# Patient Record
Sex: Male | Born: 1950
Health system: Southern US, Community
[De-identification: ages and names within clinical notes are randomized; demographics above are authoritative.]

## PROBLEM LIST (undated history)

## (undated) DIAGNOSIS — I480 Paroxysmal atrial fibrillation: Secondary | ICD-10-CM

## (undated) DIAGNOSIS — M25512 Pain in left shoulder: Secondary | ICD-10-CM

## (undated) DIAGNOSIS — F0781 Postconcussional syndrome: Secondary | ICD-10-CM

## (undated) DIAGNOSIS — T7840XA Allergy, unspecified, initial encounter: Secondary | ICD-10-CM

## (undated) DIAGNOSIS — M75110 Incomplete rotator cuff tear or rupture of unspecified shoulder, not specified as traumatic: Secondary | ICD-10-CM

## (undated) DIAGNOSIS — H269 Unspecified cataract: Secondary | ICD-10-CM

## (undated) DIAGNOSIS — R011 Cardiac murmur, unspecified: Secondary | ICD-10-CM

## (undated) DIAGNOSIS — E785 Hyperlipidemia, unspecified: Secondary | ICD-10-CM

## (undated) DIAGNOSIS — I10 Essential (primary) hypertension: Secondary | ICD-10-CM

## (undated) DIAGNOSIS — K219 Gastro-esophageal reflux disease without esophagitis: Secondary | ICD-10-CM

## (undated) DIAGNOSIS — R561 Post traumatic seizures: Secondary | ICD-10-CM

## (undated) HISTORY — PX: BICEPS TENDON REPAIR: SHX566

## (undated) HISTORY — PX: NASAL SEPTUM SURGERY: SHX37

## (undated) HISTORY — PX: TONSILECTOMY/ADENOIDECTOMY WITH MYRINGOTOMY: SHX6125

## (undated) HISTORY — DX: Hyperlipidemia, unspecified: E78.5

## (undated) HISTORY — PX: UPPER GASTROINTESTINAL ENDOSCOPY: SHX188

## (undated) HISTORY — DX: Cardiac murmur, unspecified: R01.1

## (undated) HISTORY — DX: Postconcussional syndrome: F07.81

## (undated) HISTORY — PX: CATARACT EXTRACTION: SUR2

## (undated) HISTORY — DX: Paroxysmal atrial fibrillation: I48.0

## (undated) HISTORY — PX: OTHER SURGICAL HISTORY: SHX169

## (undated) HISTORY — DX: Gastro-esophageal reflux disease without esophagitis: K21.9

## (undated) HISTORY — PX: COLONOSCOPY: SHX174

## (undated) HISTORY — DX: Allergy, unspecified, initial encounter: T78.40XA

## (undated) HISTORY — DX: Unspecified cataract: H26.9

---

## 1998-01-08 ENCOUNTER — Ambulatory Visit (HOSPITAL_BASED_OUTPATIENT_CLINIC_OR_DEPARTMENT_OTHER): Admission: RE | Admit: 1998-01-08 | Discharge: 1998-01-08 | Payer: Self-pay | Admitting: Orthopedic Surgery

## 2000-03-29 ENCOUNTER — Ambulatory Visit (HOSPITAL_COMMUNITY): Admission: RE | Admit: 2000-03-29 | Discharge: 2000-03-29 | Payer: Self-pay | Admitting: Anesthesiology

## 2000-03-29 ENCOUNTER — Encounter: Payer: Self-pay | Admitting: Anesthesiology

## 2000-04-13 ENCOUNTER — Encounter: Admission: RE | Admit: 2000-04-13 | Discharge: 2000-07-12 | Payer: Self-pay | Admitting: Anesthesiology

## 2000-05-10 ENCOUNTER — Ambulatory Visit (HOSPITAL_COMMUNITY): Admission: RE | Admit: 2000-05-10 | Discharge: 2000-05-10 | Payer: Self-pay | Admitting: Gastroenterology

## 2000-07-27 ENCOUNTER — Ambulatory Visit (HOSPITAL_COMMUNITY): Admission: RE | Admit: 2000-07-27 | Discharge: 2000-07-27 | Payer: Self-pay | Admitting: Gastroenterology

## 2008-01-02 ENCOUNTER — Ambulatory Visit: Payer: Self-pay | Admitting: Internal Medicine

## 2008-01-02 DIAGNOSIS — I10 Essential (primary) hypertension: Secondary | ICD-10-CM | POA: Insufficient documentation

## 2008-01-02 DIAGNOSIS — E785 Hyperlipidemia, unspecified: Secondary | ICD-10-CM | POA: Insufficient documentation

## 2008-01-03 ENCOUNTER — Encounter: Payer: Self-pay | Admitting: Internal Medicine

## 2008-01-11 ENCOUNTER — Encounter: Payer: Self-pay | Admitting: Internal Medicine

## 2008-03-05 ENCOUNTER — Ambulatory Visit (HOSPITAL_COMMUNITY): Admission: RE | Admit: 2008-03-05 | Discharge: 2008-03-08 | Payer: Self-pay | Admitting: Internal Medicine

## 2009-08-11 ENCOUNTER — Encounter: Payer: Self-pay | Admitting: Internal Medicine

## 2009-08-12 ENCOUNTER — Ambulatory Visit: Payer: Self-pay | Admitting: Internal Medicine

## 2009-08-12 LAB — CONVERTED CEMR LAB
ALT: 15 units/L (ref 0–53)
AST: 21 units/L (ref 0–37)
Albumin: 4.1 g/dL (ref 3.5–5.2)
Alkaline Phosphatase: 45 units/L (ref 39–117)
BUN: 15 mg/dL (ref 6–23)
Basophils Absolute: 0 10*3/uL (ref 0.0–0.1)
Basophils Relative: 0.2 % (ref 0.0–3.0)
Bilirubin Urine: NEGATIVE
Bilirubin, Direct: 0.1 mg/dL (ref 0.0–0.3)
Blood in Urine, dipstick: NEGATIVE
CO2: 32 meq/L (ref 19–32)
Calcium: 9.4 mg/dL (ref 8.4–10.5)
Chloride: 104 meq/L (ref 96–112)
Cholesterol: 313 mg/dL — ABNORMAL HIGH (ref 0–200)
Creatinine, Ser: 1.2 mg/dL (ref 0.4–1.5)
Direct LDL: 151.1 mg/dL
Eosinophils Absolute: 0.1 10*3/uL (ref 0.0–0.7)
Eosinophils Relative: 1.1 % (ref 0.0–5.0)
GFR calc non Af Amer: 66.28 mL/min (ref >60)
Glucose, Bld: 95 mg/dL (ref 70–99)
Glucose, Urine, Semiquant: NEGATIVE
HCT: 45.1 % (ref 39.0–52.0)
HDL: 141 mg/dL (ref >39.00)
Hemoglobin: 14.8 g/dL (ref 13.0–17.0)
Ketones, urine, test strip: NEGATIVE
Lymphocytes Relative: 17.7 % (ref 12.0–46.0)
Lymphs Abs: 1 10*3/uL (ref 0.7–4.0)
MCHC: 32.9 g/dL (ref 30.0–36.0)
MCV: 96.1 fL (ref 78.0–100.0)
Monocytes Absolute: 0.5 10*3/uL (ref 0.1–1.0)
Monocytes Relative: 9.9 % (ref 3.0–12.0)
Neutro Abs: 3.8 10*3/uL (ref 1.4–7.7)
Neutrophils Relative %: 71.1 % (ref 43.0–77.0)
Nitrite: NEGATIVE
PSA: 0.84 ng/mL (ref 0.10–4.00)
Platelets: 200 10*3/uL (ref 150.0–400.0)
Potassium: 4.4 meq/L (ref 3.5–5.1)
Protein, U semiquant: NEGATIVE
RBC: 4.69 M/uL (ref 4.22–5.81)
RDW: 12.8 % (ref 11.5–14.6)
Sodium: 141 meq/L (ref 135–145)
Specific Gravity, Urine: 1.02
TSH: 0.28 microintl units/mL — ABNORMAL LOW (ref 0.35–5.50)
Total Bilirubin: 1.1 mg/dL (ref 0.3–1.2)
Total CHOL/HDL Ratio: 2
Total Protein: 7.2 g/dL (ref 6.0–8.3)
Triglycerides: 77 mg/dL (ref 0.0–149.0)
Urobilinogen, UA: 0.2
VLDL: 15.4 mg/dL (ref 0.0–40.0)
WBC Urine, dipstick: NEGATIVE
WBC: 5.4 10*3/uL (ref 4.5–10.5)
pH: 5.5

## 2009-09-22 ENCOUNTER — Ambulatory Visit: Payer: Self-pay | Admitting: Internal Medicine

## 2009-09-22 DIAGNOSIS — I4891 Unspecified atrial fibrillation: Secondary | ICD-10-CM

## 2009-09-22 DIAGNOSIS — I480 Paroxysmal atrial fibrillation: Secondary | ICD-10-CM | POA: Insufficient documentation

## 2009-09-30 ENCOUNTER — Telehealth: Payer: Self-pay | Admitting: Internal Medicine

## 2009-11-17 ENCOUNTER — Ambulatory Visit: Payer: Self-pay | Admitting: Internal Medicine

## 2009-11-17 ENCOUNTER — Ambulatory Visit: Payer: Self-pay

## 2009-11-17 ENCOUNTER — Ambulatory Visit (HOSPITAL_COMMUNITY): Admission: RE | Admit: 2009-11-17 | Discharge: 2009-11-17 | Payer: Self-pay | Admitting: Cardiology

## 2009-11-17 ENCOUNTER — Encounter: Payer: Self-pay | Admitting: Cardiology

## 2009-11-19 ENCOUNTER — Ambulatory Visit: Payer: Self-pay | Admitting: Cardiology

## 2009-11-24 ENCOUNTER — Telehealth: Payer: Self-pay | Admitting: Internal Medicine

## 2009-12-02 ENCOUNTER — Encounter: Payer: Self-pay | Admitting: Internal Medicine

## 2009-12-02 ENCOUNTER — Ambulatory Visit (HOSPITAL_BASED_OUTPATIENT_CLINIC_OR_DEPARTMENT_OTHER): Admission: RE | Admit: 2009-12-02 | Discharge: 2009-12-02 | Payer: Self-pay | Admitting: Internal Medicine

## 2010-04-15 ENCOUNTER — Ambulatory Visit: Payer: Self-pay | Admitting: Internal Medicine

## 2010-04-15 LAB — CONVERTED CEMR LAB
ALT: 18 units/L (ref 0–53)
AST: 20 units/L (ref 0–37)
Albumin: 4.1 g/dL (ref 3.5–5.2)
Alkaline Phosphatase: 42 units/L (ref 39–117)
Bilirubin, Direct: 0.1 mg/dL (ref 0.0–0.3)
Total Bilirubin: 0.7 mg/dL (ref 0.3–1.2)
Total Protein: 6.6 g/dL (ref 6.0–8.3)

## 2010-04-23 ENCOUNTER — Encounter: Payer: Self-pay | Admitting: Internal Medicine

## 2010-05-12 ENCOUNTER — Encounter: Payer: Self-pay | Admitting: Internal Medicine

## 2010-06-08 ENCOUNTER — Ambulatory Visit (HOSPITAL_BASED_OUTPATIENT_CLINIC_OR_DEPARTMENT_OTHER): Admission: RE | Admit: 2010-06-08 | Discharge: 2010-06-08 | Payer: Self-pay | Admitting: Otolaryngology

## 2010-07-13 ENCOUNTER — Ambulatory Visit: Payer: Self-pay | Admitting: Internal Medicine

## 2010-09-06 LAB — CONVERTED CEMR LAB
Free T4: 0.7 ng/dL (ref 0.6–1.6)
T3, Free: 2.4 pg/mL (ref 2.3–4.2)

## 2010-09-08 NOTE — Assessment & Plan Note (Signed)
Summary: NP6/ AFIB/ APPT IS 4:15/ GD   Visit Type:  Initial Consult Primary Provider:  Birdie Sons MD  CC:  atrial fibrillation.  History of Present Illness: The patient is seen for the evaluation of atrial fibrillation.  He is a very healthy and active physician.  In 2001 the patient had a nuclear exercise test.  He exercised 15 mets.  There was 1 mm of ST depression.  The nuclear images were normal.  Ejection fraction was 62%.  A standard treadmill in 2005 for insurance purposes.  Exercise level was quite high.  EKGs revealed no significant abnormalities.   Patient has a high total cholesterol.  In the past his HDL was high and LDL was high.  A Lipo-Med was done showing large particle HDL (good) and large particle LDL (good).  The total LDL was increased.  At that time I chose not to start medications.  The patient has done well.  There is no chest pain.  Is not having any shortness of breath.  Within the past few months the patient has had several brief episodes of atrial fibrillation.  He has documented this with rhythm strips.  There is no obvious precipitating factor.  All of the episodes have occured  when he was at rest.  On several occasions he came home from work and relaxed with either a cup of water or glass of wine and developed atrial fibrillation.  He drinks one glass of wine approximately 5 evenings per week.  There is no relationship between this and the episodes of atrial fibrillation.  He has gone to sleep with the atrial fib and it is gone in the morning.  Recent labs have looked good.  His TSH is slightly below the normal range.  There is question of sleep apnea.  Current Medications (verified): 1)  Fish Oil 1000 Mg  Caps (Omega-3 Fatty Acids) .... Once Daily 2)  Aspirin 81 Mg Tabs (Aspirin) .... Once Daily 3)  Crestor 5 Mg  Tabs (Rosuvastatin Calcium) .... Take One Tablet By Mouth Every Other Day  Allergies (verified): 1)  ! Simvastatin  Past History:  Past Medical  History: Hyperlipidemia...high HDL.... high LDL... Lipo-med.. 2002.. very significant large HDL (good), LDL particle size large (good), low VLDL Hypertension Atrial fibrillation Hx of Inguinal herniorrhaphy, right Hx ofTonsillectomy Hx of Vasectomy--1987 Hx of knee arthroscopy, right and left 1990s Nuclear stress.... 2001...Marland Kitchen 1 mm inferior ST depression by EKG.... nuclear images normal... ejection fraction 62%.... 15 METs of exercise Question sleep apnea EF 55-60%... echo.Marland Kitchen November 17, 2009 Mitral valve   flat closure..... no definite mitral prolapse.... very mild MR.... echo... November 17, 2009 MR   very mild....echo  11/2009 AI    mild  ...echo.Marland Kitchen.11/2009 Thyroid.... TSH .Marland Kitchen slightly below normal range.... 2011 Family history coronary artery disease  Family History: Reviewed history from 09/22/2009 and no changes required. brother MI---age early 69s (smoker) father---MI age 38 (smoker)--renal cell CA (age 34) mother-helathy at 60 yo  Social History: Reviewed history from 01/02/2008 and no changes required. 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  All other systems are reviewed and are negative.  Vital Signs:  Patient profile:   60 year old male Height:      73.25 inches Weight:  184 pounds BMI:     24.20 Pulse rate:   65 / minute BP sitting:   126 / 82  (left arm) Cuff size:   regular  Vitals Entered By: Hardin Negus, RMA (November 19, 2009 4:12 PM)  Physical Exam  General:  patient is stable. Head:  head is atraumatic. Eyes:  no xanthelasma. Neck:  no jugular venous distention.  No carotid bruits. Chest Wall:  no chest wall tenderness.  There is slight prominence of one of his anterior  ribs on the right. Lungs:  lungs are clear.  Respiratory effort is nonlabored. Heart:  cardiac exam reveals S1-S2.  No significant murmurs heard. Abdomen:  abdomen is soft. Msk:  no musculoskeletal deformities. Extremities:  no peripheral edema. Skin:  no skin rashes. Psych:  patient is oriented to person time and place.  Affect is normal.   Impression & Recommendations:  Problem # 1:  * TSH LOW The patient's TSH is slightly below the normal range on 2 recent occasions.  I have chosen to send a free T3 and free T4 today.  This is done specifically because of his history of atrial fibrillation.  Problem # 2:  * FLAT CLOSURE OF THE MITRAL VALVE Echocardiogram was done on November 17, 2009.  There is excellent LV function.  There is flat  closure the mitral valve but no definite mitral valve prolapse.  There is insignificant mild MR.  There is mild aortic insufficiency with 3 aortic valve cusps.  The left atrium is 36 mm.  The aorta is 36 mm.  There is trace tricuspid regurgitation.  None of  these findings represent a significant abnormality.  Problem # 3:  * QUESTION SLEEP APNEA The patient says that he has had increased snoring.  I've asked him to look further into an evaluation for sleep apnea.  This can certainly be a risk factor for atrial fibrillation.  Problem # 4:  HYPERLIPIDEMIA (ICD-272.4)  His updated medication list for this problem includes:    Crestor 5 Mg Tabs (Rosuvastatin calcium) .Marland Kitchen... Take one tablet by mouth every other day The patient has very unusual lipids.  He has high HDL with large particle HDL.  He has increased LDL with large particle LDL.  He has been started on Crestor because of his family history.  Problem # 5:  ATRIAL FIBRILLATION (ICD-427.31)  His updated medication list for this problem includes:    Aspirin 81 Mg Tabs (Aspirin) ..... Once daily  Orders: TLB-T3, Free (Triiodothyronine) ([id number]) TLB-T4 (Thyrox), Free [phone]) The patient has  lone atrial fibrillation .  He has had several brief episodes.  He can feel the irregular beat but it does not cause him any problems.  He has gone to sleep with atrial fib on one or 2 occasions.  There is no obvious cause at this point.  It does not appear to be related to excess caffeine, cold medications, alcohol.  There is no significant structural heart disease.  His left atrial size is normal.  There is no indication for Coumadin.  The patient will continue normal activities.  If he has more episodes we will consider adding p.r.n. beta blocker or calcium blocker. He will remain on aspirin. He and I discussed many potential options that would follow but none of these are needed at this time. EKG is done today and reviewed by me.  There is normal sinus rhythm with a normal EKG.  Appended Document: NP6/ AFIB/ APPT IS 4:15/ GD Please put Dr.  Nicholes on call back list to see me in April 2012.  Appended Document: NP6/ AFIB/ APPT IS 4:15/ GD reminder put in IDX

## 2010-09-08 NOTE — Assessment & Plan Note (Signed)
Summary: cpx//ccm---PT RSC (BMP) // RS   Vital Signs:  Patient profile:   60 year old male Height:      73.25 inches Weight:      179 pounds BMI:     23.54 Pulse rate:   60 / minute Resp:     12 per minute BP sitting:   128 / 86  (left arm)  Vitals Entered By: Gladis Riffle, RN (September 22, 2009 10:07 AM) CC: cpx, labs done Is Patient Diabetic? No Comments had weight loss on simvastatin so stopped, weight returned to his normal   CC:  cpx and labs done.  History of Present Illness: cpx  see A/P  Preventive Screening-Counseling & Management  Alcohol-Tobacco     Smoking Status: never  Current Medications (verified): 1)  Fish Oil 1000 Mg  Caps (Omega-3 Fatty Acids) .... Once Daily 2)  Aspirin 81 Mg Tabs (Aspirin) .... Once Daily  Allergies (verified): 1)  ! Simvastatin  Past History:  Past Medical History: Hyperlipidemia Hypertension Atrial fibrillation  Family History: brother MI---age early 52s (smoker) father---MI age 52 (smoker)--renal cell CA (age 54) mother-helathy at 62 yo  Physical Exam  General:  alert and well-developed.   Head:  normocephalic and atraumatic.   Eyes:  pupils equal and pupils round.   Ears:  R ear normal and L ear normal.   Nose:  no external deformity and no external erythema.   Mouth:  good dentition, no gingival abnormalities, and no dental plaque.   Neck:  No deformities, masses, or tenderness noted. Chest Wall:  No deformities, masses, tenderness or gynecomastia noted. Lungs:  normal respiratory effort, no intercostal retractions, no accessory muscle use, and normal breath sounds.   Heart:  normal rate, regular rhythm, no murmur, no gallop, no rub, and no JVD.   Abdomen:  Bowel sounds positive,abdomen soft and non-tender without masses, organomegaly or hernias noted. Msk:  No deformity or scoliosis noted of thoracic or lumbar spine.   Pulses:  R radial normal, R carotid normal, L radial normal, and L carotid normal.     Extremities:  No clubbing, cyanosis, edema, or deformity noted with normal full range of motion of all joints.   Neurologic:  alert & oriented X3, cranial nerves II-XII intact, and gait normal.   Skin:  turgor normal and color normal.   Psych:  normally interactive and good eye contact.     Impression & Recommendations:  Problem # 1:  PREVENTIVE HEALTH CARE (ICD-V70.0) health maintenance issues are up-to-date.  Problem # 2:  ATRIAL FIBRILLATION (ICD-427.31) he has a rhythm strip documenting AFIb I had a long discussion with Dr. Gypsy Balsam.  He gives a great history for paroxysms of atrial fibrillation.  He is unable to relate these paroxysms to any behavior, caffeine intake.  He does not use over-the-counter sinus medications.  I think he needs further evaluation.  Robynn Pane needs an echocardiogram.  He asked about a stress test.  I don't think this will be helpful in his case.  He exercises at least 40-45 minutes 3 times a week.  He gets his heart rate up to 140 beats per minutefor at least 30 minutes.  I suspect he has lone atrial fibrillation.  I'd like him to be evaluated by cardiology.  He and I discussed the use of aspirin versus Coumadin.  I think aspirin for the time being is okay.  He also had questions about A. fib ablation.  At this time I don't think he is a  candidate for that. echo Dr. Myrtis Ser His updated medication list for this problem includes:    Aspirin 81 Mg Tabs (Aspirin) ..... Once daily  Problem # 3:  HYPERTENSION (ICD-401.9) see serial assessment form. no medications necessary at this time. BP today: 128/86 Prior BP: 132/90 (01/02/2008)  Labs Reviewed: K+: 4.4 (08/12/2009) Creat: : 1.2 (08/12/2009)   Chol: 313 (08/12/2009)   HDL: 141.00 (08/12/2009)   TG: 77.0 (08/12/2009)  Problem # 4:  HYPERLIPIDEMIA (ICD-272.4) I previously treated the patient with simvastatin.  While he was on that he had some vague achiness but more importantly he lost about 10 pounds.  He also  noticed subjective decreased strength.  When he stopped the medication his symptoms resolved.  He has an interesting lipid profile.  His cholesterol is remarkably high but his HDL is tremendously high.  If if a decision were to be made based just on his cholesterol profile I would not to treat.  However, he has a significant family history of coronary artery disease and therefore I think he should be treated.  I will order a lipoprotein electrophoresis.  We'll start Crestor 5 mg every other day.  Samples are given to him.  Side effects discussed.  Orders: T-Lipoprotien Electrophoresis ([id number])  Complete Medication List: 1)  Fish Oil 1000 Mg Caps (Omega-3 fatty acids) .... Once daily 2)  Aspirin 81 Mg Tabs (Aspirin) .... Once daily 3)  Crestor 5 Mg Tabs (Rosuvastatin calcium) .... Take one tablet by mouth every other day  Patient Instructions: 1)  lipids 272.4 2)  liver 995.2 in 6 weeks Prescriptions: CRESTOR 5 MG  TABS (ROSUVASTATIN CALCIUM) Take one tablet by mouth every other day  #90 x 3   Entered and Authorized by:   Birdie Sons MD   Signed by:   Birdie Sons MD on 09/23/2009   Method used:   Samples Given   RxID:   [id number]    Immunization History:  Tetanus/Td Immunization History:    Tetanus/Td:  historical (06/09/2009)  Influenza Immunization History:    Influenza:  historical (04/11/2009)

## 2010-09-08 NOTE — Progress Notes (Signed)
Summary: ? obstructive sleep apnea  Phone Note Call from Patient   Summary of Call: Nicholas Perry followed by cardiology for PAF, Family noting loud snore. Cardiology has raised possible connection between AF and OSA.  Hx tonsilectomy/ adenoidectomy. Always lean and otherwise in good health. We discussed sleep apnea evaluation and I will order split protocol sleep study based on above. Cell- [id number]  Initial call taken by: Waymon Budge MD,  November 24, 2009 5:29 PM  Follow-up for Phone Call        [facility] for pt to return my call to arrange for sleep study. [name]  November 25, 2009 9:14 AM Pt returned my call and we scheduled split night for May 26th. Pt was added to the cancellation list for the week of April 25th b/c pt has all this week off. Hopefully we can get him in earlier. Sleep packet faxed to pt's home fax. Pt aware to call me if he has any questions or concerns. [name]  November 25, 2009 10:19 AM

## 2010-09-08 NOTE — Procedures (Signed)
Summary: Colonoscopy, [facility]/Dr. Sharrell Ku  Colonoscopy, [facility]/Dr. Sharrell Ku   Imported By: Maryln Gottron 09/11/2009 13:22:11  _____________________________________________________________________  External Attachment:    Type:   Image     Comment:   External Document

## 2010-09-08 NOTE — Consult Note (Signed)
Summary: [facility]   [facility]   Imported By: Maryln Gottron 05/25/2010 09:19:53  _____________________________________________________________________  External Attachment:    Type:   Image     Comment:   External Document  Appended Document: [facility]  i have spoken with dr. Janyth Contes surgery

## 2010-09-08 NOTE — Progress Notes (Signed)
Summary: cardiology referral  Phone Note Call from Patient   Caller: Patient Call For: Birdie Sons MD Summary of Call: Pt is calling to ask about his referral for a cardiac work up with Dr. Myrtis Ser, and a echo?? [phone] Initial call taken by: Lynann Beaver CMA,  September 30, 2009 3:21 PM

## 2010-09-10 NOTE — Assessment & Plan Note (Signed)
Summary: fu per pt/njr   Vital Signs:  Patient profile:   60 year old male Height:      73.25 inches Weight:      182 pounds Temp:     98.6 degrees F Pulse rate:   72 / minute BP sitting:   122 / 98  (left arm)  Vitals Entered By: Jeremy Johann CMA (July 13, 2010 10:00 AM) CC: f/u lab results   Primary Care Zaelyn Noack:  Birdie Sons MD  CC:  f/u lab results.  History of Present Illness:  Follow-Up Visit      This is a 60 year old man who presents for Follow-up visit.  The patient denies chest pain and palpitations.  Since the last visit the patient notes no new problems or concerns.  The patient reports taking meds as prescribed.  When questioned about possible medication side effects, the patient notes none.    All other systems reviewed and were negative   Current Problems (verified): 1)  Flat Closure of The Mitral Valve  () 2)  Atrial Fibrillation  (ICD-427.31) 3)  Preventive Health Care  (ICD-V70.0) 4)  Hypertension  (ICD-401.9) 5)  Hyperlipidemia  (ICD-272.4)  Current Medications (verified): 1)  Fish Oil 1000 Mg  Caps (Omega-3 Fatty Acids) .... Once Daily 2)  Aspirin 81 Mg Tabs (Aspirin) .... Once Daily 3)  Crestor 5 Mg  Tabs (Rosuvastatin Calcium) .... Take One Tablet By Mouth Every Other Day  Allergies (verified): 1)  ! Simvastatin  Past History:  Past Medical History: Last updated: 11/19/2009 Hyperlipidemia...high HDL.... high LDL... Lipo-med.. 2002.. very significant large HDL (good), LDL particle size large (good), low VLDL Hypertension Atrial fibrillation Hx of Inguinal herniorrhaphy, right Hx ofTonsillectomy Hx of Vasectomy--1987 Hx of knee arthroscopy, right and left 1990s Nuclear stress.... 2001...Marland Kitchen 1 mm inferior ST depression by EKG.... nuclear images normal... ejection fraction 62%.... 15 METs of exercise Question sleep apnea EF 55-60%... echo.Marland Kitchen November 17, 2009 Mitral valve   flat closure..... no definite mitral prolapse.... very mild MR....  echo... November 17, 2009 MR   very mild....echo  11/2009 AI    mild  ...echo.Marland Kitchen.11/2009 Thyroid.... TSH .Marland Kitchen slightly below normal range.... 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 HEENT exam atraumatic, normocephalic cardiac exam S1-S2 are regular. Extremities no clubbing cyanosis or edema.   Impression & Recommendations:  Problem # 1:  ATRIAL FIBRILLATION (ICD-427.31) by history. He has had cardiology evaluation and has had echocardiogram. Note borderline low TSH. His updated medication list for this problem includes:    Aspirin 81 Mg Tabs (Aspirin) ..... Once daily  Problem # 2:  HYPERLIPIDEMIA (ICD-272.4) patient has a remarkably elevated HDL. It's very unclear whether he should be treated or not. He, of course, would prefer not to be treated. I think it's fine for him to stop the Crestor we will then do alipo medprofile. This was discussed with him in detail. His updated medication list for this problem includes:    Crestor 5 Mg  Tabs (Rosuvastatin calcium) .Marland Kitchen... Take one tablet by mouth every other day  Labs Reviewed: SGOT: 20 (04/15/2010)   SGPT: 18 (04/15/2010)   HDL:141.00 (08/12/2009)  Chol:313 (08/12/2009)  Trig:77.0 (08/12/2009)  Complete Medication List: 1)  Fish Oil 1000 Mg Caps (Omega-3 fatty acids) .... Once daily 2)  Aspirin  81 Mg Tabs (Aspirin) .... Once daily 3)  Crestor 5 Mg Tabs (Rosuvastatin calcium) .... Take one tablet by mouth every other day   Orders Added: 1)  Est. Patient Level III [16109]

## 2010-10-21 LAB — POCT HEMOGLOBIN-HEMACUE: Hemoglobin: 15.2 g/dL (ref 13.0–17.0)

## 2010-12-25 NOTE — Procedures (Signed)
[facility]  Patient:    Nicholas Perry, Nicholas Perry                            MRN: [id number] Proc. Date: 05/10/00 Adm. Date:  [id number] Attending:  Deneen Harts CC:         Pricilla Handler, M.D., [facility] (PERSONAL)   Procedure Report  PROCEDURE:  Colonoscopy.  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  No family history of colorectal neoplasia.  No personal history of altered bowel movements.  DESCRIPTION OF PROCEDURE:  After reviewing the nature of the procedure with the patient including potential risks and complications, and after discussing alternative methods of diagnosis and treatment, informed consent was signed.  The patient was premedicated receiving IV sedation administered prior to and during the course of the procedure totalling Versed 8 mg, fentanyl 100 mcg.  Using an Olympus pediatric PCF-140L video colonoscope, rectum was intubated after normal digital examination revealing no evidence of perianal or intrarectal pathology.  The scope was advanced around the colon, encountering difficulty only at the level of the splenic flexure which was acutely angulated.  The remainder of the colon was easily intubated.  The cecum identified by the appendiceal orifice and ileocecal valve.  The preparation was excellent throughout.  The scope was slowly withdrawn with careful inspection throughout the entire colon in a retrograde manner including retroflex view in the rectal vault.  The only abnormalities noted were internal hemorrhoids on retroflex view.  These were noninflamed.  The colon was decompressed and scope withdrawn.  The patient tolerated the procedure without difficulty being maintained on Datascope monitor and low-flow oxygen throughout.  Time 2, technical 2, preparation 1, total score = 5.  ASSESSMENT: 1. Internal  hemorrhoids - small, probable cause of hematochezia. 2. No colorectal neoplasia.  RECOMMENDATIONS: 1. Rectal care p.r.n. 2. Repeat colonoscopy 10 years. 3. Annual Hemoccult. DD:  05/10/00 TD:  05/10/00 Job: [id number]

## 2010-12-25 NOTE — H&P (Signed)
[facility]  Patient:    Nicholas Perry, Paparella                         MRN: [id number] Adm. 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  The patient stated that he was doing well up until shortly after a skiing trip in February 2001 when he developed a deep, nagging discomfort which radiates to the sole of his left foot.  He had been skiing in heavy snow.  Since then, he noted that his discomfort is most pronounced after he has been up on his foot for long periods of time and seems to improve with exercise temporarily only to recur a couple of hours later.  Occasionally, in the morning, he will have discomfort, but it is more pronounced later in the day.  He denies any weakness of his foot.  He denied any bowel or bladder changes.  He has tried Vioxx from Dr. Eulah Pont which was not very helpful.  He stated that the pain has a quivering, prickly, hot, tingling, annoying, nagging type discomfort to it.  He does exercise regularly at least three times a week and does do stair stepper as well as elliptical machines and weights.  He has never had any previous type symptoms associated.  PAST MEDICAL HISTORY:  The patient does have a previous history of surgery on his knees bilaterally for meniscal tears.  The left one being associated with some arthritic changes.  The left one was performed in 1999.  The right in 1995.  CURRENT MEDICATIONS:  One aspirin a day, Pepcid occasionally.  ALLERGIES:  No known allergies.  PAST SURGICAL HISTORY:  Significant for tonsillectomy and right inguinal hernia repair as a child with left knee surgery in 1999 and right knee surgery in 1995.  FAMILY HISTORY:  Positive for coronary artery disease.  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  He does not smoke cigarettes and rarely  drinks alcohol.  REVIEW OF SYSTEMS:  Unrevealing.  PHYSICAL EXAMINATION:  VITAL SIGNS:  Blood pressure 118/78, heart rate 68, respiratory rate 16, O2 saturation 98%, pain level 5/10, temperature 97.7.  HEENT:  Head:  Normocephalic, atraumatic.  NEUROLOGIC:  Cranial nerves II-XII were grossly intact.  Deep tendon reflexes were symmetric in the upper and lower extremities with downgoing toes.  Motor was 5/5 with symmetric bulk and tone.  Sensory was intact to vibratory sense and pin prick.  Tinels sign at the left ankle was negative.  Pressure over the tibial nerve at the popliteal fossa elicited discomfort which tended to radiate more to the lateral aspect of the calf and down to the sole of the foot.  LABORATORY DATA:  An MRI was performed on August 21 which was interpreted by Dr. Frazier Richards as showing minimal disk protrusion to the right at L5-S1 with mild degenerative facet arthritic changes, but no left-sided changes.  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  DISPOSITION:  I discussed with the patient the fact that it does not look like his problem is necessarily coming from  his back and may be an early entrapment syndrome which he was relieved to find.  I advocated that we use pyridoxine. I also advocated we go ahead and do nerve conduction studies just to verify that it looks like it is coming from that area to make sure that there is no evidence of a disk problem that was not picked up by the MRI.  We will go ahead and schedule these in the ensuing weeks.  He was reassured today and encouraged to pay close attention to whether certain exercises exacerbated his discomfort. DD:  04/13/00 TD:  04/13/00 Job: [id number]

## 2011-05-07 LAB — LIPID PANEL
HDL: 123
Total CHOL/HDL Ratio: 2
Triglycerides: 52

## 2011-05-07 LAB — HEPATIC FUNCTION PANEL
ALT: 21
AST: 22
Albumin: 4.1
Indirect Bilirubin: 0.9
Total Protein: 6.7

## 2011-05-07 LAB — BASIC METABOLIC PANEL
BUN: 16
Chloride: 103
Potassium: 4.5

## 2011-05-07 LAB — HIGH SENSITIVITY CRP: CRP, High Sensitivity: 0.4

## 2011-05-07 LAB — CK: Total CK: 105

## 2011-05-07 LAB — C-REACTIVE PROTEIN: CRP: 0 — ABNORMAL LOW (ref <0.6)

## 2013-04-01 ENCOUNTER — Encounter (HOSPITAL_COMMUNITY): Payer: Self-pay | Admitting: Anesthesiology

## 2013-12-14 ENCOUNTER — Emergency Department (HOSPITAL_COMMUNITY): Payer: BC Managed Care – PPO

## 2013-12-14 ENCOUNTER — Inpatient Hospital Stay (HOSPITAL_COMMUNITY)
Admission: EM | Admit: 2013-12-14 | Discharge: 2013-12-17 | DRG: 100 | Disposition: A | Discharge status: Home / Self Care | Payer: BC Managed Care – PPO | Attending: General Surgery | Admitting: General Surgery

## 2013-12-14 ENCOUNTER — Encounter (HOSPITAL_COMMUNITY): Payer: Self-pay | Admitting: Neurology

## 2013-12-14 ENCOUNTER — Inpatient Hospital Stay (HOSPITAL_COMMUNITY): Payer: BC Managed Care – PPO

## 2013-12-14 DIAGNOSIS — T754XXA Electrocution, initial encounter: Secondary | ICD-10-CM

## 2013-12-14 DIAGNOSIS — E785 Hyperlipidemia, unspecified: Secondary | ICD-10-CM | POA: Diagnosis present

## 2013-12-14 DIAGNOSIS — IMO0002 Reserved for concepts with insufficient information to code with codable children: Secondary | ICD-10-CM | POA: Diagnosis present

## 2013-12-14 DIAGNOSIS — Z87898 Personal history of other specified conditions: Secondary | ICD-10-CM | POA: Diagnosis present

## 2013-12-14 DIAGNOSIS — S0990XA Unspecified injury of head, initial encounter: Secondary | ICD-10-CM

## 2013-12-14 DIAGNOSIS — I4891 Unspecified atrial fibrillation: Secondary | ICD-10-CM | POA: Diagnosis present

## 2013-12-14 DIAGNOSIS — T23029A Burn of unspecified degree of unspecified single finger (nail) except thumb, initial encounter: Secondary | ICD-10-CM | POA: Diagnosis present

## 2013-12-14 DIAGNOSIS — R569 Unspecified convulsions: Principal | ICD-10-CM

## 2013-12-14 DIAGNOSIS — I498 Other specified cardiac arrhythmias: Secondary | ICD-10-CM | POA: Diagnosis present

## 2013-12-14 DIAGNOSIS — W11XXXA Fall on and from ladder, initial encounter: Secondary | ICD-10-CM | POA: Diagnosis present

## 2013-12-14 DIAGNOSIS — W868XXA Exposure to other electric current, initial encounter: Secondary | ICD-10-CM | POA: Diagnosis present

## 2013-12-14 DIAGNOSIS — R561 Post traumatic seizures: Secondary | ICD-10-CM | POA: Diagnosis present

## 2013-12-14 DIAGNOSIS — R32 Unspecified urinary incontinence: Secondary | ICD-10-CM | POA: Diagnosis present

## 2013-12-14 DIAGNOSIS — W860XXA Exposure to domestic wiring and appliances, initial encounter: Secondary | ICD-10-CM | POA: Diagnosis present

## 2013-12-14 DIAGNOSIS — T24019A Burn of unspecified degree of unspecified thigh, initial encounter: Secondary | ICD-10-CM

## 2013-12-14 DIAGNOSIS — S060X9A Concussion with loss of consciousness of unspecified duration, initial encounter: Secondary | ICD-10-CM | POA: Diagnosis present

## 2013-12-14 DIAGNOSIS — T2005XA Burn of unspecified degree of scalp [any part], initial encounter: Secondary | ICD-10-CM

## 2013-12-14 DIAGNOSIS — W19XXXA Unspecified fall, initial encounter: Secondary | ICD-10-CM

## 2013-12-14 DIAGNOSIS — I10 Essential (primary) hypertension: Secondary | ICD-10-CM | POA: Diagnosis present

## 2013-12-14 DIAGNOSIS — G9340 Encephalopathy, unspecified: Secondary | ICD-10-CM

## 2013-12-14 DIAGNOSIS — Z8249 Family history of ischemic heart disease and other diseases of the circulatory system: Secondary | ICD-10-CM

## 2013-12-14 DIAGNOSIS — R413 Other amnesia: Secondary | ICD-10-CM | POA: Diagnosis present

## 2013-12-14 HISTORY — DX: Post traumatic seizures: R56.1

## 2013-12-14 LAB — I-STAT ARTERIAL BLOOD GAS, ED
Bicarbonate: 25.5 mEq/L — ABNORMAL HIGH (ref 20.0–24.0)
O2 SAT: 100 %
PCO2 ART: 41.9 mmHg (ref 35.0–45.0)
PO2 ART: 246 mmHg — AB (ref 80.0–100.0)
Patient temperature: 98.6
TCO2: 27 mmol/L (ref 0–100)
pH, Arterial: 7.392 (ref 7.350–7.450)

## 2013-12-14 LAB — CBC WITH DIFFERENTIAL/PLATELET
BASOS ABS: 0 10*3/uL (ref 0.0–0.1)
Basophils Relative: 1 % (ref 0–1)
EOS PCT: 0 % (ref 0–5)
Eosinophils Absolute: 0 10*3/uL (ref 0.0–0.7)
HEMATOCRIT: 40.8 % (ref 39.0–52.0)
HEMOGLOBIN: 13.6 g/dL (ref 13.0–17.0)
LYMPHS ABS: 1.1 10*3/uL (ref 0.7–4.0)
LYMPHS PCT: 18 % (ref 12–46)
MCH: 31 pg (ref 26.0–34.0)
MCHC: 33.3 g/dL (ref 30.0–36.0)
MCV: 92.9 fL (ref 78.0–100.0)
MONO ABS: 0.5 10*3/uL (ref 0.1–1.0)
MONOS PCT: 9 % (ref 3–12)
NEUTROS ABS: 4.4 10*3/uL (ref 1.7–7.7)
Neutrophils Relative %: 72 % (ref 43–77)
Platelets: 203 10*3/uL (ref 150–400)
RBC: 4.39 MIL/uL (ref 4.22–5.81)
RDW: 12.9 % (ref 11.5–15.5)
WBC: 6.1 10*3/uL (ref 4.0–10.5)

## 2013-12-14 LAB — PREPARE FRESH FROZEN PLASMA
UNIT DIVISION: 0
Unit division: 0

## 2013-12-14 LAB — TYPE AND SCREEN
ABO/RH(D): O POS
ANTIBODY SCREEN: NEGATIVE
UNIT DIVISION: 0
UNIT DIVISION: 0

## 2013-12-14 LAB — TROPONIN I: Troponin I: <0.3 ng/mL (ref <0.30)

## 2013-12-14 LAB — I-STAT CHEM 8, ED
BUN: 20 mg/dL (ref 6–23)
CALCIUM ION: 1.17 mmol/L (ref 1.13–1.30)
CHLORIDE: 105 meq/L (ref 96–112)
Creatinine, Ser: 1.4 mg/dL — ABNORMAL HIGH (ref 0.50–1.35)
GLUCOSE: 83 mg/dL (ref 70–99)
HCT: 43 % (ref 39.0–52.0)
Hemoglobin: 14.6 g/dL (ref 13.0–17.0)
Potassium: 4.2 mEq/L (ref 3.7–5.3)
Sodium: 143 mEq/L (ref 137–147)
TCO2: 24 mmol/L (ref 0–100)

## 2013-12-14 LAB — CK TOTAL AND CKMB (NOT AT ARMC)
CK, MB: 4 ng/mL (ref 0.3–4.0)
RELATIVE INDEX: 2.8 — AB (ref 0.0–2.5)
Total CK: 144 U/L (ref 7–232)

## 2013-12-14 LAB — COMPREHENSIVE METABOLIC PANEL
ALBUMIN: 3.7 g/dL (ref 3.5–5.2)
ALK PHOS: 49 U/L (ref 39–117)
ALT: 19 U/L (ref 0–53)
AST: 24 U/L (ref 0–37)
BILIRUBIN TOTAL: 0.4 mg/dL (ref 0.3–1.2)
BUN: 20 mg/dL (ref 6–23)
CHLORIDE: 107 meq/L (ref 96–112)
CO2: 26 meq/L (ref 19–32)
CREATININE: 1.2 mg/dL (ref 0.50–1.35)
Calcium: 9.1 mg/dL (ref 8.4–10.5)
GFR calc Af Amer: 74 mL/min — ABNORMAL LOW (ref >90)
GFR, EST NON AFRICAN AMERICAN: 64 mL/min — AB (ref >90)
Glucose, Bld: 84 mg/dL (ref 70–99)
POTASSIUM: 4.5 meq/L (ref 3.7–5.3)
Sodium: 146 mEq/L (ref 137–147)
Total Protein: 6.5 g/dL (ref 6.0–8.3)

## 2013-12-14 LAB — I-STAT CG4 LACTIC ACID, ED: LACTIC ACID, VENOUS: 1.06 mmol/L (ref 0.5–2.2)

## 2013-12-14 LAB — I-STAT TROPONIN, ED: TROPONIN I, POC: 0 ng/mL (ref 0.00–0.08)

## 2013-12-14 LAB — ABO/RH: ABO/RH(D): O POS

## 2013-12-14 LAB — MRSA PCR SCREENING: MRSA by PCR: NEGATIVE

## 2013-12-14 MED ORDER — SUCCINYLCHOLINE CHLORIDE 20 MG/ML IJ SOLN
INTRAMUSCULAR | Status: AC | PRN
  Administered 2013-12-14: 100 mg via INTRAVENOUS

## 2013-12-14 MED ORDER — FENTANYL CITRATE 0.05 MG/ML IJ SOLN
100.0000 ug | Freq: Once | INTRAMUSCULAR | Status: AC
  Administered 2013-12-14: 100 ug via INTRAVENOUS

## 2013-12-14 MED ORDER — LEVETIRACETAM 500 MG/5ML IV SOLN: 500.0000 mg | Freq: Two times a day (BID) | INTRAVENOUS | Status: DC

## 2013-12-14 MED ORDER — FENTANYL CITRATE 0.05 MG/ML IJ SOLN
INTRAMUSCULAR | Status: AC
  Filled 2013-12-14: qty 2

## 2013-12-14 MED ORDER — PANTOPRAZOLE SODIUM 40 MG IV SOLR
40.0000 mg | Freq: Every day | INTRAVENOUS | Status: DC
  Administered 2013-12-14 – 2013-12-15 (×2): 40 mg via INTRAVENOUS
  Filled 2013-12-14 (×4): qty 40

## 2013-12-14 MED ORDER — ETOMIDATE 2 MG/ML IV SOLN
INTRAVENOUS | Status: AC | PRN
  Administered 2013-12-14: 20 mg via INTRAVENOUS

## 2013-12-14 MED ORDER — LORAZEPAM 2 MG/ML IJ SOLN
INTRAMUSCULAR | Status: AC
  Filled 2013-12-14: qty 1

## 2013-12-14 MED ORDER — BIOTENE DRY MOUTH MT LIQD: 15.0000 mL | Freq: Four times a day (QID) | OROMUCOSAL | Status: DC

## 2013-12-14 MED ORDER — SODIUM CHLORIDE 0.9 % IV SOLN
1000.0000 mg | INTRAVENOUS | Status: AC
  Administered 2013-12-14: 1000 mg via INTRAVENOUS
  Filled 2013-12-14: qty 10

## 2013-12-14 MED ORDER — SODIUM CHLORIDE 0.9 % IV SOLN
10.0000 ug/h | INTRAVENOUS | Status: DC
  Filled 2013-12-14: qty 50

## 2013-12-14 MED ORDER — ONDANSETRON HCL 4 MG/2ML IJ SOLN: 4.0000 mg | Freq: Four times a day (QID) | INTRAMUSCULAR | Status: DC | PRN

## 2013-12-14 MED ORDER — PROPOFOL 10 MG/ML IV EMUL
5.0000 ug/kg/min | Freq: Once | INTRAVENOUS | Status: DC
  Administered 2013-12-14: 50 ug/kg/min via INTRAVENOUS

## 2013-12-14 MED ORDER — LORAZEPAM 2 MG/ML IJ SOLN
INTRAMUSCULAR | Status: AC | PRN
  Administered 2013-12-14: 2 mg via INTRAVENOUS

## 2013-12-14 MED ORDER — SODIUM CHLORIDE 0.9 % IV BOLUS (SEPSIS)
500.0000 mL | Freq: Once | INTRAVENOUS | Status: AC
  Administered 2013-12-14: 500 mL via INTRAVENOUS

## 2013-12-14 MED ORDER — IOHEXOL 300 MG/ML  SOLN
100.0000 mL | Freq: Once | INTRAMUSCULAR | Status: AC | PRN
  Administered 2013-12-14: 100 mL via INTRAVENOUS

## 2013-12-14 MED ORDER — PROPOFOL 10 MG/ML IV EMUL
INTRAVENOUS | Status: AC
  Filled 2013-12-14: qty 100

## 2013-12-14 MED ORDER — SODIUM CHLORIDE 0.9 % IV SOLN
INTRAVENOUS | Status: DC
  Administered 2013-12-14 – 2013-12-15 (×3): via INTRAVENOUS
  Administered 2013-12-16: 1 mL via INTRAVENOUS

## 2013-12-14 MED ORDER — PROPOFOL 10 MG/ML IV EMUL
5.0000 ug/kg/min | Freq: Once | INTRAVENOUS | Status: AC
  Filled 2013-12-14: qty 100

## 2013-12-14 MED ORDER — LORAZEPAM 2 MG/ML IJ SOLN: 2.0000 mg | Freq: Once | INTRAMUSCULAR | Status: DC

## 2013-12-14 MED ORDER — SODIUM CHLORIDE 0.9 % IV SOLN
500.0000 mg | Freq: Two times a day (BID) | INTRAVENOUS | Status: DC
  Administered 2013-12-15 – 2013-12-16 (×3): 500 mg via INTRAVENOUS
  Filled 2013-12-14 (×4): qty 5

## 2013-12-14 MED ORDER — CHLORHEXIDINE GLUCONATE 0.12 % MT SOLN: 15.0000 mL | Freq: Two times a day (BID) | OROMUCOSAL | Status: DC

## 2013-12-14 MED ORDER — ONDANSETRON HCL 4 MG PO TABS: 4.0000 mg | ORAL_TABLET | Freq: Four times a day (QID) | ORAL | Status: DC | PRN

## 2013-12-14 MED ORDER — SODIUM CHLORIDE 0.9 % IV SOLN
20.0000 ug/h | INTRAVENOUS | Status: DC
  Filled 2013-12-14: qty 50

## 2013-12-14 MED ORDER — PANTOPRAZOLE SODIUM 40 MG PO TBEC
40.0000 mg | DELAYED_RELEASE_TABLET | Freq: Every day | ORAL | Status: DC
  Administered 2013-12-16 – 2013-12-17 (×2): 40 mg via ORAL
  Filled 2013-12-14 (×2): qty 1

## 2013-12-14 MED ORDER — SODIUM CHLORIDE 0.9 % IV SOLN: 500.0000 mg | Freq: Once | INTRAVENOUS | Status: DC
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
## 2013-12-14 NOTE — ED Notes (Signed)
Patient transported to CT 

## 2013-12-14 NOTE — Procedures (Signed)
Extubation Procedure Note  Patient Details:   Name: Keelin Sheridan DOB: 03/25/1951 MRN: [id number]   Airway Documentation:     Evaluation  O2 sats: stable throughout Complications: No apparent complications Patient did tolerate procedure well. Bilateral Breath Sounds: Clear Suctioning: Airway Yes  Extubated to 3L [place] per Dr. Hulen Skains with RN at the bedside.   Eddie North Lexie Koehl 12/14/2013, 7:55 PM

## 2013-12-14 NOTE — Progress Notes (Signed)
Pt transported from ED to CT and back on vent. No complications noted.

## 2013-12-14 NOTE — ED Notes (Signed)
Report called to Kimberly, RN.
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
## 2013-12-14 NOTE — ED Notes (Signed)
Lab test results given to Sabra Heck, MD

## 2013-12-14 NOTE — ED Notes (Signed)
Per Dr. Hulen Skains once sedation weaned and patient is able to follow commands patient is to be extubated.

## 2013-12-14 NOTE — Progress Notes (Signed)
EEG Completed; Results Pending  
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
## 2013-12-14 NOTE — ED Notes (Signed)
OR staff titrated Propofol gtt from 50-100 mcg/kg/min.

## 2013-12-15 ENCOUNTER — Inpatient Hospital Stay (HOSPITAL_COMMUNITY): Payer: BC Managed Care – PPO

## 2013-12-15 ENCOUNTER — Encounter (HOSPITAL_COMMUNITY): Payer: Self-pay | Admitting: *Deleted

## 2013-12-15 DIAGNOSIS — R569 Unspecified convulsions: Principal | ICD-10-CM

## 2013-12-15 LAB — CBC
HEMATOCRIT: 42.9 % (ref 39.0–52.0)
Hemoglobin: 14.1 g/dL (ref 13.0–17.0)
MCH: 31 pg (ref 26.0–34.0)
MCHC: 32.9 g/dL (ref 30.0–36.0)
MCV: 94.3 fL (ref 78.0–100.0)
Platelets: 202 10*3/uL (ref 150–400)
RBC: 4.55 MIL/uL (ref 4.22–5.81)
RDW: 13.1 % (ref 11.5–15.5)
WBC: 11.5 10*3/uL — ABNORMAL HIGH (ref 4.0–10.5)

## 2013-12-15 LAB — BASIC METABOLIC PANEL
BUN: 13 mg/dL (ref 6–23)
CHLORIDE: 106 meq/L (ref 96–112)
CO2: 24 mEq/L (ref 19–32)
CREATININE: 0.87 mg/dL (ref 0.50–1.35)
Calcium: 8.6 mg/dL (ref 8.4–10.5)
GFR calc Af Amer: >90 mL/min (ref >90)
GFR calc non Af Amer: >90 mL/min (ref >90)
Glucose, Bld: 83 mg/dL (ref 70–99)
Potassium: 3.7 mEq/L (ref 3.7–5.3)
Sodium: 142 mEq/L (ref 137–147)

## 2013-12-15 MED ORDER — ACETAMINOPHEN 325 MG PO TABS
650.0000 mg | ORAL_TABLET | ORAL | Status: DC | PRN
  Administered 2013-12-15 – 2013-12-17 (×7): 650 mg via ORAL
  Filled 2013-12-15 (×7): qty 2

## 2013-12-15 MED ORDER — MORPHINE SULFATE 2 MG/ML IJ SOLN: 2.0000 mg | INTRAMUSCULAR | Status: DC | PRN

## 2013-12-15 MED ORDER — KETOROLAC TROMETHAMINE 15 MG/ML IJ SOLN
15.0000 mg | Freq: Three times a day (TID) | INTRAMUSCULAR | Status: DC | PRN
  Administered 2013-12-15 – 2013-12-16 (×2): 15 mg via INTRAVENOUS
  Filled 2013-12-15 (×2): qty 1

## 2013-12-15 MED ORDER — GADOBENATE DIMEGLUMINE 529 MG/ML IV SOLN
20.0000 mL | Freq: Once | INTRAVENOUS | Status: AC | PRN
  Administered 2013-12-15: 20 mL via INTRAVENOUS

## 2013-12-15 MED ORDER — KETOROLAC TROMETHAMINE 30 MG/ML IJ SOLN
30.0000 mg | Freq: Once | INTRAMUSCULAR | Status: AC
  Administered 2013-12-15: 30 mg via INTRAVENOUS
  Filled 2013-12-15: qty 1

## 2013-12-15 NOTE — Progress Notes (Signed)
Made Dr. Doy Mince aware of patient being confused now. She will come to bedside to assess patient.

## 2013-12-15 NOTE — Consult Note (Signed)
Reason for Consult: Rule out closed head injury status post fall Referring Physician: Dr., trauma  Nicholas Perry is an 63 y.o. male.  HPI: Ms. 63 year old individual who sustained a fall from a ladder while using the electric tremor. He may have been electrocuted as the tremor cord was transected. Some seizure activity was noted at the scene and here in the emergency room where the patient was admitted. Had seen him yesterday after admission and a CT scan of the brain revealed no acute intracranial injury. He is seen now in followup as he still has a cervical collar on. MRI of the brain has been performed along with an MRI of the C-spine. The MRI of the brain is essentially normal MRI of the C-spine demonstrates that there is some cervical spondylosis at C5-C6.  History reviewed. No pertinent past medical history.  History reviewed. No pertinent past surgical history.  Family History  Problem Relation Age of Onset  . Hypertension Mother   . Hypertension Father     Social History:  reports that he has never smoked. He has never used smokeless tobacco. His alcohol and drug histories are not on file.  Allergies:  Allergies  Allergen Reactions  . Simvastatin     REACTION: weight loss, weakness    Medications: I have reviewed the patient's current medications.  Results for orders placed during the hospital encounter of 12/14/13 (from the past 48 hour(s))  PREPARE FRESH FROZEN PLASMA     Status: None   Collection Time    12/14/13  4:02 PM      Result Value Ref Range   Unit Number [id number]     Blood Component Type THAWED PLASMA     Unit division 00     Status of Unit REL FROM [facility]     Unit tag comment VERBAL ORDERS PER DR BEDNAR     Transfusion Status OK TO TRANSFUSE     Unit Number [id number]     Blood Component Type THAWED PLASMA     Unit division 00     Status of Unit REL FROM [facility]     Unit tag comment VERBAL ORDERS PER DR BEDNAR     Transfusion Status OK TO TRANSFUSE     TYPE AND SCREEN     Status: None   Collection Time    12/14/13  4:06 PM      Result Value Ref Range   ABO/RH(D) O POS     Antibody Screen NEG     Sample Expiration 12/17/2013     Unit Number [id number]     Blood Component Type RED CELLS,LR     Unit division 00     Status of Unit REL FROM [facility]     Unit tag comment VERBAL ORDERS PER DR BEDNAR     Transfusion Status OK TO TRANSFUSE     Crossmatch Result NOT NEEDED     Unit Number [id number]     Blood Component Type RED CELLS,LR     Unit division 00     Status of Unit REL FROM [facility]     Unit tag comment VERBAL ORDERS PER DR BEDNAR     Transfusion Status OK TO TRANSFUSE     Crossmatch Result NOT NEEDED    CK TOTAL AND CKMB     Status: Abnormal   Collection Time    12/14/13  4:06 PM      Result Value Ref Range   Total CK 144  7 - 232 U/L  CK, MB 4.0  0.3 - 4.0 ng/mL   Relative Index 2.8 (*) 0.0 - 2.5  CBC WITH DIFFERENTIAL     Status: None   Collection Time    12/14/13  4:06 PM      Result Value Ref Range   WBC 6.1  4.0 - 10.5 K/uL   RBC 4.39  4.22 - 5.81 MIL/uL   Hemoglobin 13.6  13.0 - 17.0 g/dL   HCT 40.8  39.0 - 52.0 %   MCV 92.9  78.0 - 100.0 fL   MCH 31.0  26.0 - 34.0 pg   MCHC 33.3  30.0 - 36.0 g/dL   RDW 12.9  11.5 - 15.5 %   Platelets 203  150 - 400 K/uL   Neutrophils Relative % 72  43 - 77 %   Neutro Abs 4.4  1.7 - 7.7 K/uL   Lymphocytes Relative 18  12 - 46 %   Lymphs Abs 1.1  0.7 - 4.0 K/uL   Monocytes Relative 9  3 - 12 %   Monocytes Absolute 0.5  0.1 - 1.0 K/uL   Eosinophils Relative 0  0 - 5 %   Eosinophils Absolute 0.0  0.0 - 0.7 K/uL   Basophils Relative 1  0 - 1 %   Basophils Absolute 0.0  0.0 - 0.1 K/uL  TROPONIN I     Status: None   Collection Time    12/14/13  4:06 PM      Result Value Ref Range   Troponin I <0.30  <0.30 ng/mL   Comment:            Due to the release kinetics of cTnI,     a negative result within the first hours     of the onset of symptoms does not rule out      myocardial infarction with certainty.     If myocardial infarction is still suspected,     repeat the test at appropriate intervals.  COMPREHENSIVE METABOLIC PANEL     Status: Abnormal   Collection Time    12/14/13  4:06 PM      Result Value Ref Range   Sodium 146  137 - 147 mEq/L   Potassium 4.5  3.7 - 5.3 mEq/L   Chloride 107  96 - 112 mEq/L   CO2 26  19 - 32 mEq/L   Glucose, Bld 84  70 - 99 mg/dL   BUN 20  6 - 23 mg/dL   Creatinine, Ser 1.20  0.50 - 1.35 mg/dL   Calcium 9.1  8.4 - 10.5 mg/dL   Total Protein 6.5  6.0 - 8.3 g/dL   Albumin 3.7  3.5 - 5.2 g/dL   AST 24  0 - 37 U/L   ALT 19  0 - 53 U/L   Alkaline Phosphatase 49  39 - 117 U/L   Total Bilirubin 0.4  0.3 - 1.2 mg/dL   GFR calc non Af Amer 64 (*) >90 mL/min   GFR calc Af Amer 74 (*) >90 mL/min   Comment: (NOTE)     The eGFR has been calculated using the CKD EPI equation.     This calculation has not been validated in all clinical situations.     eGFR's persistently <90 mL/min signify possible Chronic Kidney     Disease.  ABO/RH     Status: None   Collection Time    12/14/13  4:06 PM      Result Value Ref Range  ABO/RH(D) Jenetta Downer POS    Randolm Idol, ED     Status: None   Collection Time    12/14/13  4:14 PM      Result Value Ref Range   Troponin i, poc 0.00  0.00 - 0.08 ng/mL   Comment 3            Comment: Due to the release kinetics of cTnI,     a negative result within the first hours     of the onset of symptoms does not rule out     myocardial infarction with certainty.     If myocardial infarction is still suspected,     repeat the test at appropriate intervals.  I-STAT CHEM 8, ED     Status: Abnormal   Collection Time    12/14/13  4:15 PM      Result Value Ref Range   Sodium 143  137 - 147 mEq/L   Potassium 4.2  3.7 - 5.3 mEq/L   Chloride 105  96 - 112 mEq/L   BUN 20  6 - 23 mg/dL   Creatinine, Ser 1.40 (*) 0.50 - 1.35 mg/dL   Glucose, Bld 83  70 - 99 mg/dL   Calcium, Ion 1.17  1.13 - 1.30  mmol/L   TCO2 24  0 - 100 mmol/L   Hemoglobin 14.6  13.0 - 17.0 g/dL   HCT 43.0  39.0 - 52.0 %  I-STAT CG4 LACTIC ACID, ED     Status: None   Collection Time    12/14/13  4:16 PM      Result Value Ref Range   Lactic Acid, Venous 1.06  0.5 - 2.2 mmol/L  I-STAT ARTERIAL BLOOD GAS, ED     Status: Abnormal   Collection Time    12/14/13  5:12 PM      Result Value Ref Range   pH, Arterial 7.392  7.350 - 7.450   pCO2 arterial 41.9  35.0 - 45.0 mmHg   pO2, Arterial 246.0 (*) 80.0 - 100.0 mmHg   Bicarbonate 25.5 (*) 20.0 - 24.0 mEq/L   TCO2 27  0 - 100 mmol/L   O2 Saturation 100.0     Patient temperature 98.6 F     Collection site RADIAL, ALLEN'S TEST ACCEPTABLE     Drawn by RT     Sample type ARTERIAL    MRSA PCR SCREENING     Status: None   Collection Time    12/14/13  8:25 PM      Result Value Ref Range   MRSA by PCR NEGATIVE  NEGATIVE   Comment:            The GeneXpert MRSA Assay (FDA     approved for NASAL specimens     only), is one component of a     comprehensive MRSA colonization     surveillance program. It is not     intended to diagnose MRSA     infection nor to guide or     monitor treatment for     MRSA infections.  CBC     Status: Abnormal   Collection Time    12/15/13  3:20 AM      Result Value Ref Range   WBC 11.5 (*) 4.0 - 10.5 K/uL   RBC 4.55  4.22 - 5.81 MIL/uL   Hemoglobin 14.1  13.0 - 17.0 g/dL   HCT 42.9  39.0 - 52.0 %   MCV 94.3  78.0 - 100.0  fL   MCH 31.0  26.0 - 34.0 pg   MCHC 32.9  30.0 - 36.0 g/dL   RDW 13.1  11.5 - 15.5 %   Platelets 202  150 - 400 K/uL  BASIC METABOLIC PANEL     Status: None   Collection Time    12/15/13  3:20 AM      Result Value Ref Range   Sodium 142  137 - 147 mEq/L   Potassium 3.7  3.7 - 5.3 mEq/L   Chloride 106  96 - 112 mEq/L   CO2 24  19 - 32 mEq/L   Glucose, Bld 83  70 - 99 mg/dL   BUN 13  6 - 23 mg/dL   Creatinine, Ser 0.87  0.50 - 1.35 mg/dL   Comment: DELTA CHECK NOTED   Calcium 8.6  8.4 - 10.5 mg/dL    GFR calc non Af Amer >90  >90 mL/min   GFR calc Af Amer >90  >90 mL/min   Comment: (NOTE)     The eGFR has been calculated using the CKD EPI equation.     This calculation has not been validated in all clinical situations.     eGFR's persistently <90 mL/min signify possible Chronic Kidney     Disease.    Ct Head Wo Contrast  12/14/2013   CLINICAL DATA:  [place] off a ladder with frayed electrical wire found near the patient, possible electrical shock, having seizures, burn noted to top of head  EXAM: CT HEAD WITHOUT CONTRAST  CT CERVICAL SPINE WITHOUT CONTRAST  TECHNIQUE: Multidetector CT imaging of the head and cervical spine was performed following the standard protocol without intravenous contrast. Multiplanar CT image reconstructions of the cervical spine were also generated.  COMPARISON:  None.  FINDINGS: CT HEAD FINDINGS  Scattered ethmoid air cell opacification, with significant inflammatory change in both maxillary sinuses as well as the right frontal sinus. No skull fracture. No hemorrhage, infarct, or extra-axial fluid. No mass or hydrocephalus.  CT CERVICAL SPINE FINDINGS  Normal cervical spine anterior-posterior alignment. Endotracheal tube noted. No fracture or subluxation. Mild C4-5, C5-6 degenerative disc disease. Moderate C6-7 degenerative disc disease.  IMPRESSION: 1. No acute intracranial findings.  There is significant sinusitis. 2. No acute abnormality involving the cervical spine.   Electronically Signed   By: Skipper Cliche M.D.   On: 12/14/2013 17:31   Ct Chest W Contrast  12/14/2013   CLINICAL DATA:  [place] off ladder.  Possible electrocution.  Seizures.  EXAM: CT CHEST, ABDOMEN, AND PELVIS WITH CONTRAST  TECHNIQUE: Multidetector CT imaging of the chest, abdomen and pelvis was performed following the standard protocol during bolus administration of intravenous contrast.  CONTRAST:  180m OMNIPAQUE IOHEXOL 300 MG/ML  SOLN  COMPARISON:  None.  FINDINGS: CT CHEST FINDINGS  Soft Tissue /  Mediastinum: There is no axillary lymphadenopathy. Endotracheal tube is visualized in the trachea. No mediastinal or hilar lymphadenopathy. No evidence for mediastinal hematoma. No dissection flap or mural irregularity is visible within the thoracic aorta. Heart size is at upper normal. There is no pericardial effusion. No substantial pleural effusion. Tiny gas locules are seen in venous anatomy of the thoracic inlet bilaterally, likely secondary to IV access.  Lungs / Pleura: No evidence for pneumothorax. Dependent atelectasis is seen in the posterior lower lobes bilaterally with symmetric features.  Bones: Bone windows reveal no evidence for an acute fracture. Specifically, the sternum appears intact. No rib fracture is evident. No evidence for scapular fracture. No thoracic  spine fracture is evident although the patient did have a dedicated thoracic spine CT is part of this exam which will be reported separately.  CT ABDOMEN AND PELVIS FINDINGS  Liver: No focal abnormality to suggest laceration or contusion. No perihepatic fluid. No periportal edema.  Spleen: Normal.  No evidence for laceration or contusion.  Stomach: Nondistended but otherwise normal in appearance.  Pancreas: Normal.  Gallbladder/Biliary Tree: No evidence for gallstones. No intra or extrahepatic biliary duct dilatation.  Kidneys/Adrenals: No adrenal nodule or mass. Renal perfusion is symmetric. 3.5 cm well-defined water density lesion in the upper pole of the left kidney is compatible with a cyst. No focal mass lesion in the right kidney.  Bowel Loops: Prominent duodenum diverticulum noted. No small bowel dilatation. No evidence for small bowel wall thickening. Terminal ileum is unremarkable. The appendix is not visualized, but there is no edema or inflammation in the region of the cecum. No gross colonic abnormality.  Nodes: No gastrohepatic or hepatoduodenal ligament lymphadenopathy. No mesenteric lymphadenopathy. No evidence for pelvic  sidewall lymphadenopathy.  Vasculature: Atherosclerotic calcification is noted in the wall of the abdominal aorta without aneurysm.  Pelvic Genitourinary: The bladder is distended but otherwise has normal imaging features. The prostate gland appears mildly enlarged.  Bones/Musculoskeletal: No evidence for femoral neck fracture. No fracture identified within the bony pelvis. SI joints and symphysis pubis are normal. No evidence for lumbar spine fracture and patient also had lumbar spine CT performed at this visit which has been reported separately. There are scattered focal lucencies within the bony anatomy of the pelvis which have nonspecific imaging features by CT (See left iliac crest image 99, posterior left ilium 104, and right posterior ilium also on image 104.  Body Wall: No evidence for ventral abdominal hernia. No inguinal hernia.  Other: No intraperitoneal free fluid.  IMPRESSION: 1. No acute findings in the chest. There is an dependent atelectasis in both posterior lower lobes, but no evidence for pneumothorax, pleural effusion, or rib fracture. 2. No acute traumatic organ injury in the abdomen or pelvis. 3. No intraperitoneal free fluid. 4. Scattered lucencies within the bony anatomic pelvis are nonspecific. While likely benign, correlation for cancer history recommended. If there is clinical indication to further evaluate, bone scan may be helpful to exclude active bony turnover at these locations.   Electronically Signed   By: Misty Stanley M.D.   On: 12/14/2013 17:39   Ct Cervical Spine Wo Contrast  12/14/2013   CLINICAL DATA:  [place] off a ladder with frayed electrical wire found near the patient, possible electrical shock, having seizures, burn noted to top of head  EXAM: CT HEAD WITHOUT CONTRAST  CT CERVICAL SPINE WITHOUT CONTRAST  TECHNIQUE: Multidetector CT imaging of the head and cervical spine was performed following the standard protocol without intravenous contrast. Multiplanar CT image  reconstructions of the cervical spine were also generated.  COMPARISON:  None.  FINDINGS: CT HEAD FINDINGS  Scattered ethmoid air cell opacification, with significant inflammatory change in both maxillary sinuses as well as the right frontal sinus. No skull fracture. No hemorrhage, infarct, or extra-axial fluid. No mass or hydrocephalus.  CT CERVICAL SPINE FINDINGS  Normal cervical spine anterior-posterior alignment. Endotracheal tube noted. No fracture or subluxation. Mild C4-5, C5-6 degenerative disc disease. Moderate C6-7 degenerative disc disease.  IMPRESSION: 1. No acute intracranial findings.  There is significant sinusitis. 2. No acute abnormality involving the cervical spine.   Electronically Signed   By: Elodia Florence.D.  On: 12/14/2013 17:31   Ct Thoracic Spine Wo Contrast  12/14/2013   CLINICAL DATA:  [place] off ladder  EXAM: CT THORACIC SPINE WITHOUT CONTRAST  TECHNIQUE: Multidetector CT imaging of the thoracic spine was performed without intravenous contrast administration. Multiplanar CT image reconstructions were also generated.  COMPARISON:  None.  FINDINGS: Mild scoliosis. Normal anterior-posterior alignment. Mild multilevel degenerative disc disease in the central thoracic spine. No paraspinous hematoma. No thoracic spine fracture.  IMPRESSION: No acute findings   Electronically Signed   By: Skipper Cliche M.D.   On: 12/14/2013 17:36   Ct Lumbar Spine Wo Contrast  12/14/2013   CLINICAL DATA:  [place] off ladder  EXAM: CT LUMBAR SPINE WITHOUT CONTRAST  TECHNIQUE: Multidetector CT imaging of the lumbar spine was performed without intravenous contrast administration. Multiplanar CT image reconstructions were also generated.  COMPARISON:  None.  FINDINGS: Normal anterior-posterior alignment. No fracture. Mild L4-5 and L5-S1 degenerative disc disease. 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 No paraspinous hematoma. Superior portions of the sacrum are intact.  IMPRESSION: No acute abnormalities    Electronically Signed   By: Skipper Cliche M.D.   On: 12/14/2013 17:43   Mr Brain Wo Contrast  12/15/2013   CLINICAL DATA:  Altered mental status. Fall. Seizure activity. Possible electric shock.  EXAM: MRI HEAD WITHOUT CONTRAST  TECHNIQUE: Multiplanar, multiecho pulse sequences of the brain and surrounding structures were obtained without intravenous contrast.  COMPARISON:  CT head 12/14/2013.  FINDINGS: The diffusion-weighted images demonstrate no evidence for acute or subacute infarction. No hemorrhage or mass lesion is present. The ventricles are of normal size. There is no significant extra-axial fluid collection. Midline structures are within normal limits.  Flow is present in the major intracranial arteries. The globes and orbits are intact.  A fluid level is present in left maxillary sinus. There is diffuse mucosal thickening throughout the paranasal sinuses. A prominent polyp or mucous retention cyst is noted along the floor of the right maxillary sinus. Bilateral mastoid effusions are noted. This likely reflects the sequelae of recent intubation.  IMPRESSION: 1. Normal MRI appearance of the brain. 2. Diffuse sinus disease and bilateral mastoid effusions, likely related to recent intubation.   Electronically Signed   By: Lawrence Santiago M.D.   On: 12/15/2013 11:54   Mr Cervical Spine W Wo Contrast  12/15/2013   CLINICAL DATA:  Fall from ladder. Seizure activity. Probable electric shock.  EXAM: MRI CERVICAL SPINE WITHOUT AND WITH CONTRAST  TECHNIQUE: Multiplanar and multiecho pulse sequences of the cervical spine, to include the craniocervical junction and cervicothoracic junction, were obtained according to standard protocol without and with intravenous contrast.  CONTRAST:  93m MULTIHANCE GADOBENATE DIMEGLUMINE 529 MG/ML IV SOLN  COMPARISON:  Cervical spine CT 12/14/2013.  FINDINGS: Normal signal is present in the cervical and upper thoracic spinal cord to the lowest imaged level, T2-3. Focal endplate  marrow changes are evident at C5-6, left greater than right. Mild degenerative retrolisthesis is present. No associated soft tissue injury is evident. The prevertebral soft tissues are normal. Vertebral body heights, marrow signal, endplate marrow changes are noted on the left at C3-4. Marrow signal, vertebral body heights, and alignment are otherwise normal.  The craniocervical junction is within normal limits.  C2-3: Mild facet hypertrophy is present bilaterally without significant stenosis.  C3-4: Asymmetric left-sided uncovertebral spurring is present. There is mild left foraminal stenosis.  C4-5: A broad-based disc osteophyte complex is present. Asymmetric left-sided uncovertebral and facet hypertrophy result in mild to moderate  left foraminal stenosis.  C5-6: A broad-based disc osteophyte complex is present. Moderate uncovertebral spurring is evident bilaterally. Moderate foraminal stenosis is present bilaterally as well.  C6-7:  Negative.  C7-T1:  Negative.  IMPRESSION: 1. Multilevel spondylosis without evidence for acute traumatic injury. 2. Broad-based disc osteophyte complex and uncovertebral spurring at C5-6 results in mild central and moderate bilateral foraminal stenosis. 3. Mild to moderate left foraminal narrowing at C4-5. 4. Mild left foraminal stenosis at C3-4.   Electronically Signed   By: Lawrence Santiago M.D.   On: 12/15/2013 12:01   Ct Abdomen Pelvis W Contrast  12/14/2013   CLINICAL DATA:  [place] off ladder.  Possible electrocution.  Seizures.  EXAM: CT CHEST, ABDOMEN, AND PELVIS WITH CONTRAST  TECHNIQUE: Multidetector CT imaging of the chest, abdomen and pelvis was performed following the standard protocol during bolus administration of intravenous contrast.  CONTRAST:  192m OMNIPAQUE IOHEXOL 300 MG/ML  SOLN  COMPARISON:  None.  FINDINGS: CT CHEST FINDINGS  Soft Tissue / Mediastinum: There is no axillary lymphadenopathy. Endotracheal tube is visualized in the trachea. No mediastinal or hilar  lymphadenopathy. No evidence for mediastinal hematoma. No dissection flap or mural irregularity is visible within the thoracic aorta. Heart size is at upper normal. There is no pericardial effusion. No substantial pleural effusion. Tiny gas locules are seen in venous anatomy of the thoracic inlet bilaterally, likely secondary to IV access.  Lungs / Pleura: No evidence for pneumothorax. Dependent atelectasis is seen in the posterior lower lobes bilaterally with symmetric features.  Bones: Bone windows reveal no evidence for an acute fracture. Specifically, the sternum appears intact. No rib fracture is evident. No evidence for scapular fracture. No thoracic spine fracture is evident although the patient did have a dedicated thoracic spine CT is part of this exam which will be reported separately.  CT ABDOMEN AND PELVIS FINDINGS  Liver: No focal abnormality to suggest laceration or contusion. No perihepatic fluid. No periportal edema.  Spleen: Normal.  No evidence for laceration or contusion.  Stomach: Nondistended but otherwise normal in appearance.  Pancreas: Normal.  Gallbladder/Biliary Tree: No evidence for gallstones. No intra or extrahepatic biliary duct dilatation.  Kidneys/Adrenals: No adrenal nodule or mass. Renal perfusion is symmetric. 3.5 cm well-defined water density lesion in the upper pole of the left kidney is compatible with a cyst. No focal mass lesion in the right kidney.  Bowel Loops: Prominent duodenum diverticulum noted. No small bowel dilatation. No evidence for small bowel wall thickening. Terminal ileum is unremarkable. The appendix is not visualized, but there is no edema or inflammation in the region of the cecum. No gross colonic abnormality.  Nodes: No gastrohepatic or hepatoduodenal ligament lymphadenopathy. No mesenteric lymphadenopathy. No evidence for pelvic sidewall lymphadenopathy.  Vasculature: Atherosclerotic calcification is noted in the wall of the abdominal aorta without  aneurysm.  Pelvic Genitourinary: The bladder is distended but otherwise has normal imaging features. The prostate gland appears mildly enlarged.  Bones/Musculoskeletal: No evidence for femoral neck fracture. No fracture identified within the bony pelvis. SI joints and symphysis pubis are normal. No evidence for lumbar spine fracture and patient also had lumbar spine CT performed at this visit which has been reported separately. There are scattered focal lucencies within the bony anatomy of the pelvis which have nonspecific imaging features by CT (See left iliac crest image 99, posterior left ilium 104, and right posterior ilium also on image 104.  Body Wall: No evidence for ventral abdominal hernia. No inguinal hernia.  Other: No intraperitoneal free fluid.  IMPRESSION: 1. No acute findings in the chest. There is an dependent atelectasis in both posterior lower lobes, but no evidence for pneumothorax, pleural effusion, or rib fracture. 2. No acute traumatic organ injury in the abdomen or pelvis. 3. No intraperitoneal free fluid. 4. Scattered lucencies within the bony anatomic pelvis are nonspecific. While likely benign, correlation for cancer history recommended. If there is clinical indication to further evaluate, bone scan may be helpful to exclude active bony turnover at these locations.   Electronically Signed   By: Misty Stanley M.D.   On: 12/14/2013 17:39   Dg Pelvis Portable  12/14/2013   CLINICAL DATA:  Fall.  EXAM: PORTABLE PELVIS 1-2 VIEWS  COMPARISON:  None.  FINDINGS: No pelvic fracture or focal bone lesion.  IMPRESSION: Negative   Electronically Signed   By: Nelson Chimes M.D.   On: 12/14/2013 16:48   Dg Chest Portable 1 View  12/14/2013   CLINICAL DATA:  Trauma.  Intubated.  EXAM: PORTABLE CHEST - 1 VIEW  COMPARISON:  None.  FINDINGS: Endotracheal to has its tip 4 cm above the carina. Heart and mediastinal shadows are normal. Both lungs are clear. No pneumothorax or hemothorax. No acute bony  finding.  IMPRESSION: Endotracheal tube well positioned.  No abnormal chest finding.   Electronically Signed   By: Nelson Chimes M.D.   On: 12/14/2013 16:47    Review of Systems  Unable to perform ROS: acuity of condition   Blood pressure 116/60, pulse 70, temperature 98.6 F (37 C), temperature source Oral, resp. rate 13, height _0  (1.854 m), weight 78.2 kg (172 lb 6.4 oz), SpO2 96.00%. Physical Exam  HENT:  Head: Normocephalic.  Small left frontal abrasion  Eyes: Conjunctivae and EOM are normal. Pupils are equal, round, and reactive to light.  Neck: Normal range of motion. Neck supple.  Musculoskeletal:  Alert arouses to voice sore all over. Moves extremities well. Complains of severe headache when he is sat upright. Headache is followed by nausea and feeling of lightheadedness and

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
## 2013-12-15 NOTE — Progress Notes (Signed)
Subjective: Patient extubated.  Was doing well overnight.  Felt to be oriented.  About 630 this morning was noted to be confused.  Remains confused.  No changes noted on continuous monitoring.  Complains of numbness in his fingertips and a headache and blurry vision as well.    Objective: Current vital signs: BP 116/60  Pulse 70  Temp(Src) 99.2 F (37.3 C) (Oral)  Resp 13  Ht 6\' 1"  (1.854 m)  Wt 78.2 kg (172 lb 6.4 oz)  BMI 22.75 kg/m2  SpO2 96% Vital signs in last 24 hours: Temp:  [97 F (36.1 C)-99.2 F (37.3 C)] 99.2 F (37.3 C) (05/09 0429) Pulse Rate:  [50-72] 70 (05/09 0700) Resp:  [13-20] 13 (05/09 0700) BP: (109-146)/(50-104) 116/60 mmHg (05/09 0700) SpO2:  [96 %-100 %] 96 % (05/09 0700) FiO2 (%):  [50 %-100 %] 50 % (05/08 1716) Weight:  [78.2 kg (172 lb 6.4 oz)] 78.2 kg (172 lb 6.4 oz) (05/08 2034)  Intake/Output from previous day: 05/08 0701 - 05/09 0700 In: 3555 [I.V.:3450; IV Piggyback:105] Out: 2250 [Urine:2250] Intake/Output this shift:   Nutritional status: NPO  Neurologic Exam: Mental Status: Alert.  Follows commands.  Speech fluent without evidence of aphasia.  Knows where he is but reports that it is March 2013.  Is able to tell me some information about what he was doing prior to his fall.   Cranial Nerves: II: Discs flat bilaterally; Visual fields grossly normal, pupils equal, round, reactive to light and accommodation III,IV, VI: ptosis not present, extra-ocular motions intact bilaterally V,VII: smile symmetric, facial light touch sensation normal bilaterally VIII: hearing normal bilaterally IX,X: gag reflex present XI: bilateral shoulder shrug XII: midline tongue extension Motor: Able to lift and  Maintain all extremities antigravity Sensory: Pinprick and light touch intact throughout, bilaterally Deep Tendon Reflexes: 2+ and symmetric throughout   Lab Results: Basic Metabolic Panel:  Recent Labs Lab 12/14/13 1606 12/14/13 1615  12/15/13 0320  NA 146 143 142  K 4.5 4.2 3.7  CL 107 105 106  CO2 26  --  24  GLUCOSE 84 83 83  BUN 20 20 13   CREATININE 1.20 1.40* 0.87  CALCIUM 9.1  --  8.6    Liver Function Tests:  Recent Labs Lab 12/14/13 1606  AST 24  ALT 19  ALKPHOS 49  BILITOT 0.4  PROT 6.5  ALBUMIN 3.7   No results found for this basename: LIPASE, AMYLASE,  in the last 168 hours No results found for this basename: AMMONIA,  in the last 168 hours  CBC:  Recent Labs Lab 12/14/13 1606 12/14/13 1615 12/15/13 0320  WBC 6.1  --  11.5*  NEUTROABS 4.4  --   --   HGB 13.6 14.6 14.1  HCT 40.8 43.0 42.9  MCV 92.9  --  94.3  PLT 203  --  202    Cardiac Enzymes:  Recent Labs Lab 12/14/13 1606  CKTOTAL 144  CKMB 4.0  TROPONINI <0.30    Lipid Panel: No results found for this basename: CHOL, TRIG, HDL, CHOLHDL, VLDL, LDLCALC,  in the last 168 hours  CBG: No results found for this basename: GLUCAP,  in the last 168 hours  Microbiology: Results for orders placed during the hospital encounter of 12/14/13  MRSA PCR SCREENING     Status: None   Collection Time    12/14/13  8:25 PM      Result Value Ref Range Status   MRSA by PCR NEGATIVE  NEGATIVE Final  Comment:            The GeneXpert MRSA Assay (FDA     approved for NASAL specimens     only), is one component of a     comprehensive MRSA colonization     surveillance program. It is not     intended to diagnose MRSA     infection nor to guide or     monitor treatment for     MRSA infections.    Coagulation Studies: No results found for this basename: LABPROT, INR,  in the last 72 hours  Imaging: Ct Head Wo Contrast  12/14/2013   CLINICAL DATA:  [place] off a ladder with frayed electrical wire found near the patient, possible electrical shock, having seizures, burn noted to top of head  EXAM: CT HEAD WITHOUT CONTRAST  CT CERVICAL SPINE WITHOUT CONTRAST  TECHNIQUE: Multidetector CT imaging of the head and cervical spine was performed  following the standard protocol without intravenous contrast. Multiplanar CT image reconstructions of the cervical spine were also generated.  COMPARISON:  None.  FINDINGS: CT HEAD FINDINGS  Scattered ethmoid air cell opacification, with significant inflammatory change in both maxillary sinuses as well as the right frontal sinus. No skull fracture. No hemorrhage, infarct, or extra-axial fluid. No mass or hydrocephalus.  CT CERVICAL SPINE FINDINGS  Normal cervical spine anterior-posterior alignment. Endotracheal tube noted. No fracture or subluxation. Mild C4-5, C5-6 degenerative disc disease. Moderate C6-7 degenerative disc disease.  IMPRESSION: 1. No acute intracranial findings.  There is significant sinusitis. 2. No acute abnormality involving the cervical spine.   Electronically Signed   By: Skipper Cliche M.D.   On: 12/14/2013 17:31   Ct Chest W Contrast  12/14/2013   CLINICAL DATA:  [place] off ladder.  Possible electrocution.  Seizures.  EXAM: CT CHEST, ABDOMEN, AND PELVIS WITH CONTRAST  TECHNIQUE: Multidetector CT imaging of the chest, abdomen and pelvis was performed following the standard protocol during bolus administration of intravenous contrast.  CONTRAST:  172mL OMNIPAQUE IOHEXOL 300 MG/ML  SOLN  COMPARISON:  None.  FINDINGS: CT CHEST FINDINGS  Soft Tissue / Mediastinum: There is no axillary lymphadenopathy. Endotracheal tube is visualized in the trachea. No mediastinal or hilar lymphadenopathy. No evidence for mediastinal hematoma. No dissection flap or mural irregularity is visible within the thoracic aorta. Heart size is at upper normal. There is no pericardial effusion. No substantial pleural effusion. Tiny gas locules are seen in venous anatomy of the thoracic inlet bilaterally, likely secondary to IV access.  Lungs / Pleura: No evidence for pneumothorax. Dependent atelectasis is seen in the posterior lower lobes bilaterally with symmetric features.  Bones: Bone windows reveal no evidence for an  acute fracture. Specifically, the sternum appears intact. No rib fracture is evident. No evidence for scapular fracture. No thoracic spine fracture is evident although the patient did have a dedicated thoracic spine CT is part of this exam which will be reported separately.  CT ABDOMEN AND PELVIS FINDINGS  Liver: No focal abnormality to suggest laceration or contusion. No perihepatic fluid. No periportal edema.  Spleen: Normal.  No evidence for laceration or contusion.  Stomach: Nondistended but otherwise normal in appearance.  Pancreas: Normal.  Gallbladder/Biliary Tree: No evidence for gallstones. No intra or extrahepatic biliary duct dilatation.  Kidneys/Adrenals: No adrenal nodule or mass. Renal perfusion is symmetric. 3.5 cm well-defined water density lesion in the upper pole of the left kidney is compatible with a cyst. No focal mass lesion in the right  kidney.  Bowel Loops: Prominent duodenum diverticulum noted. No small bowel dilatation. No evidence for small bowel wall thickening. Terminal ileum is unremarkable. The appendix is not visualized, but there is no edema or inflammation in the region of the cecum. No gross colonic abnormality.  Nodes: No gastrohepatic or hepatoduodenal ligament lymphadenopathy. No mesenteric lymphadenopathy. No evidence for pelvic sidewall lymphadenopathy.  Vasculature: Atherosclerotic calcification is noted in the wall of the abdominal aorta without aneurysm.  Pelvic Genitourinary: The bladder is distended but otherwise has normal imaging features. The prostate gland appears mildly enlarged.  Bones/Musculoskeletal: No evidence for femoral neck fracture. No fracture identified within the bony pelvis. SI joints and symphysis pubis are normal. No evidence for lumbar spine fracture and patient also had lumbar spine CT performed at this visit which has been reported separately. There are scattered focal lucencies within the bony anatomy of the pelvis which have nonspecific imaging  features by CT (See left iliac crest image 99, posterior left ilium 104, and right posterior ilium also on image 104.  Body Wall: No evidence for ventral abdominal hernia. No inguinal hernia.  Other: No intraperitoneal free fluid.  IMPRESSION: 1. No acute findings in the chest. There is an dependent atelectasis in both posterior lower lobes, but no evidence for pneumothorax, pleural effusion, or rib fracture. 2. No acute traumatic organ injury in the abdomen or pelvis. 3. No intraperitoneal free fluid. 4. Scattered lucencies within the bony anatomic pelvis are nonspecific. While likely benign, correlation for cancer history recommended. If there is clinical indication to further evaluate, bone scan may be helpful to exclude active bony turnover at these locations.   Electronically Signed   By: Misty Stanley M.D.   On: 12/14/2013 17:39   Ct Cervical Spine Wo Contrast  12/14/2013   CLINICAL DATA:  [place] off a ladder with frayed electrical wire found near the patient, possible electrical shock, having seizures, burn noted to top of head  EXAM: CT HEAD WITHOUT CONTRAST  CT CERVICAL SPINE WITHOUT CONTRAST  TECHNIQUE: Multidetector CT imaging of the head and cervical spine was performed following the standard protocol without intravenous contrast. Multiplanar CT image reconstructions of the cervical spine were also generated.  COMPARISON:  None.  FINDINGS: CT HEAD FINDINGS  Scattered ethmoid air cell opacification, with significant inflammatory change in both maxillary sinuses as well as the right frontal sinus. No skull fracture. No hemorrhage, infarct, or extra-axial fluid. No mass or hydrocephalus.  CT CERVICAL SPINE FINDINGS  Normal cervical spine anterior-posterior alignment. Endotracheal tube noted. No fracture or subluxation. Mild C4-5, C5-6 degenerative disc disease. Moderate C6-7 degenerative disc disease.  IMPRESSION: 1. No acute intracranial findings.  There is significant sinusitis. 2. No acute abnormality  involving the cervical spine.   Electronically Signed   By: Skipper Cliche M.D.   On: 12/14/2013 17:31   Ct Thoracic Spine Wo Contrast  12/14/2013   CLINICAL DATA:  [place] off ladder  EXAM: CT THORACIC SPINE WITHOUT CONTRAST  TECHNIQUE: Multidetector CT imaging of the thoracic spine was performed without intravenous contrast administration. Multiplanar CT image reconstructions were also generated.  COMPARISON:  None.  FINDINGS: Mild scoliosis. Normal anterior-posterior alignment. Mild multilevel degenerative disc disease in the central thoracic spine. No paraspinous hematoma. No thoracic spine fracture.  IMPRESSION: No acute findings   Electronically Signed   By: Skipper Cliche M.D.   On: 12/14/2013 17:36   Ct Lumbar Spine Wo Contrast  12/14/2013   CLINICAL DATA:  [place] off ladder  EXAM: CT  LUMBAR SPINE WITHOUT CONTRAST  TECHNIQUE: Multidetector CT imaging of the lumbar spine was performed without intravenous contrast administration. Multiplanar CT image reconstructions were also generated.  COMPARISON:  None.  FINDINGS: Normal anterior-posterior alignment. No fracture. Mild L4-5 and L5-S1 degenerative disc disease. 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 No paraspinous hematoma. Superior portions of the sacrum are intact.  IMPRESSION: No acute abnormalities   Electronically Signed   By: Skipper Cliche M.D.   On: 12/14/2013 17:43   Ct Abdomen Pelvis W Contrast  12/14/2013   CLINICAL DATA:  [place] off ladder.  Possible electrocution.  Seizures.  EXAM: CT CHEST, ABDOMEN, AND PELVIS WITH CONTRAST  TECHNIQUE: Multidetector CT imaging of the chest, abdomen and pelvis was performed following the standard protocol during bolus administration of intravenous contrast.  CONTRAST:  19mL OMNIPAQUE IOHEXOL 300 MG/ML  SOLN  COMPARISON:  None.  FINDINGS: CT CHEST FINDINGS  Soft Tissue / Mediastinum: There is no axillary lymphadenopathy. Endotracheal tube is visualized in the trachea. No mediastinal or hilar lymphadenopathy. No  evidence for mediastinal hematoma. No dissection flap or mural irregularity is visible within the thoracic aorta. Heart size is at upper normal. There is no pericardial effusion. No substantial pleural effusion. Tiny gas locules are seen in venous anatomy of the thoracic inlet bilaterally, likely secondary to IV access.  Lungs / Pleura: No evidence for pneumothorax. Dependent atelectasis is seen in the posterior lower lobes bilaterally with symmetric features.  Bones: Bone windows reveal no evidence for an acute fracture. Specifically, the sternum appears intact. No rib fracture is evident. No evidence for scapular fracture. No thoracic spine fracture is evident although the patient did have a dedicated thoracic spine CT is part of this exam which will be reported separately.  CT ABDOMEN AND PELVIS FINDINGS  Liver: No focal abnormality to suggest laceration or contusion. No perihepatic fluid. No periportal edema.  Spleen: Normal.  No evidence for laceration or contusion.  Stomach: Nondistended but otherwise normal in appearance.  Pancreas: Normal.  Gallbladder/Biliary Tree: No evidence for gallstones. No intra or extrahepatic biliary duct dilatation.  Kidneys/Adrenals: No adrenal nodule or mass. Renal perfusion is symmetric. 3.5 cm well-defined water density lesion in the upper pole of the left kidney is compatible with a cyst. No focal mass lesion in the right kidney.  Bowel Loops: Prominent duodenum diverticulum noted. No small bowel dilatation. No evidence for small bowel wall thickening. Terminal ileum is unremarkable. The appendix is not visualized, but there is no edema or inflammation in the region of the cecum. No gross colonic abnormality.  Nodes: No gastrohepatic or hepatoduodenal ligament lymphadenopathy. No mesenteric lymphadenopathy. No evidence for pelvic sidewall lymphadenopathy.  Vasculature: Atherosclerotic calcification is noted in the wall of the abdominal aorta without aneurysm.  Pelvic  Genitourinary: The bladder is distended but otherwise has normal imaging features. The prostate gland appears mildly enlarged.  Bones/Musculoskeletal: No evidence for femoral neck fracture. No fracture identified within the bony pelvis. SI joints and symphysis pubis are normal. No evidence for lumbar spine fracture and patient also had lumbar spine CT performed at this visit which has been reported separately. There are scattered focal lucencies within the bony anatomy of the pelvis which have nonspecific imaging features by CT (See left iliac crest image 99, posterior left ilium 104, and right posterior ilium also on image 104.  Body Wall: No evidence for ventral abdominal hernia. No inguinal hernia.  Other: No intraperitoneal free fluid.  IMPRESSION: 1. No acute findings in the chest. There is  an dependent atelectasis in both posterior lower lobes, but no evidence for pneumothorax, pleural effusion, or rib fracture. 2. No acute traumatic organ injury in the abdomen or pelvis. 3. No intraperitoneal free fluid. 4. Scattered lucencies within the bony anatomic pelvis are nonspecific. While likely benign, correlation for cancer history recommended. If there is clinical indication to further evaluate, bone scan may be helpful to exclude active bony turnover at these locations.   Electronically Signed   By: Misty Stanley M.D.   On: 12/14/2013 17:39   Dg Pelvis Portable  12/14/2013   CLINICAL DATA:  Fall.  EXAM: PORTABLE PELVIS 1-2 VIEWS  COMPARISON:  None.  FINDINGS: No pelvic fracture or focal bone lesion.  IMPRESSION: Negative   Electronically Signed   By: Nelson Chimes M.D.   On: 12/14/2013 16:48   Dg Chest Portable 1 View  12/14/2013   CLINICAL DATA:  Trauma.  Intubated.  EXAM: PORTABLE CHEST - 1 VIEW  COMPARISON:  None.  FINDINGS: Endotracheal to has its tip 4 cm above the carina. Heart and mediastinal shadows are normal. Both lungs are clear. No pneumothorax or hemothorax. No acute bony finding.  IMPRESSION:  Endotracheal tube well positioned.  No abnormal chest finding.   Electronically Signed   By: Nelson Chimes M.D.   On: 12/14/2013 16:47    Medications:  I have reviewed the patient's current medications. Scheduled: . levETIRAcetam  500 mg Intravenous Q12H  . pantoprazole  40 mg Oral Daily   Or  . pantoprazole (PROTONIX) IV  40 mg Intravenous Daily    Assessment/Plan: Patient improved from initial presentation.  Based on patient's account and what was reportedly found at the scene it seems likely that the patient was electrocuted inadvertently when he cut a wire.  Preliminary review of recording shows no seizure activity overnight.  Patient remains on Keppra.    Recommendations: 1. D/C LTM 2. Repeat CT without contrast now with acute change in mental status.  Will change from portable and have patient taken down to radiology to improve quality of film. 3. MRI of the brain without contrast 4. Continue Keppra   LOS: 1 day   Alexis Goodell, MD [facility] [phone] 12/15/2013  7:57 AM

## 2013-12-16 ENCOUNTER — Encounter (HOSPITAL_COMMUNITY): Payer: Self-pay | Admitting: Surgery

## 2013-12-16 DIAGNOSIS — I498 Other specified cardiac arrhythmias: Secondary | ICD-10-CM

## 2013-12-16 DIAGNOSIS — S060X9A Concussion with loss of consciousness of unspecified duration, initial encounter: Secondary | ICD-10-CM

## 2013-12-16 DIAGNOSIS — S060XAA Concussion with loss of consciousness status unknown, initial encounter: Secondary | ICD-10-CM

## 2013-12-16 LAB — URINALYSIS, ROUTINE W REFLEX MICROSCOPIC
BILIRUBIN URINE: NEGATIVE
GLUCOSE, UA: NEGATIVE mg/dL
Hgb urine dipstick: NEGATIVE
KETONES UR: NEGATIVE mg/dL
Leukocytes, UA: NEGATIVE
NITRITE: NEGATIVE
PH: 5 (ref 5.0–8.0)
PROTEIN: NEGATIVE mg/dL
Specific Gravity, Urine: 1.011 (ref 1.005–1.030)
Urobilinogen, UA: 1 mg/dL (ref 0.0–1.0)

## 2013-12-16 MED ORDER — SODIUM CHLORIDE 0.9 % IJ SOLN
3.0000 mL | INTRAMUSCULAR | Status: DC | PRN
Start: 2013-12-16 — End: 2013-12-17

## 2013-12-16 MED ORDER — SODIUM CHLORIDE 0.9 % IJ SOLN
3.0000 mL | Freq: Two times a day (BID) | INTRAMUSCULAR | Status: DC
  Administered 2013-12-17: 3 mL via INTRAVENOUS

## 2013-12-16 MED ORDER — ENOXAPARIN SODIUM 40 MG/0.4ML ~~LOC~~ SOLN
40.0000 mg | SUBCUTANEOUS | Status: DC
  Administered 2013-12-16: 40 mg via SUBCUTANEOUS
  Filled 2013-12-16 (×3): qty 0.4

## 2013-12-16 MED ORDER — TRIAMCINOLONE ACETONIDE 55 MCG/ACT NA AERO
1.0000 | INHALATION_SPRAY | Freq: Every day | NASAL | Status: DC
  Administered 2013-12-17: 2 via NASAL
  Filled 2013-12-16: qty 10.8
  Filled 2013-12-16: qty 21.6

## 2013-12-16 MED ORDER — BACITRACIN-NEOMYCIN-POLYMYXIN 400-5-5000 EX OINT
TOPICAL_OINTMENT | Freq: Two times a day (BID) | CUTANEOUS | Status: DC
  Administered 2013-12-16 (×2): via TOPICAL
  Administered 2013-12-17: 1 via TOPICAL
  Filled 2013-12-16 (×5): qty 1

## 2013-12-16 MED ORDER — LACOSAMIDE 50 MG PO TABS
50.0000 mg | ORAL_TABLET | Freq: Two times a day (BID) | ORAL | Status: DC
  Administered 2013-12-16 – 2013-12-17 (×2): 50 mg via ORAL
  Filled 2013-12-16 (×2): qty 1

## 2013-12-16 MED ORDER — SODIUM CHLORIDE 0.9 % IV SOLN
200.0000 mg | Freq: Once | INTRAVENOUS | Status: AC
  Administered 2013-12-16: 200 mg via INTRAVENOUS
  Filled 2013-12-16: qty 20

## 2013-12-16 NOTE — Evaluation (Signed)
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 Seizure acitivty noted.  Clinical Impression  Patient demonstrates deficits in cognition and mobility as indicated below. Will benefit from continued skilled PT to address mobility deficits and balance. Will see as indicated and progress as tolerated. Anticipate patient will progress well with mobility and only need supervision upon discharge.     Follow Up Recommendations Supervision for mobility/OOB    Equipment Recommendations  Rolling walker with 5" wheels (may need if doesnt progress with balance prior to discharge)    Recommendations for Other Services       Precautions / Restrictions Precautions Precautions: Fall Restrictions Weight Bearing Restrictions: No      Mobility  Bed Mobility Overal bed mobility: Modified Independent                Transfers Overall transfer level: Needs assistance Equipment used: None Transfers: Sit to/from Stand Sit to Stand: Min guard         General transfer comment: Some noted instability upon standing  Ambulation/Gait Ambulation/Gait assistance: Min guard;Min assist Ambulation Distance (Feet): 150 Feet Assistive device: None Gait Pattern/deviations: Step-through pattern;Ataxic Gait velocity: decreased Gait velocity interpretation: Below normal speed for age/gender General Gait Details: Instability noted with ambulation, awkward gait fluctuations  Stairs            Wheelchair Mobility    Modified Rankin (Stroke Patients Only)       Balance Overall balance assessment: Needs assistance                           High level balance activites: Side stepping;Direction changes;Turns High Level Balance Comments: min assist to prevent LOB             Pertinent  Vitals/Pain 3/10 head, 4/10 back    Home Living Family/patient expects to be discharged to:: Private residence Living Arrangements: Spouse/significant other Available Help at Discharge: Family Type of Home: House Home Access: Stairs to enter Entrance Stairs-Rails: None Entrance Stairs-Number of Steps: 1 Home Layout: Two level;Able to live on main level with bedroom/bathroom Home Equipment: None Additional Comments: walk in shower with seat    Prior Function Level of Independence: Independent               Hand Dominance   Dominant Hand: Right    Extremity/Trunk Assessment   Upper Extremity Assessment:  (Bilateral finger sensory deficits and poor acuity)           Lower Extremity Assessment: Overall WFL for tasks assessed      Cervical / Trunk Assessment: Normal  Communication   Communication: No difficulties  Cognition Arousal/Alertness: Awake/alert Behavior During Therapy: Flat affect Overall Cognitive Status: Impaired/Different from baseline Area of Impairment: Memory;Problem solving     Memory: Decreased short-term memory       Problem Solving: Slow processing;Requires verbal cues General Comments: Patient stated multiple times that his pregnant daughter flew in from [place], also required increased time to recall birthdate, and had difficulty with word finding throughout session (could not say may so said it was the month 5    General Comments      Exercises        Assessment/Plan    PT Assessment Patient needs continued PT services  PT Diagnosis Abnormality of gait;Acute pain;Altered mental status  PT Problem List Decreased balance;Decreased mobility;Decreased cognition;Decreased coordination  PT Treatment Interventions DME instruction;Gait training;Stair training;Functional mobility training;Therapeutic activities;Therapeutic exercise;Balance training;Patient/family education   PT Goals (Current goals can be found in the Care Plan section)  Acute Rehab PT Goals Patient Stated Goal: to go home PT Goal Formulation: With patient/family Time For Goal Achievement: 12/23/13 Potential to Achieve Goals: Good    Frequency Min 4X/week   Barriers to discharge        Co-evaluation               End of Session Equipment Utilized During Treatment: Gait belt Activity Tolerance: Patient tolerated treatment well Patient left: Other (comment) (in w/c for transfer to new floor) Nurse Communication: Mobility status         Time: 7782-4235 PT Time Calculation (min): 26 min   Charges:   PT Evaluation $Initial PT Evaluation Tier I: 1 Procedure PT Treatments $Gait Training: 8-22 mins $Therapeutic Activity: 8-22 mins   PT G Codes:          Duncan Dull 12/16/2013, 11:07 AM Alben Deeds, PT DPT  [phone]

## 2013-12-16 NOTE — Progress Notes (Signed)
Subjective: Patient improved from yesterday but short term memory remains poor.  Headache improved and today patient able to tolerate HOB to 30 degrees.  Fingertips continue to be without feeling.    Objective: Current vital signs: BP 124/87  Pulse 66  Temp(Src) 98.5 F (36.9 C) (Oral)  Resp 20  Ht 6\' 1"  (1.854 m)  Wt 78.2 kg (172 lb 6.4 oz)  BMI 22.75 kg/m2  SpO2 97% Vital signs in last 24 hours: Temp:  [98.3 F (36.8 C)-98.5 F (36.9 C)] 98.5 F (36.9 C) (05/10 0257) Pulse Rate:  [46-66] 66 (05/10 0900) Resp:  [13-23] 20 (05/10 0900) BP: (99-137)/(57-87) 124/87 mmHg (05/10 0900) SpO2:  [95 %-100 %] 97 % (05/10 0900)  Intake/Output from previous day: 05/09 0701 - 05/10 0700 In: 1960 [I.V.:1750; IV Piggyback:210] Out: [id number] [Urine:1475] Intake/Output this shift:   Nutritional status: General  Neurologic Exam: Mental Status:  Alert. Follows commands. Speech fluent without evidence of aphasia. Knows where he is and is able to tell me the date but asks the same question multiple times and is unable to recall the specifics of our conversation even 1-2 minutes later. Cranial Nerves:  II: Discs flat bilaterally; Visual fields grossly normal, pupils equal, round, reactive to light and accommodation  III,IV, VI: ptosis not present, extra-ocular motions intact bilaterally  V,VII: smile symmetric, facial light touch sensation normal bilaterally  VIII: hearing normal bilaterally  IX,X: gag reflex present  XI: bilateral shoulder shrug  XII: midline tongue extension  Motor:  Able to lift and maintain all extremities antigravity  Sensory: Pinprick and light touch intact throughout, bilaterally  Deep Tendon Reflexes: 2+ and symmetric throughout    Lab Results: Basic Metabolic Panel:  Recent Labs Lab 12/14/13 1606 12/14/13 1615 12/15/13 0320  NA 146 143 142  K 4.5 4.2 3.7  CL 107 105 106  CO2 26  --  24  GLUCOSE 84 83 83  BUN 20 20 13   CREATININE 1.20 1.40* 0.87   CALCIUM 9.1  --  8.6    Liver Function Tests:  Recent Labs Lab 12/14/13 1606  AST 24  ALT 19  ALKPHOS 49  BILITOT 0.4  PROT 6.5  ALBUMIN 3.7   No results found for this basename: LIPASE, AMYLASE,  in the last 168 hours No results found for this basename: AMMONIA,  in the last 168 hours  CBC:  Recent Labs Lab 12/14/13 1606 12/14/13 1615 12/15/13 0320  WBC 6.1  --  11.5*  NEUTROABS 4.4  --   --   HGB 13.6 14.6 14.1  HCT 40.8 43.0 42.9  MCV 92.9  --  94.3  PLT 203  --  202    Cardiac Enzymes:  Recent Labs Lab 12/14/13 1606  CKTOTAL 144  CKMB 4.0  TROPONINI <0.30    Lipid Panel: No results found for this basename: CHOL, TRIG, HDL, CHOLHDL, VLDL, LDLCALC,  in the last 168 hours  CBG: No results found for this basename: GLUCAP,  in the last 168 hours  Microbiology: Results for orders placed during the hospital encounter of 12/14/13  MRSA PCR SCREENING     Status: None   Collection Time    12/14/13  8:25 PM      Result Value Ref Range Status   MRSA by PCR NEGATIVE  NEGATIVE Final   Comment:            The GeneXpert MRSA Assay (FDA     approved for NASAL specimens     only),  is one component of a     comprehensive MRSA colonization     surveillance program. It is not     intended to diagnose MRSA     infection nor to guide or     monitor treatment for     MRSA infections.    Coagulation Studies: No results found for this basename: LABPROT, INR,  in the last 72 hours  Imaging: Ct Head Wo Contrast  12/14/2013   CLINICAL DATA:  [place] off a ladder with frayed electrical wire found near the patient, possible electrical shock, having seizures, burn noted to top of head  EXAM: CT HEAD WITHOUT CONTRAST  CT CERVICAL SPINE WITHOUT CONTRAST  TECHNIQUE: Multidetector CT imaging of the head and cervical spine was performed following the standard protocol without intravenous contrast. Multiplanar CT image reconstructions of the cervical spine were also generated.   COMPARISON:  None.  FINDINGS: CT HEAD FINDINGS  Scattered ethmoid air cell opacification, with significant inflammatory change in both maxillary sinuses as well as the right frontal sinus. No skull fracture. No hemorrhage, infarct, or extra-axial fluid. No mass or hydrocephalus.  CT CERVICAL SPINE FINDINGS  Normal cervical spine anterior-posterior alignment. Endotracheal tube noted. No fracture or subluxation. Mild C4-5, C5-6 degenerative disc disease. Moderate C6-7 degenerative disc disease.  IMPRESSION: 1. No acute intracranial findings.  There is significant sinusitis. 2. No acute abnormality involving the cervical spine.   Electronically Signed   By: Skipper Cliche M.D.   On: 12/14/2013 17:31   Ct Chest W Contrast  12/14/2013   CLINICAL DATA:  [place] off ladder.  Possible electrocution.  Seizures.  EXAM: CT CHEST, ABDOMEN, AND PELVIS WITH CONTRAST  TECHNIQUE: Multidetector CT imaging of the chest, abdomen and pelvis was performed following the standard protocol during bolus administration of intravenous contrast.  CONTRAST:  181mL OMNIPAQUE IOHEXOL 300 MG/ML  SOLN  COMPARISON:  None.  FINDINGS: CT CHEST FINDINGS  Soft Tissue / Mediastinum: There is no axillary lymphadenopathy. Endotracheal tube is visualized in the trachea. No mediastinal or hilar lymphadenopathy. No evidence for mediastinal hematoma. No dissection flap or mural irregularity is visible within the thoracic aorta. Heart size is at upper normal. There is no pericardial effusion. No substantial pleural effusion. Tiny gas locules are seen in venous anatomy of the thoracic inlet bilaterally, likely secondary to IV access.  Lungs / Pleura: No evidence for pneumothorax. Dependent atelectasis is seen in the posterior lower lobes bilaterally with symmetric features.  Bones: Bone windows reveal no evidence for an acute fracture. Specifically, the sternum appears intact. No rib fracture is evident. No evidence for scapular fracture. No thoracic spine  fracture is evident although the patient did have a dedicated thoracic spine CT is part of this exam which will be reported separately.  CT ABDOMEN AND PELVIS FINDINGS  Liver: No focal abnormality to suggest laceration or contusion. No perihepatic fluid. No periportal edema.  Spleen: Normal.  No evidence for laceration or contusion.  Stomach: Nondistended but otherwise normal in appearance.  Pancreas: Normal.  Gallbladder/Biliary Tree: No evidence for gallstones. No intra or extrahepatic biliary duct dilatation.  Kidneys/Adrenals: No adrenal nodule or mass. Renal perfusion is symmetric. 3.5 cm well-defined water density lesion in the upper pole of the left kidney is compatible with a cyst. No focal mass lesion in the right kidney.  Bowel Loops: Prominent duodenum diverticulum noted. No small bowel dilatation. No evidence for small bowel wall thickening. Terminal ileum is unremarkable. The appendix is not visualized, but there  is no edema or inflammation in the region of the cecum. No gross colonic abnormality.  Nodes: No gastrohepatic or hepatoduodenal ligament lymphadenopathy. No mesenteric lymphadenopathy. No evidence for pelvic sidewall lymphadenopathy.  Vasculature: Atherosclerotic calcification is noted in the wall of the abdominal aorta without aneurysm.  Pelvic Genitourinary: The bladder is distended but otherwise has normal imaging features. The prostate gland appears mildly enlarged.  Bones/Musculoskeletal: No evidence for femoral neck fracture. No fracture identified within the bony pelvis. SI joints and symphysis pubis are normal. No evidence for lumbar spine fracture and patient also had lumbar spine CT performed at this visit which has been reported separately. There are scattered focal lucencies within the bony anatomy of the pelvis which have nonspecific imaging features by CT (See left iliac crest image 99, posterior left ilium 104, and right posterior ilium also on image 104.  Body Wall: No evidence  for ventral abdominal hernia. No inguinal hernia.  Other: No intraperitoneal free fluid.  IMPRESSION: 1. No acute findings in the chest. There is an dependent atelectasis in both posterior lower lobes, but no evidence for pneumothorax, pleural effusion, or rib fracture. 2. No acute traumatic organ injury in the abdomen or pelvis. 3. No intraperitoneal free fluid. 4. Scattered lucencies within the bony anatomic pelvis are nonspecific. While likely benign, correlation for cancer history recommended. If there is clinical indication to further evaluate, bone scan may be helpful to exclude active bony turnover at these locations.   Electronically Signed   By: Misty Stanley M.D.   On: 12/14/2013 17:39   Ct Cervical Spine Wo Contrast  12/14/2013   CLINICAL DATA:  [place] off a ladder with frayed electrical wire found near the patient, possible electrical shock, having seizures, burn noted to top of head  EXAM: CT HEAD WITHOUT CONTRAST  CT CERVICAL SPINE WITHOUT CONTRAST  TECHNIQUE: Multidetector CT imaging of the head and cervical spine was performed following the standard protocol without intravenous contrast. Multiplanar CT image reconstructions of the cervical spine were also generated.  COMPARISON:  None.  FINDINGS: CT HEAD FINDINGS  Scattered ethmoid air cell opacification, with significant inflammatory change in both maxillary sinuses as well as the right frontal sinus. No skull fracture. No hemorrhage, infarct, or extra-axial fluid. No mass or hydrocephalus.  CT CERVICAL SPINE FINDINGS  Normal cervical spine anterior-posterior alignment. Endotracheal tube noted. No fracture or subluxation. Mild C4-5, C5-6 degenerative disc disease. Moderate C6-7 degenerative disc disease.  IMPRESSION: 1. No acute intracranial findings.  There is significant sinusitis. 2. No acute abnormality involving the cervical spine.   Electronically Signed   By: Skipper Cliche M.D.   On: 12/14/2013 17:31   Ct Thoracic Spine Wo  Contrast  12/14/2013   CLINICAL DATA:  [place] off ladder  EXAM: CT THORACIC SPINE WITHOUT CONTRAST  TECHNIQUE: Multidetector CT imaging of the thoracic spine was performed without intravenous contrast administration. Multiplanar CT image reconstructions were also generated.  COMPARISON:  None.  FINDINGS: Mild scoliosis. Normal anterior-posterior alignment. Mild multilevel degenerative disc disease in the central thoracic spine. No paraspinous hematoma. No thoracic spine fracture.  IMPRESSION: No acute findings   Electronically Signed   By: Skipper Cliche M.D.   On: 12/14/2013 17:36   Ct Lumbar Spine Wo Contrast  12/14/2013   CLINICAL DATA:  [place] off ladder  EXAM: CT LUMBAR SPINE WITHOUT CONTRAST  TECHNIQUE: Multidetector CT imaging of the lumbar spine was performed without intravenous contrast administration. Multiplanar CT image reconstructions were also generated.  COMPARISON:  None.  FINDINGS: Normal anterior-posterior alignment. No fracture. Mild L4-5 and L5-S1 degenerative disc disease. 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 No paraspinous hematoma. Superior portions of the sacrum are intact.  IMPRESSION: No acute abnormalities   Electronically Signed   By: Skipper Cliche M.D.   On: 12/14/2013 17:43   Mr Brain Wo Contrast  12/15/2013   CLINICAL DATA:  Altered mental status. Fall. Seizure activity. Possible electric shock.  EXAM: MRI HEAD WITHOUT CONTRAST  TECHNIQUE: Multiplanar, multiecho pulse sequences of the brain and surrounding structures were obtained without intravenous contrast.  COMPARISON:  CT head 12/14/2013.  FINDINGS: The diffusion-weighted images demonstrate no evidence for acute or subacute infarction. No hemorrhage or mass lesion is present. The ventricles are of normal size. There is no significant extra-axial fluid collection. Midline structures are within normal limits.  Flow is present in the major intracranial arteries. The globes and orbits are intact.  A fluid level is present in left  maxillary sinus. There is diffuse mucosal thickening throughout the paranasal sinuses. A prominent polyp or mucous retention cyst is noted along the floor of the right maxillary sinus. Bilateral mastoid effusions are noted. This likely reflects the sequelae of recent intubation.  IMPRESSION: 1. Normal MRI appearance of the brain. 2. Diffuse sinus disease and bilateral mastoid effusions, likely related to recent intubation.   Electronically Signed   By: Lawrence Santiago M.D.   On: 12/15/2013 11:54   Mr Cervical Spine W Wo Contrast  12/15/2013   CLINICAL DATA:  Fall from ladder. Seizure activity. Probable electric shock.  EXAM: MRI CERVICAL SPINE WITHOUT AND WITH CONTRAST  TECHNIQUE: Multiplanar and multiecho pulse sequences of the cervical spine, to include the craniocervical junction and cervicothoracic junction, were obtained according to standard protocol without and with intravenous contrast.  CONTRAST:  35mL MULTIHANCE GADOBENATE DIMEGLUMINE 529 MG/ML IV SOLN  COMPARISON:  Cervical spine CT 12/14/2013.  FINDINGS: Normal signal is present in the cervical and upper thoracic spinal cord to the lowest imaged level, T2-3. Focal endplate marrow changes are evident at C5-6, left greater than right. Mild degenerative retrolisthesis is present. No associated soft tissue injury is evident. The prevertebral soft tissues are normal. Vertebral body heights, marrow signal, endplate marrow changes are noted on the left at C3-4. Marrow signal, vertebral body heights, and alignment are otherwise normal.  The craniocervical junction is within normal limits.  C2-3: Mild facet hypertrophy is present bilaterally without significant stenosis.  C3-4: Asymmetric left-sided uncovertebral spurring is present. There is mild left foraminal stenosis.  C4-5: A broad-based disc osteophyte complex is present. Asymmetric left-sided uncovertebral and facet hypertrophy result in mild to moderate left foraminal stenosis.  C5-6: A broad-based disc  osteophyte complex is present. Moderate uncovertebral spurring is evident bilaterally. Moderate foraminal stenosis is present bilaterally as well.  C6-7:  Negative.  C7-T1:  Negative.  IMPRESSION: 1. Multilevel spondylosis without evidence for acute traumatic injury. 2. Broad-based disc osteophyte complex and uncovertebral spurring at C5-6 results in mild central and moderate bilateral foraminal stenosis. 3. Mild to moderate left foraminal narrowing at C4-5. 4. Mild left foraminal stenosis at C3-4.   Electronically Signed   By: Lawrence Santiago M.D.   On: 12/15/2013 12:01   Ct Abdomen Pelvis W Contrast  12/14/2013   CLINICAL DATA:  [place] off ladder.  Possible electrocution.  Seizures.  EXAM: CT CHEST, ABDOMEN, AND PELVIS WITH CONTRAST  TECHNIQUE: Multidetector CT imaging of the chest, abdomen and pelvis was performed following the standard protocol during bolus administration of intravenous  contrast.  CONTRAST:  15mL OMNIPAQUE IOHEXOL 300 MG/ML  SOLN  COMPARISON:  None.  FINDINGS: CT CHEST FINDINGS  Soft Tissue / Mediastinum: There is no axillary lymphadenopathy. Endotracheal tube is visualized in the trachea. No mediastinal or hilar lymphadenopathy. No evidence for mediastinal hematoma. No dissection flap or mural irregularity is visible within the thoracic aorta. Heart size is at upper normal. There is no pericardial effusion. No substantial pleural effusion. Tiny gas locules are seen in venous anatomy of the thoracic inlet bilaterally, likely secondary to IV access.  Lungs / Pleura: No evidence for pneumothorax. Dependent atelectasis is seen in the posterior lower lobes bilaterally with symmetric features.  Bones: Bone windows reveal no evidence for an acute fracture. Specifically, the sternum appears intact. No rib fracture is evident. No evidence for scapular fracture. No thoracic spine fracture is evident although the patient did have a dedicated thoracic spine CT is part of this exam which will be reported  separately.  CT ABDOMEN AND PELVIS FINDINGS  Liver: No focal abnormality to suggest laceration or contusion. No perihepatic fluid. No periportal edema.  Spleen: Normal.  No evidence for laceration or contusion.  Stomach: Nondistended but otherwise normal in appearance.  Pancreas: Normal.  Gallbladder/Biliary Tree: No evidence for gallstones. No intra or extrahepatic biliary duct dilatation.  Kidneys/Adrenals: No adrenal nodule or mass. Renal perfusion is symmetric. 3.5 cm well-defined water density lesion in the upper pole of the left kidney is compatible with a cyst. No focal mass lesion in the right kidney.  Bowel Loops: Prominent duodenum diverticulum noted. No small bowel dilatation. No evidence for small bowel wall thickening. Terminal ileum is unremarkable. The appendix is not visualized, but there is no edema or inflammation in the region of the cecum. No gross colonic abnormality.  Nodes: No gastrohepatic or hepatoduodenal ligament lymphadenopathy. No mesenteric lymphadenopathy. No evidence for pelvic sidewall lymphadenopathy.  Vasculature: Atherosclerotic calcification is noted in the wall of the abdominal aorta without aneurysm.  Pelvic Genitourinary: The bladder is distended but otherwise has normal imaging features. The prostate gland appears mildly enlarged.  Bones/Musculoskeletal: No evidence for femoral neck fracture. No fracture identified within the bony pelvis. SI joints and symphysis pubis are normal. No evidence for lumbar spine fracture and patient also had lumbar spine CT performed at this visit which has been reported separately. There are scattered focal lucencies within the bony anatomy of the pelvis which have nonspecific imaging features by CT (See left iliac crest image 99, posterior left ilium 104, and right posterior ilium also on image 104.  Body Wall: No evidence for ventral abdominal hernia. No inguinal hernia.  Other: No intraperitoneal free fluid.  IMPRESSION: 1. No acute findings  in the chest. There is an dependent atelectasis in both posterior lower lobes, but no evidence for pneumothorax, pleural effusion, or rib fracture. 2. No acute traumatic organ injury in the abdomen or pelvis. 3. No intraperitoneal free fluid. 4. Scattered lucencies within the bony anatomic pelvis are nonspecific. While likely benign, correlation for cancer history recommended. If there is clinical indication to further evaluate, bone scan may be helpful to exclude active bony turnover at these locations.   Electronically Signed   By: Misty Stanley M.D.   On: 12/14/2013 17:39   Dg Pelvis Portable  12/14/2013   CLINICAL DATA:  Fall.  EXAM: PORTABLE PELVIS 1-2 VIEWS  COMPARISON:  None.  FINDINGS: No pelvic fracture or focal bone lesion.  IMPRESSION: Negative   Electronically Signed   By: Elta Guadeloupe  Shogry M.D.   On: 12/14/2013 16:48   Dg Chest Portable 1 View  12/14/2013   CLINICAL DATA:  Trauma.  Intubated.  EXAM: PORTABLE CHEST - 1 VIEW  COMPARISON:  None.  FINDINGS: Endotracheal to has its tip 4 cm above the carina. Heart and mediastinal shadows are normal. Both lungs are clear. No pneumothorax or hemothorax. No acute bony finding.  IMPRESSION: Endotracheal tube well positioned.  No abnormal chest finding.   Electronically Signed   By: Nelson Chimes M.D.   On: 12/14/2013 16:47    Medications:  I have reviewed the patient's current medications. Scheduled: . enoxaparin (LOVENOX) injection  40 mg Subcutaneous Q24H  . levETIRAcetam  500 mg Intravenous Q12H  . neomycin-bacitracin-polymyxin   Topical BID  . pantoprazole  40 mg Oral Daily   Or  . pantoprazole (PROTONIX) IV  40 mg Intravenous Daily  . sodium chloride  3 mL Intravenous Q12H    Assessment/Plan: Patient slowly improving.  MRI of the brain reviewed and shows no acute changes.  Patient reports not feeling well on the [place].  Has had no further seizures.    Recommendations: 1.  D/C Keppra 2.  Vimpat 200mg  IV with maintenance of 50mg  po BID 3.   Agree with therapy to attempt to get patient out of bed   LOS: 2 days   Alexis Goodell, MD [facility] [phone] 12/16/2013  10:16 AM

## 2013-12-16 NOTE — Progress Notes (Addendum)
Sitting up in chair - better - HA gone Recognizes me Nicholas Perry has all his teeth & no chips/lip injury No sore throat Daughters in room Still w numbness of tip of tongue & fingertips but mild.  Holds cup fine CN2-12 intact, hand grip 5/5.  Walking w walker in hallways Struggles to find words at times but o/w talking fine. Mild chest wall soreness Mild dysphagia better  -follow MS - improving -follow w change in sz med -no evid of decline encouraging sign -SLP eval for cognitive/TBI w/u for contusion -check orthostatics -reg diet

## 2013-12-16 NOTE — Progress Notes (Signed)
Patient ID: Nicholas Perry, male   DOB: [birth year], 63 y.o.   MRN: [id number]  LOS: 2 days   Subjective: Headaches are a bit better, able to sit up more than yesterday.  VSS.  Tolerating diet.   Objective: Vital signs in last 24 hours: Temp:  [98.3 F (36.8 C)-98.5 F (36.9 C)] 98.5 F (36.9 C) (05/10 0257) Pulse Rate:  [46-66] 52 (05/10 0600) Resp:  [13-21] 15 (05/10 0600) BP: (99-131)/(57-80) 124/80 mmHg (05/10 0600) SpO2:  [95 %-100 %] 100 % (05/10 0600)    Lab Results:  CBC  Recent Labs  12/14/13 1606 12/14/13 1615 12/15/13 0320  WBC 6.1  --  11.5*  HGB 13.6 14.6 14.1  HCT 40.8 43.0 42.9  PLT 203  --  202   BMET  Recent Labs  12/14/13 1606 12/14/13 1615 12/15/13 0320  NA 146 143 142  K 4.5 4.2 3.7  CL 107 105 106  CO2 26  --  24  GLUCOSE 84 83 83  BUN 20 20 13   CREATININE 1.20 1.40* 0.87  CALCIUM 9.1  --  8.6    Imaging: Ct Head Wo Contrast  12/14/2013   CLINICAL DATA:  [place] off a ladder with frayed electrical wire found near the patient, possible electrical shock, having seizures, burn noted to top of head  EXAM: CT HEAD WITHOUT CONTRAST  CT CERVICAL SPINE WITHOUT CONTRAST  TECHNIQUE: Multidetector CT imaging of the head and cervical spine was performed following the standard protocol without intravenous contrast. Multiplanar CT image reconstructions of the cervical spine were also generated.  COMPARISON:  None.  FINDINGS: CT HEAD FINDINGS  Scattered ethmoid air cell opacification, with significant inflammatory change in both maxillary sinuses as well as the right frontal sinus. No skull fracture. No hemorrhage, infarct, or extra-axial fluid. No mass or hydrocephalus.  CT CERVICAL SPINE FINDINGS  Normal cervical spine anterior-posterior alignment. Endotracheal tube noted. No fracture or subluxation. Mild C4-5, C5-6 degenerative disc disease. Moderate C6-7 degenerative disc disease.  IMPRESSION: 1. No acute intracranial findings.  There is significant sinusitis. 2. No  acute abnormality involving the cervical spine.   Electronically Signed   By: Skipper Cliche M.D.   On: 12/14/2013 17:31   Ct Chest W Contrast  12/14/2013   CLINICAL DATA:  [place] off ladder.  Possible electrocution.  Seizures.  EXAM: CT CHEST, ABDOMEN, AND PELVIS WITH CONTRAST  TECHNIQUE: Multidetector CT imaging of the chest, abdomen and pelvis was performed following the standard protocol during bolus administration of intravenous contrast.  CONTRAST:  165mL OMNIPAQUE IOHEXOL 300 MG/ML  SOLN  COMPARISON:  None.  FINDINGS: CT CHEST FINDINGS  Soft Tissue / Mediastinum: There is no axillary lymphadenopathy. Endotracheal tube is visualized in the trachea. No mediastinal or hilar lymphadenopathy. No evidence for mediastinal hematoma. No dissection flap or mural irregularity is visible within the thoracic aorta. Heart size is at upper normal. There is no pericardial effusion. No substantial pleural effusion. Tiny gas locules are seen in venous anatomy of the thoracic inlet bilaterally, likely secondary to IV access.  Lungs / Pleura: No evidence for pneumothorax. Dependent atelectasis is seen in the posterior lower lobes bilaterally with symmetric features.  Bones: Bone windows reveal no evidence for an acute fracture. Specifically, the sternum appears intact. No rib fracture is evident. No evidence for scapular fracture. No thoracic spine fracture is evident although the patient did have a dedicated thoracic spine CT is part of this exam which will be reported separately.  CT ABDOMEN AND  PELVIS FINDINGS  Liver: No focal abnormality to suggest laceration or contusion. No perihepatic fluid. No periportal edema.  Spleen: Normal.  No evidence for laceration or contusion.  Stomach: Nondistended but otherwise normal in appearance.  Pancreas: Normal.  Gallbladder/Biliary Tree: No evidence for gallstones. No intra or extrahepatic biliary duct dilatation.  Kidneys/Adrenals: No adrenal nodule or mass. Renal perfusion is  symmetric. 3.5 cm well-defined water density lesion in the upper pole of the left kidney is compatible with a cyst. No focal mass lesion in the right kidney.  Bowel Loops: Prominent duodenum diverticulum noted. No small bowel dilatation. No evidence for small bowel wall thickening. Terminal ileum is unremarkable. The appendix is not visualized, but there is no edema or inflammation in the region of the cecum. No gross colonic abnormality.  Nodes: No gastrohepatic or hepatoduodenal ligament lymphadenopathy. No mesenteric lymphadenopathy. No evidence for pelvic sidewall lymphadenopathy.  Vasculature: Atherosclerotic calcification is noted in the wall of the abdominal aorta without aneurysm.  Pelvic Genitourinary: The bladder is distended but otherwise has normal imaging features. The prostate gland appears mildly enlarged.  Bones/Musculoskeletal: No evidence for femoral neck fracture. No fracture identified within the bony pelvis. SI joints and symphysis pubis are normal. No evidence for lumbar spine fracture and patient also had lumbar spine CT performed at this visit which has been reported separately. There are scattered focal lucencies within the bony anatomy of the pelvis which have nonspecific imaging features by CT (See left iliac crest image 99, posterior left ilium 104, and right posterior ilium also on image 104.  Body Wall: No evidence for ventral abdominal hernia. No inguinal hernia.  Other: No intraperitoneal free fluid.  IMPRESSION: 1. No acute findings in the chest. There is an dependent atelectasis in both posterior lower lobes, but no evidence for pneumothorax, pleural effusion, or rib fracture. 2. No acute traumatic organ injury in the abdomen or pelvis. 3. No intraperitoneal free fluid. 4. Scattered lucencies within the bony anatomic pelvis are nonspecific. While likely benign, correlation for cancer history recommended. If there is clinical indication to further evaluate, bone scan may be helpful  to exclude active bony turnover at these locations.   Electronically Signed   By: Kennith Center M.D.   On: 12/14/2013 17:39   Ct Cervical Spine Wo Contrast  12/14/2013   CLINICAL DATA:  [place] off a ladder with frayed electrical wire found near the patient, possible electrical shock, having seizures, burn noted to top of head  EXAM: CT HEAD WITHOUT CONTRAST  CT CERVICAL SPINE WITHOUT CONTRAST  TECHNIQUE: Multidetector CT imaging of the head and cervical spine was performed following the standard protocol without intravenous contrast. Multiplanar CT image reconstructions of the cervical spine were also generated.  COMPARISON:  None.  FINDINGS: CT HEAD FINDINGS  Scattered ethmoid air cell opacification, with significant inflammatory change in both maxillary sinuses as well as the right frontal sinus. No skull fracture. No hemorrhage, infarct, or extra-axial fluid. No mass or hydrocephalus.  CT CERVICAL SPINE FINDINGS  Normal cervical spine anterior-posterior alignment. Endotracheal tube noted. No fracture or subluxation. Mild C4-5, C5-6 degenerative disc disease. Moderate C6-7 degenerative disc disease.  IMPRESSION: 1. No acute intracranial findings.  There is significant sinusitis. 2. No acute abnormality involving the cervical spine.   Electronically Signed   By: Esperanza Heir M.D.   On: 12/14/2013 17:31   Ct Thoracic Spine Wo Contrast  12/14/2013   CLINICAL DATA:  [place] Seat off ladder  EXAM: CT THORACIC SPINE WITHOUT CONTRAST  TECHNIQUE: Multidetector CT imaging of the thoracic spine was performed without intravenous contrast administration. Multiplanar CT image reconstructions were also generated.  COMPARISON:  None.  FINDINGS: Mild scoliosis. Normal anterior-posterior alignment. Mild multilevel degenerative disc disease in the central thoracic spine. No paraspinous hematoma. No thoracic spine fracture.  IMPRESSION: No acute findings   Electronically Signed   By: Skipper Cliche M.D.   On: 12/14/2013 17:36   Ct  Lumbar Spine Wo Contrast  12/14/2013   CLINICAL DATA:  [place] off ladder  EXAM: CT LUMBAR SPINE WITHOUT CONTRAST  TECHNIQUE: Multidetector CT imaging of the lumbar spine was performed without intravenous contrast administration. Multiplanar CT image reconstructions were also generated.  COMPARISON:  None.  FINDINGS: Normal anterior-posterior alignment. No fracture. Mild L4-5 and L5-S1 degenerative disc disease. 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 No paraspinous hematoma. Superior portions of the sacrum are intact.  IMPRESSION: No acute abnormalities   Electronically Signed   By: Skipper Cliche M.D.   On: 12/14/2013 17:43   Mr Brain Wo Contrast  12/15/2013   CLINICAL DATA:  Altered mental status. Fall. Seizure activity. Possible electric shock.  EXAM: MRI HEAD WITHOUT CONTRAST  TECHNIQUE: Multiplanar, multiecho pulse sequences of the brain and surrounding structures were obtained without intravenous contrast.  COMPARISON:  CT head 12/14/2013.  FINDINGS: The diffusion-weighted images demonstrate no evidence for acute or subacute infarction. No hemorrhage or mass lesion is present. The ventricles are of normal size. There is no significant extra-axial fluid collection. Midline structures are within normal limits.  Flow is present in the major intracranial arteries. The globes and orbits are intact.  A fluid level is present in left maxillary sinus. There is diffuse mucosal thickening throughout the paranasal sinuses. A prominent polyp or mucous retention cyst is noted along the floor of the right maxillary sinus. Bilateral mastoid effusions are noted. This likely reflects the sequelae of recent intubation.  IMPRESSION: 1. Normal MRI appearance of the brain. 2. Diffuse sinus disease and bilateral mastoid effusions, likely related to recent intubation.   Electronically Signed   By: Lawrence Santiago M.D.   On: 12/15/2013 11:54   Mr Cervical Spine W Wo Contrast  12/15/2013   CLINICAL DATA:  Fall from ladder. Seizure  activity. Probable electric shock.  EXAM: MRI CERVICAL SPINE WITHOUT AND WITH CONTRAST  TECHNIQUE: Multiplanar and multiecho pulse sequences of the cervical spine, to include the craniocervical junction and cervicothoracic junction, were obtained according to standard protocol without and with intravenous contrast.  CONTRAST:  81mL MULTIHANCE GADOBENATE DIMEGLUMINE 529 MG/ML IV SOLN  COMPARISON:  Cervical spine CT 12/14/2013.  FINDINGS: Normal signal is present in the cervical and upper thoracic spinal cord to the lowest imaged level, T2-3. Focal endplate marrow changes are evident at C5-6, left greater than right. Mild degenerative retrolisthesis is present. No associated soft tissue injury is evident. The prevertebral soft tissues are normal. Vertebral body heights, marrow signal, endplate marrow changes are noted on the left at C3-4. Marrow signal, vertebral body heights, and alignment are otherwise normal.  The craniocervical junction is within normal limits.  C2-3: Mild facet hypertrophy is present bilaterally without significant stenosis.  C3-4: Asymmetric left-sided uncovertebral spurring is present. There is mild left foraminal stenosis.  C4-5: A broad-based disc osteophyte complex is present. Asymmetric left-sided uncovertebral and facet hypertrophy result in mild to moderate left foraminal stenosis.  C5-6: A broad-based disc osteophyte complex is present. Moderate uncovertebral spurring is evident bilaterally. Moderate foraminal stenosis is present bilaterally as well.  C6-7:  Negative.  C7-T1:  Negative.  IMPRESSION: 1. Multilevel spondylosis without evidence for acute traumatic injury. 2. Broad-based disc osteophyte complex and uncovertebral spurring at C5-6 results in mild central and moderate bilateral foraminal stenosis. 3. Mild to moderate left foraminal narrowing at C4-5. 4. Mild left foraminal stenosis at C3-4.   Electronically Signed   By: Lawrence Santiago M.D.   On: 12/15/2013 12:01   Ct Abdomen  Pelvis W Contrast  12/14/2013   CLINICAL DATA:  [place] off ladder.  Possible electrocution.  Seizures.  EXAM: CT CHEST, ABDOMEN, AND PELVIS WITH CONTRAST  TECHNIQUE: Multidetector CT imaging of the chest, abdomen and pelvis was performed following the standard protocol during bolus administration of intravenous contrast.  CONTRAST:  131mL OMNIPAQUE IOHEXOL 300 MG/ML  SOLN  COMPARISON:  None.  FINDINGS: CT CHEST FINDINGS  Soft Tissue / Mediastinum: There is no axillary lymphadenopathy. Endotracheal tube is visualized in the trachea. No mediastinal or hilar lymphadenopathy. No evidence for mediastinal hematoma. No dissection flap or mural irregularity is visible within the thoracic aorta. Heart size is at upper normal. There is no pericardial effusion. No substantial pleural effusion. Tiny gas locules are seen in venous anatomy of the thoracic inlet bilaterally, likely secondary to IV access.  Lungs / Pleura: No evidence for pneumothorax. Dependent atelectasis is seen in the posterior lower lobes bilaterally with symmetric features.  Bones: Bone windows reveal no evidence for an acute fracture. Specifically, the sternum appears intact. No rib fracture is evident. No evidence for scapular fracture. No thoracic spine fracture is evident although the patient did have a dedicated thoracic spine CT is part of this exam which will be reported separately.  CT ABDOMEN AND PELVIS FINDINGS  Liver: No focal abnormality to suggest laceration or contusion. No perihepatic fluid. No periportal edema.  Spleen: Normal.  No evidence for laceration or contusion.  Stomach: Nondistended but otherwise normal in appearance.  Pancreas: Normal.  Gallbladder/Biliary Tree: No evidence for gallstones. No intra or extrahepatic biliary duct dilatation.  Kidneys/Adrenals: No adrenal nodule or mass. Renal perfusion is symmetric. 3.5 cm well-defined water density lesion in the upper pole of the left kidney is compatible with a cyst. No focal mass  lesion in the right kidney.  Bowel Loops: Prominent duodenum diverticulum noted. No small bowel dilatation. No evidence for small bowel wall thickening. Terminal ileum is unremarkable. The appendix is not visualized, but there is no edema or inflammation in the region of the cecum. No gross colonic abnormality.  Nodes: No gastrohepatic or hepatoduodenal ligament lymphadenopathy. No mesenteric lymphadenopathy. No evidence for pelvic sidewall lymphadenopathy.  Vasculature: Atherosclerotic calcification is noted in the wall of the abdominal aorta without aneurysm.  Pelvic Genitourinary: The bladder is distended but otherwise has normal imaging features. The prostate gland appears mildly enlarged.  Bones/Musculoskeletal: No evidence for femoral neck fracture. No fracture identified within the bony pelvis. SI joints and symphysis pubis are normal. No evidence for lumbar spine fracture and patient also had lumbar spine CT performed at this visit which has been reported separately. There are scattered focal lucencies within the bony anatomy of the pelvis which have nonspecific imaging features by CT (See left iliac crest image 99, posterior left ilium 104, and right posterior ilium also on image 104.  Body Wall: No evidence for ventral abdominal hernia. No inguinal hernia.  Other: No intraperitoneal free fluid.  IMPRESSION: 1. No acute findings in the chest. There is an dependent atelectasis in both posterior lower lobes, but no evidence for pneumothorax,  pleural effusion, or rib fracture. 2. No acute traumatic organ injury in the abdomen or pelvis. 3. No intraperitoneal free fluid. 4. Scattered lucencies within the bony anatomic pelvis are nonspecific. While likely benign, correlation for cancer history recommended. If there is clinical indication to further evaluate, bone scan may be helpful to exclude active bony turnover at these locations.   Electronically Signed   By: Misty Stanley M.D.   On: 12/14/2013 17:39   Dg  Pelvis Portable  12/14/2013   CLINICAL DATA:  Fall.  EXAM: PORTABLE PELVIS 1-2 VIEWS  COMPARISON:  None.  FINDINGS: No pelvic fracture or focal bone lesion.  IMPRESSION: Negative   Electronically Signed   By: Nelson Chimes M.D.   On: 12/14/2013 16:48   Dg Chest Portable 1 View  12/14/2013   CLINICAL DATA:  Trauma.  Intubated.  EXAM: PORTABLE CHEST - 1 VIEW  COMPARISON:  None.  FINDINGS: Endotracheal to has its tip 4 cm above the carina. Heart and mediastinal shadows are normal. Both lungs are clear. No pneumothorax or hemothorax. No acute bony finding.  IMPRESSION: Endotracheal tube well positioned.  No abnormal chest finding.   Electronically Signed   By: Nelson Chimes M.D.   On: 12/14/2013 16:47     PE: General appearance: alert, cooperative and no distress Head: Normocephalic, without obvious abnormality, atraumatic Eyes: conjunctivae/corneas clear. PERRL, EOM's intact. Fundi benign. Resp: clear to auscultation bilaterally Cardio: regular rate and rhythm, S1, S2 normal, no murmur, click, rub or gallop GI: soft, non-tender; bowel sounds normal; no masses,  no organomegaly Extremities: extremities normal, atraumatic, no cyanosis or edema    Patient Active Problem List   Diagnosis Date Noted  . Seizure 12/14/2013  . Closed head injury 12/14/2013  . Seizure after head injury 12/14/2013  . ATRIAL FIBRILLATION 09/22/2009  . HYPERLIPIDEMIA 01/02/2008  . HYPERTENSION 01/02/2008   Assessment/Plan: Fall from ladder Lezlie Octave per neurology Concussion-slow mentation although improved.  Add TBI therapies -MRI of head and neck 5/9 without acute abnormalities, neck cleared  -transfer to floor -SLIV -pain control, toradol, check BMET in AM Bradycardia-EKG without any changes Electrical burn-minor, superficial, add bacitracin  VTE - SCD's, add lovenox  FEN - tolerating diet  Dispo -- transfer to floor    Erby Pian, ANP-BC Pager: [phone] General Trauma [place] Pager: [phone]    12/16/2013 8:39 AM

## 2013-12-16 NOTE — Progress Notes (Signed)
PT Cancellation Note  Patient Details Name: Nicholas Perry MRN: [id number] DOB: 01-Jan-1951   Cancelled Treatment:    Reason Eval/Treat Not Completed: Patient not medically ready (active bed rest orders still indicated)   Duncan Dull 12/16/2013, 9:07 AM Alben Deeds, PT DPT  [phone]

## 2013-12-16 NOTE — Progress Notes (Signed)
Patient ID: Nicholas Perry, male   DOB: [birth year], 63 y.o.   MRN: [id number] Alert and oriented. Expose of headache with sitting seems to be subsiding considerably. Patient has been at bed rest and would suggest ambulation and mobilization today. Physical therapy to be involved.

## 2013-12-16 NOTE — Procedures (Signed)
ELECTROENCEPHALOGRAM REPORT   Patient: Nicholas Perry       Room #: 8T15 EEG No. ID: [id number] Age: 63 y.o.        Sex: male Referring Physician: Zubelevitskiy Report Date:  12/15/2013        Interpreting Physician: Alexis Goodell  History: [name] is an 63 y.o. male s/p fall off ladder with seizure activity on presentation  Medications:  Scheduled: . enoxaparin (LOVENOX) injection  40 mg Subcutaneous Q24H  . lacosamide  50 mg Oral BID  . neomycin-bacitracin-polymyxin   Topical BID  . pantoprazole  40 mg Oral Daily   Or  . pantoprazole (PROTONIX) IV  40 mg Intravenous Daily  . sodium chloride  3 mL Intravenous Q12H    Conditions of Recording: This is a 16 channel EEG carried out with concomitant video from 12/14/2013 at 2123 to 12/15/2013 at 0823. It was performed during the states of wakefulness and sleep.   Description:  The waking background activity consists of a low voltage, symmetrical, fairly well organized, 11 Hz alpha activity, seen from the parieto-occipital and posterior temporal regions. Low voltage fast activity, poorly organized, is seen anteriorly and is at times superimposed on more posterior regions. A mixture of theta and alpha rhythms are seen from the central and temporal regions.  The patient drowses with slowing to irregular, low voltage theta and beta activity.  The patient goes in to a light sleep with symmetrical sleep spindles, vertex central sharp transients and irregular slow activity.  There were no push button events during the recording.  Hyperventilation and intermittent photic stimulation were not performed.   IMPRESSION: This is a normal awake and asleep 11 hour video EEG monitoring. There were no push button events recorded. No epileptiform activity was noted.    Alexis Goodell, MD [facility] [phone] 12/15/2013, 11:51 AM

## 2013-12-17 DIAGNOSIS — W19XXXA Unspecified fall, initial encounter: Secondary | ICD-10-CM | POA: Diagnosis present

## 2013-12-17 DIAGNOSIS — T754XXA Electrocution, initial encounter: Secondary | ICD-10-CM | POA: Diagnosis present

## 2013-12-17 LAB — BASIC METABOLIC PANEL
BUN: 11 mg/dL (ref 6–23)
CALCIUM: 9.1 mg/dL (ref 8.4–10.5)
CHLORIDE: 107 meq/L (ref 96–112)
CO2: 25 meq/L (ref 19–32)
Creatinine, Ser: 1.04 mg/dL (ref 0.50–1.35)
GFR calc Af Amer: 88 mL/min — ABNORMAL LOW (ref >90)
GFR calc non Af Amer: 76 mL/min — ABNORMAL LOW (ref >90)
GLUCOSE: 94 mg/dL (ref 70–99)
Potassium: 4.3 mEq/L (ref 3.7–5.3)
Sodium: 144 mEq/L (ref 137–147)

## 2013-12-17 MED ORDER — LACOSAMIDE 50 MG PO TABS: 50.0000 mg | ORAL_TABLET | Freq: Two times a day (BID) | ORAL | Status: DC

## 2013-12-17 NOTE — ED Provider Notes (Signed)
I saw and evaluated the patient, reviewed the resident's note and I agree with the findings and plan.  Please see my separate note regarding my evaluation of the patient.     Johnna Acosta, MD 12/17/13 1034

## 2013-12-17 NOTE — Progress Notes (Signed)
UR completed.  Callan Yontz, RN BSN [facility] CCM Trauma/Neuro ICU Case Manager [phone]  

## 2013-12-17 NOTE — Progress Notes (Signed)
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 Seizure acitivty noted.    PT Comments    Pt continues to be slow of processing and affect still flat.  Mobility has improved.  Pt still sore, but can quickly limber up and handle balance challenge without any overt deviation.  Follow Up Recommendations  Supervision for mobility/OOB     Equipment Recommendations  None recommended by PT    Recommendations for Other Services       Precautions / Restrictions Precautions Precautions: Fall Restrictions Weight Bearing Restrictions: No    Mobility  Bed Mobility Overal bed mobility: Modified Independent                Transfers Overall transfer level: Needs assistance Equipment used: None Transfers: Sit to/from Stand Sit to Stand: Supervision         General transfer comment: mild instability on initial standing, but not present after warm up  Ambulation/Gait Ambulation/Gait assistance: Supervision Ambulation Distance (Feet): 1000 Feet Assistive device: None Gait Pattern/deviations: Step-through pattern;Wide base of support Gait velocity: as he progressed through session able to increase speeds to age appropriate levels   General Gait Details: initial unsteadiness giving was to steady gait and ability as session progressed to handle higher level balance challenge.   Stairs Stairs: Yes Stairs assistance: Supervision Stair Management: One rail Right;Alternating pattern;No rails;Forwards Number of Stairs: 15 General stair comments: Initially used rail, then tried without rail.  Both steady and fluid  Wheelchair Mobility    Modified Rankin (Stroke Patients Only)       Balance Overall balance assessment: Needs assistance Sitting-balance support: Feet supported;No upper extremity  supported Sitting balance-Leahy Scale: Good (to normal)     Standing balance support: No upper extremity supported Standing balance-Leahy Scale: Good               High level balance activites: Backward walking;Direction changes;Sudden stops;Turns;Head turns;Other (comment) (stairs, stepping over tiles ~18 inches) High Level Balance Comments: After initial mild unsteadiness, pt managed higher level balance  challenges without LOB or deviation    Cognition Arousal/Alertness: Awake/alert Behavior During Therapy: Flat affect Overall Cognitive Status: Impaired/Different from baseline Area of Impairment: Memory;Problem solving     Memory: Decreased short-term memory       Problem Solving: Slow processing;Requires verbal cues General Comments: Still having trouble transitioning through conversation.    Exercises      General Comments        Pertinent Vitals/Pain     Home Living                      Prior Function            PT Goals (current goals can now be found in the care plan section) Acute Rehab PT Goals Patient Stated Goal: to go home PT Goal Formulation: With patient/family Time For Goal Achievement: 12/23/13 Potential to Achieve Goals: Good Progress towards PT goals: Progressing toward goals    Frequency       PT Plan Current plan remains appropriate    Co-evaluation             End of Session   Activity Tolerance: Patient tolerated treatment well Patient left: with family/visitor present;Other (comment) (standing at EOB)     Time: 1022-1050 PT Time Calculation (min): 28 min  Charges:  $Gait Training: 23-37 mins                    G Codes:      Tessie Fass Uma Jerde 12/17/2013, 11:08 AM
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
## 2013-12-17 NOTE — Discharge Instructions (Signed)
No driving or returning to work until cleared by neurology.  Wash wounds daily in shower with soap and water. Do not soak. Apply antibiotic ointment (e.g. Neosporin) twice daily and as needed to keep moist. Cover with dry dressing if desired.

## 2013-12-17 NOTE — Progress Notes (Signed)
Subjective: Patient reports Alert oriented still with headache. Headache does not appear to be positional is more constant also notes some visual blurring am not clearly double vision and also has some hyperacusis. No stage III sensation in fingertips making it difficult to grip things assuredly. Gait has been intact.  Objective: Vital signs in last 24 hours: Temp:  [97.5 F (36.4 C)-98.7 F (37.1 C)] 97.5 F (36.4 C) (05/11 0636) Pulse Rate:  [51-65] 51 (05/11 0636) Resp:  [14-20] 20 (05/11 0636) BP: (107-139)/(63-91) 139/88 mmHg (05/11 0636) SpO2:  [94 %-100 %] 94 % (05/11 0636)  Intake/Output from previous day: 05/10 0701 - 05/11 0700 In: 225 [I.V.:225] Out: 300 [Urine:300] Intake/Output this shift:    No formal physical exam performed today other than conversation  Lab Results:  Recent Labs  12/14/13 1606 12/14/13 1615 12/15/13 0320  WBC 6.1  --  11.5*  HGB 13.6 14.6 14.1  HCT 40.8 43.0 42.9  PLT 203  --  202   BMET  Recent Labs  12/15/13 0320 12/17/13 0731  NA 142 144  K 3.7 4.3  CL 106 107  CO2 24 25  GLUCOSE 83 94  BUN 13 11  CREATININE 0.87 1.04  CALCIUM 8.6 9.1    Studies/Results: No results found.  Assessment/Plan: Or logically appears to be improving though he is aware of some somatosensory deficits. This should likely clear in the passage of time. Also he is pleased about changing his seizure medication to him back which he hopes will have less side effects and help clear her sensorium.  LOS: 3 days  Followup with neurosurgery can be on a when necessary basis as neurology will be following the patient's for seizure medications and ultimate return to work activities.   Kristeen Miss 12/17/2013, 12:40 PM
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
## 2013-12-17 NOTE — Progress Notes (Signed)
Pt with recommendations for outpatient therapy. [facility] on [address] is what wife chose. Referral completed and signed by Dr. Georganna Skeans and faxed to outpt rehab location on [address] at 1543pm today for outpt OT and ST. Wife was given the original copy of the referral/Rx.

## 2013-12-18 NOTE — Discharge Summary (Signed)
Maziyah Vessel, MD, MPH, FACS Trauma: [phone] General Surgery: [phone]  

## 2013-12-24 ENCOUNTER — Ambulatory Visit: Payer: BC Managed Care – PPO | Attending: Internal Medicine | Admitting: Occupational Therapy

## 2013-12-24 ENCOUNTER — Ambulatory Visit: Payer: BC Managed Care – PPO

## 2013-12-24 DIAGNOSIS — R569 Unspecified convulsions: Secondary | ICD-10-CM | POA: Insufficient documentation

## 2013-12-24 DIAGNOSIS — R41841 Cognitive communication deficit: Secondary | ICD-10-CM | POA: Insufficient documentation

## 2013-12-24 DIAGNOSIS — R4701 Aphasia: Secondary | ICD-10-CM | POA: Insufficient documentation

## 2013-12-24 DIAGNOSIS — Z5189 Encounter for other specified aftercare: Secondary | ICD-10-CM | POA: Diagnosis present

## 2013-12-26 ENCOUNTER — Encounter: Payer: Self-pay | Admitting: Neurology

## 2013-12-26 ENCOUNTER — Ambulatory Visit (INDEPENDENT_AMBULATORY_CARE_PROVIDER_SITE_OTHER): Payer: BC Managed Care – PPO | Admitting: Neurology

## 2013-12-26 VITALS — BP 136/92 | HR 69 | Ht 74.5 in | Wt 171.0 lb

## 2013-12-26 DIAGNOSIS — W868XXA Exposure to other electric current, initial encounter: Secondary | ICD-10-CM

## 2013-12-26 DIAGNOSIS — T754XXA Electrocution, initial encounter: Secondary | ICD-10-CM

## 2013-12-26 DIAGNOSIS — R561 Post traumatic seizures: Secondary | ICD-10-CM

## 2013-12-26 DIAGNOSIS — F0781 Postconcussional syndrome: Secondary | ICD-10-CM

## 2013-12-26 HISTORY — DX: Postconcussional syndrome: F07.81

## 2013-12-26 MED ORDER — AMANTADINE HCL 100 MG PO CAPS: 100.0000 mg | ORAL_CAPSULE | Freq: Two times a day (BID) | ORAL | Status: DC

## 2013-12-26 NOTE — Patient Instructions (Signed)

## 2013-12-26 NOTE — Progress Notes (Signed)
Reason for visit: Seizures  Nicholas Perry is a 63 y.o. 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 The patient was unconscious for an undetermined period of time. The wife found him, and he was still breathing at that time. The patient had urinary incontinence when found. The wife estimates that he may have been unconscious for least 2 or 3 hours. The patient may have fallen from a ladder after he cut an electrical cord while trimming a tree. The electrical circuit did turn off. The patient was admitted to the hospital, intubated. MRI of the brain was normal, and there was no evidence of cervical fracture. The patient was treated with Vimpat, currently on 50 mg twice daily. The patient had a witnessed seizure in the hospital. Since discharge, the patient has had some frontal headaches that have been persistent. Evidence of maxillary sinusitis was present on MRI of the brain. The patient reports that there was injury to the left temporal area, right hip, and left 5th finger. The patient has had cognitive slowing, hesitancy of speech. He reports some blurring of vision, difficulty understanding what he is reading. He has not had a prolonged EEG study in the hospital was normal. He comes to this office for an evaluation. He has not had any further seizures since discharge. He is having some sensations of anxiety, however.  Past Medical History  Diagnosis Date  . Seizure after head injury 12/14/2013    Past Surgical History  Procedure Laterality Date  . Knee scopes Bilateral   . Nasal septum surgery      Family History  Problem Relation Age of Onset  . Hypertension Mother   . Hypertension Father   . Cancer - Other Father     Social history:  reports that he has never smoked. He has never used smokeless tobacco. He reports that he drinks alcohol. He reports that he does not use illicit  drugs.  Medications:  Current Outpatient Prescriptions on File Prior to Visit  Medication Sig Dispense Refill  . lacosamide (VIMPAT) 50 MG TABS tablet Take 1 tablet (50 mg total) by mouth 2 (two) times daily.  60 tablet  0  . triamcinolone (NASACORT) 55 MCG/ACT AERO nasal inhaler Place 1-2 sprays into the nose daily.       No current facility-administered medications on file prior to visit.      Allergies  Allergen Reactions  . Simvastatin     REACTION: weight loss, weakness    ROS:  Out of a complete 14 system review of symptoms, the patient complains only of the following symptoms, and all other reviewed systems are negative.  Fatigue Blurred vision Memory loss, headache, numbness, dizziness Anxiety, racing thoughts Sleepiness  Blood pressure 136/92, pulse 69, height 6' 2.5" (1.892 m), weight 171 lb (77.565 kg).  Physical Exam  General: The patient is alert and cooperative at the time of the examination.  Eyes: Pupils are equal, round, and reactive to light. Discs are flat bilaterally.  Neck: The neck is supple, no carotid bruits are noted.  Respiratory: The respiratory examination is clear.  Cardiovascular: The cardiovascular examination reveals a regular rate and rhythm, no obvious murmurs or rubs are noted.  Skin: Extremities are without significant edema.  Neurologic Exam  Mental status: The Mini-Mental status examination done today shows a total score 27/30.   Cranial nerves: Facial symmetry is present.  There is good sensation of the face to pinprick and soft touch bilaterally. The strength of the facial muscles and the muscles to head turning and shoulder shrug are normal bilaterally. Speech is hesitant, no definite aphasia is noted. Extraocular movements are full. Visual fields are full. The tongue is midline, and the patient has symmetric elevation of the soft palate. No obvious hearing deficits are noted.  Motor: The motor testing reveals 5 over 5 strength  of all 4 extremities. Good symmetric motor tone is noted throughout.  Sensory: Sensory testing is intact to pinprick, soft touch, vibration sensation, and position sense on all 4 extremities. No evidence of extinction is noted.  Coordination: Cerebellar testing reveals good finger-nose-finger and heel-to-shin bilaterally.  Gait and station: Gait is normal. Tandem gait is normal. Romberg is negative. No drift is seen.  Reflexes: Deep tendon reflexes are symmetric and normal bilaterally. Toes are downgoing bilaterally.    MRI brain 12/14/2013:  IMPRESSION:  1. Normal MRI appearance of the brain.  2. Diffuse sinus disease and bilateral mastoid effusions, likely  related to recent intubation.     Assessment/Plan:  1. Closed head injury  2. Seizure, posttraumatic  The patient will continue Vimpat, and continues for 6 months. He is not to operate a motor vehicle for 6 months following the last seizure. The patient will followup through this office in 3 months. He is not to return to work at this time. We may consider neuropsychological testing prior to returning to work. I suspect that the main issue at this point is a postconcussive syndrome. The patient denies any pain in the extremities that may be secondary to an electrical injury.   [name] 12/26/2013 8:04 PM  [facility] [address] [place], [place] [address]  Phone [phone] Fax [phone]

## 2013-12-27 ENCOUNTER — Ambulatory Visit: Payer: BC Managed Care – PPO | Admitting: Occupational Therapy

## 2013-12-27 ENCOUNTER — Ambulatory Visit: Payer: BC Managed Care – PPO

## 2013-12-27 DIAGNOSIS — Z0289 Encounter for other administrative examinations: Secondary | ICD-10-CM

## 2013-12-27 DIAGNOSIS — Z5189 Encounter for other specified aftercare: Secondary | ICD-10-CM | POA: Diagnosis not present

## 2013-12-28 DIAGNOSIS — Z0289 Encounter for other administrative examinations: Secondary | ICD-10-CM

## 2014-01-01 ENCOUNTER — Ambulatory Visit: Payer: BC Managed Care – PPO | Admitting: Speech Pathology

## 2014-01-01 DIAGNOSIS — Z5189 Encounter for other specified aftercare: Secondary | ICD-10-CM | POA: Diagnosis not present

## 2014-01-03 ENCOUNTER — Ambulatory Visit: Payer: BC Managed Care – PPO | Admitting: Occupational Therapy

## 2014-01-03 ENCOUNTER — Ambulatory Visit: Payer: BC Managed Care – PPO

## 2014-01-03 DIAGNOSIS — Z5189 Encounter for other specified aftercare: Secondary | ICD-10-CM | POA: Diagnosis not present

## 2014-01-04 ENCOUNTER — Ambulatory Visit: Payer: BC Managed Care – PPO

## 2014-01-04 DIAGNOSIS — Z5189 Encounter for other specified aftercare: Secondary | ICD-10-CM | POA: Diagnosis not present

## 2014-01-14 ENCOUNTER — Ambulatory Visit: Payer: BC Managed Care – PPO

## 2014-01-14 ENCOUNTER — Telehealth: Payer: Self-pay | Admitting: Neurology

## 2014-01-14 ENCOUNTER — Ambulatory Visit: Payer: BC Managed Care – PPO | Attending: Internal Medicine | Admitting: Occupational Therapy

## 2014-01-14 DIAGNOSIS — Z5189 Encounter for other specified aftercare: Secondary | ICD-10-CM | POA: Insufficient documentation

## 2014-01-14 DIAGNOSIS — R569 Unspecified convulsions: Secondary | ICD-10-CM | POA: Diagnosis not present

## 2014-01-14 DIAGNOSIS — R41841 Cognitive communication deficit: Secondary | ICD-10-CM | POA: Diagnosis not present

## 2014-01-14 DIAGNOSIS — R4701 Aphasia: Secondary | ICD-10-CM | POA: Insufficient documentation

## 2014-01-15 ENCOUNTER — Other Ambulatory Visit: Payer: Self-pay | Admitting: Neurology

## 2014-01-15 MED ORDER — LACOSAMIDE 50 MG PO TABS: 50.0000 mg | ORAL_TABLET | Freq: Two times a day (BID) | ORAL | Status: DC

## 2014-01-15 NOTE — Telephone Encounter (Signed)
Pt's Rx was given to Dr. Jannifer Franklin for signature.

## 2014-01-17 ENCOUNTER — Ambulatory Visit: Payer: BC Managed Care – PPO | Admitting: Occupational Therapy

## 2014-01-17 ENCOUNTER — Ambulatory Visit: Payer: BC Managed Care – PPO

## 2014-01-17 DIAGNOSIS — Z5189 Encounter for other specified aftercare: Secondary | ICD-10-CM | POA: Diagnosis not present

## 2014-01-18 ENCOUNTER — Ambulatory Visit: Payer: BC Managed Care – PPO

## 2014-01-18 DIAGNOSIS — Z5189 Encounter for other specified aftercare: Secondary | ICD-10-CM | POA: Diagnosis not present

## 2014-01-21 ENCOUNTER — Ambulatory Visit: Payer: BC Managed Care – PPO

## 2014-01-21 DIAGNOSIS — Z5189 Encounter for other specified aftercare: Secondary | ICD-10-CM | POA: Diagnosis not present

## 2014-01-22 ENCOUNTER — Ambulatory Visit: Payer: BC Managed Care – PPO | Admitting: Occupational Therapy

## 2014-01-22 ENCOUNTER — Ambulatory Visit: Payer: BC Managed Care – PPO

## 2014-01-22 DIAGNOSIS — Z5189 Encounter for other specified aftercare: Secondary | ICD-10-CM | POA: Diagnosis not present

## 2014-01-23 ENCOUNTER — Ambulatory Visit: Payer: BC Managed Care – PPO | Admitting: Speech Pathology

## 2014-01-23 DIAGNOSIS — Z5189 Encounter for other specified aftercare: Secondary | ICD-10-CM | POA: Diagnosis not present

## 2014-01-25 ENCOUNTER — Other Ambulatory Visit: Payer: Self-pay | Admitting: Orthopedic Surgery

## 2014-01-25 DIAGNOSIS — M25512 Pain in left shoulder: Secondary | ICD-10-CM

## 2014-01-29 ENCOUNTER — Ambulatory Visit: Payer: BC Managed Care – PPO | Admitting: Speech Pathology

## 2014-01-29 ENCOUNTER — Ambulatory Visit: Payer: BC Managed Care – PPO | Admitting: Occupational Therapy

## 2014-01-29 DIAGNOSIS — Z5189 Encounter for other specified aftercare: Secondary | ICD-10-CM | POA: Diagnosis not present

## 2014-01-31 ENCOUNTER — Ambulatory Visit: Payer: BC Managed Care – PPO | Admitting: Speech Pathology

## 2014-01-31 ENCOUNTER — Ambulatory Visit: Payer: BC Managed Care – PPO | Admitting: Occupational Therapy

## 2014-01-31 DIAGNOSIS — Z5189 Encounter for other specified aftercare: Secondary | ICD-10-CM | POA: Diagnosis not present

## 2014-02-01 ENCOUNTER — Ambulatory Visit
Admission: RE | Admit: 2014-02-01 | Discharge: 2014-02-01 | Disposition: A | Discharge status: Home / Self Care | Payer: BC Managed Care – PPO | Source: Ambulatory Visit | Attending: Orthopedic Surgery | Admitting: Orthopedic Surgery

## 2014-02-01 DIAGNOSIS — M25512 Pain in left shoulder: Secondary | ICD-10-CM

## 2014-02-04 ENCOUNTER — Ambulatory Visit: Payer: BC Managed Care – PPO | Admitting: Occupational Therapy

## 2014-02-04 ENCOUNTER — Ambulatory Visit: Payer: BC Managed Care – PPO

## 2014-02-04 ENCOUNTER — Other Ambulatory Visit: Payer: Self-pay | Admitting: Neurology

## 2014-02-04 DIAGNOSIS — Z5189 Encounter for other specified aftercare: Secondary | ICD-10-CM | POA: Diagnosis not present

## 2014-02-04 MED ORDER — LACOSAMIDE 50 MG PO TABS: 50.0000 mg | ORAL_TABLET | Freq: Two times a day (BID) | ORAL | Status: DC

## 2014-02-04 NOTE — Telephone Encounter (Signed)
Rx signed and faxed.

## 2014-02-04 NOTE — Telephone Encounter (Signed)
Request forwarded to provider for approval  

## 2014-02-05 ENCOUNTER — Other Ambulatory Visit: Payer: BC Managed Care – PPO

## 2014-02-06 ENCOUNTER — Ambulatory Visit: Payer: BC Managed Care – PPO

## 2014-02-06 ENCOUNTER — Ambulatory Visit: Payer: BC Managed Care – PPO | Attending: Internal Medicine | Admitting: Occupational Therapy

## 2014-02-06 DIAGNOSIS — Z0289 Encounter for other administrative examinations: Secondary | ICD-10-CM

## 2014-02-06 DIAGNOSIS — Z5189 Encounter for other specified aftercare: Secondary | ICD-10-CM | POA: Insufficient documentation

## 2014-02-06 DIAGNOSIS — R41841 Cognitive communication deficit: Secondary | ICD-10-CM | POA: Insufficient documentation

## 2014-02-06 DIAGNOSIS — R569 Unspecified convulsions: Secondary | ICD-10-CM | POA: Insufficient documentation

## 2014-02-06 DIAGNOSIS — R4701 Aphasia: Secondary | ICD-10-CM | POA: Insufficient documentation

## 2014-02-07 ENCOUNTER — Ambulatory Visit: Payer: BC Managed Care – PPO

## 2014-02-07 DIAGNOSIS — Z5189 Encounter for other specified aftercare: Secondary | ICD-10-CM | POA: Diagnosis not present

## 2014-02-11 ENCOUNTER — Encounter: Payer: BC Managed Care – PPO | Admitting: Occupational Therapy

## 2014-02-12 ENCOUNTER — Ambulatory Visit: Payer: BC Managed Care – PPO | Admitting: Occupational Therapy

## 2014-02-12 ENCOUNTER — Ambulatory Visit: Payer: BC Managed Care – PPO

## 2014-02-12 DIAGNOSIS — Z5189 Encounter for other specified aftercare: Secondary | ICD-10-CM | POA: Diagnosis not present

## 2014-02-13 ENCOUNTER — Ambulatory Visit: Payer: BC Managed Care – PPO

## 2014-02-13 ENCOUNTER — Ambulatory Visit: Payer: BC Managed Care – PPO | Admitting: Occupational Therapy

## 2014-02-13 DIAGNOSIS — Z5189 Encounter for other specified aftercare: Secondary | ICD-10-CM | POA: Diagnosis not present

## 2014-02-15 ENCOUNTER — Ambulatory Visit: Payer: BC Managed Care – PPO

## 2014-02-15 DIAGNOSIS — Z5189 Encounter for other specified aftercare: Secondary | ICD-10-CM | POA: Diagnosis not present

## 2014-02-18 ENCOUNTER — Ambulatory Visit: Payer: BC Managed Care – PPO | Admitting: Occupational Therapy

## 2014-02-18 ENCOUNTER — Ambulatory Visit: Payer: BC Managed Care – PPO

## 2014-02-18 DIAGNOSIS — Z5189 Encounter for other specified aftercare: Secondary | ICD-10-CM | POA: Diagnosis not present

## 2014-02-20 ENCOUNTER — Ambulatory Visit: Payer: BC Managed Care – PPO | Admitting: Occupational Therapy

## 2014-02-20 ENCOUNTER — Ambulatory Visit: Payer: BC Managed Care – PPO

## 2014-02-20 DIAGNOSIS — Z5189 Encounter for other specified aftercare: Secondary | ICD-10-CM | POA: Diagnosis not present

## 2014-02-22 ENCOUNTER — Ambulatory Visit: Payer: BC Managed Care – PPO

## 2014-02-22 DIAGNOSIS — Z5189 Encounter for other specified aftercare: Secondary | ICD-10-CM | POA: Diagnosis not present

## 2014-02-25 ENCOUNTER — Encounter: Payer: Self-pay | Admitting: Internal Medicine

## 2014-02-25 ENCOUNTER — Encounter: Payer: BC Managed Care – PPO | Admitting: Occupational Therapy

## 2014-02-26 DIAGNOSIS — S069XAS Unspecified intracranial injury with loss of consciousness status unknown, sequela: Secondary | ICD-10-CM

## 2014-02-26 DIAGNOSIS — X58XXXA Exposure to other specified factors, initial encounter: Secondary | ICD-10-CM

## 2014-02-26 DIAGNOSIS — S069X9S Unspecified intracranial injury with loss of consciousness of unspecified duration, sequela: Secondary | ICD-10-CM

## 2014-02-27 ENCOUNTER — Ambulatory Visit: Payer: BC Managed Care – PPO

## 2014-02-27 ENCOUNTER — Ambulatory Visit: Payer: BC Managed Care – PPO | Admitting: Occupational Therapy

## 2014-02-27 DIAGNOSIS — Z5189 Encounter for other specified aftercare: Secondary | ICD-10-CM | POA: Diagnosis not present

## 2014-03-13 DIAGNOSIS — X58XXXA Exposure to other specified factors, initial encounter: Secondary | ICD-10-CM

## 2014-03-13 DIAGNOSIS — S069X9S Unspecified intracranial injury with loss of consciousness of unspecified duration, sequela: Secondary | ICD-10-CM

## 2014-03-13 DIAGNOSIS — S069XAS Unspecified intracranial injury with loss of consciousness status unknown, sequela: Secondary | ICD-10-CM | POA: Diagnosis not present

## 2014-03-14 DIAGNOSIS — Z0289 Encounter for other administrative examinations: Secondary | ICD-10-CM

## 2014-03-19 ENCOUNTER — Ambulatory Visit: Payer: BC Managed Care – PPO | Attending: Internal Medicine | Admitting: Occupational Therapy

## 2014-03-19 ENCOUNTER — Ambulatory Visit: Payer: BC Managed Care – PPO

## 2014-03-19 DIAGNOSIS — Z5189 Encounter for other specified aftercare: Secondary | ICD-10-CM | POA: Insufficient documentation

## 2014-03-19 DIAGNOSIS — R41841 Cognitive communication deficit: Secondary | ICD-10-CM | POA: Insufficient documentation

## 2014-03-19 DIAGNOSIS — R4701 Aphasia: Secondary | ICD-10-CM | POA: Diagnosis not present

## 2014-03-19 DIAGNOSIS — R569 Unspecified convulsions: Secondary | ICD-10-CM | POA: Insufficient documentation

## 2014-03-21 ENCOUNTER — Ambulatory Visit: Payer: BC Managed Care – PPO | Admitting: Occupational Therapy

## 2014-03-21 ENCOUNTER — Ambulatory Visit: Payer: BC Managed Care – PPO

## 2014-03-21 DIAGNOSIS — Z5189 Encounter for other specified aftercare: Secondary | ICD-10-CM | POA: Diagnosis not present

## 2014-03-25 ENCOUNTER — Ambulatory Visit: Payer: BC Managed Care – PPO

## 2014-03-25 ENCOUNTER — Ambulatory Visit: Payer: BC Managed Care – PPO | Admitting: Occupational Therapy

## 2014-03-25 DIAGNOSIS — Z5189 Encounter for other specified aftercare: Secondary | ICD-10-CM | POA: Diagnosis not present

## 2014-03-26 ENCOUNTER — Telehealth: Payer: Self-pay | Admitting: Neurology

## 2014-03-26 NOTE — Telephone Encounter (Signed)
Noted  

## 2014-03-26 NOTE — Telephone Encounter (Signed)
I called patient. 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 He has difficulty filtering out external distractions, with a mild impairment of executive attentional control the patient will lose his train of thought recently. There appears to be some verbal apraxia. Some word finding difficulty is present.  I would wonder whether or not use of amantadine may be of some benefit. This has been shown to help cognitive issues with post traumatic brain injury. They are to contact me back if they wish to go on this medication.

## 2014-03-26 NOTE — Telephone Encounter (Signed)
FYI: patient has been on Amantadine for about 2 months.

## 2014-03-26 NOTE — Telephone Encounter (Signed)
Patient calling to state that he already started Amantadine and was on it for 60 days, requesting another call back from Dr. Jannifer Franklin, please return call and advise.

## 2014-03-27 DIAGNOSIS — Z0289 Encounter for other administrative examinations: Secondary | ICD-10-CM

## 2014-03-28 ENCOUNTER — Ambulatory Visit: Payer: BC Managed Care – PPO | Admitting: Occupational Therapy

## 2014-03-28 ENCOUNTER — Ambulatory Visit: Payer: BC Managed Care – PPO

## 2014-03-28 DIAGNOSIS — Z5189 Encounter for other specified aftercare: Secondary | ICD-10-CM | POA: Diagnosis not present

## 2014-04-09 ENCOUNTER — Ambulatory Visit: Payer: BC Managed Care – PPO | Attending: Internal Medicine | Admitting: Occupational Therapy

## 2014-04-09 DIAGNOSIS — R4701 Aphasia: Secondary | ICD-10-CM | POA: Diagnosis not present

## 2014-04-09 DIAGNOSIS — R41841 Cognitive communication deficit: Secondary | ICD-10-CM | POA: Insufficient documentation

## 2014-04-09 DIAGNOSIS — Z5189 Encounter for other specified aftercare: Secondary | ICD-10-CM | POA: Diagnosis present

## 2014-04-09 DIAGNOSIS — R569 Unspecified convulsions: Secondary | ICD-10-CM | POA: Diagnosis not present

## 2014-04-11 ENCOUNTER — Ambulatory Visit (INDEPENDENT_AMBULATORY_CARE_PROVIDER_SITE_OTHER): Payer: BC Managed Care – PPO | Admitting: Neurology

## 2014-04-11 ENCOUNTER — Ambulatory Visit: Payer: BC Managed Care – PPO | Admitting: Occupational Therapy

## 2014-04-11 ENCOUNTER — Encounter: Payer: Self-pay | Admitting: Neurology

## 2014-04-11 ENCOUNTER — Ambulatory Visit: Payer: BC Managed Care – PPO

## 2014-04-11 VITALS — BP 136/90 | HR 56 | Wt 174.0 lb

## 2014-04-11 DIAGNOSIS — R561 Post traumatic seizures: Secondary | ICD-10-CM

## 2014-04-11 DIAGNOSIS — F0781 Postconcussional syndrome: Secondary | ICD-10-CM

## 2014-04-11 DIAGNOSIS — Z5189 Encounter for other specified aftercare: Secondary | ICD-10-CM | POA: Diagnosis not present

## 2014-04-11 MED ORDER — AMPHETAMINE-DEXTROAMPHETAMINE 15 MG PO TABS: 15.0000 mg | ORAL_TABLET | Freq: Every day | ORAL | Status: DC

## 2014-04-11 NOTE — Progress Notes (Signed)
Reason for visit: Post concussive syndrome  Nicholas Perry is an 63 y.o. 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 The patient had a seizure around the time of the fall. He has been on Vimpat, and he has not had any further events. The patient continues to have problems with word finding, and a stuttering speech pattern. He is having some fatigue, and he takes a nap daily. The patient was placed on Symmetrel, but he developed a rash to it, and had to stop the medication. The has some tingling sensations on the tips of the fingers, and the tip of the tongue. The patient denies any alteration in taste. He is having some vivid dreams at night, but usually he sleeps relatively well. He denies any severe gait instability, but he does use a walking stick at times when he is walking on uneven ground. He remains at work. He does not operate a motor vehicle. He denies issues controlling the bowels or the bladder. He recently had an esophageal dilation as he was having some choking feelings with swallowing. This helped the swallowing issue. He returns for an evaluation. Formal neuropsychological evaluation has been done, and did show cognitive dysfunction.  Past Medical History  Diagnosis Date  . Seizure after head injury 12/14/2013  . Post concussive encephalopathy 12/26/2013    Past Surgical History  Procedure Laterality Date  . Knee scopes Bilateral   . Nasal septum surgery      Family History  Problem Relation Age of Onset  . Hypertension Mother   . Hypertension Father   . Cancer - Other Father     Social history:  reports that he has never smoked. He has never used smokeless tobacco. He reports that he drinks alcohol. He reports that he does not use illicit drugs.    Allergies  Allergen Reactions  . Simvastatin     REACTION: weight loss, weakness    Medications:  Current Outpatient Prescriptions on  File Prior to Visit  Medication Sig Dispense Refill  . acetaminophen (TYLENOL) 500 MG tablet Take 500 mg by mouth every 6 (six) hours as needed.      . lacosamide (VIMPAT) 50 MG TABS tablet Take 1 tablet (50 mg total) by mouth 2 (two) times daily.  60 tablet  5   No current facility-administered medications on file prior to visit.    ROS:  Out of a complete 14 system review of symptoms, the patient complains only of the following symptoms, and all other reviewed systems are negative.  Ringing in the ears Light sensitivity, blurred vision Memory loss, headache, numbness, speech difficulty Decreased concentration Anxiety Frequent waking, daytime sleepiness  Blood pressure 136/90, pulse 56, weight 174 lb (78.926 kg).  Physical Exam  General: The patient is alert and cooperative at the time of the examination.  Skin: No significant peripheral edema is noted.   Neurologic Exam  Mental status: The Mini-Mental status examination done today shows a total score 28/30.  Cranial nerves: Facial symmetry is present. Speech is hesitant, stuttering pattern, word finding problems. Extraocular movements are full. Visual fields are full.  Motor: The patient has good strength in all 4 extremities.  Sensory examination: Soft touch sensation is symmetric on the face, arms, and legs.  Coordination: The patient has good finger-nose-finger and heel-to-shin bilaterally.  Gait and station: The patient has a normal gait. Tandem gait is slightly unsteady.  Romberg is negative. No drift is seen.  Reflexes: Deep tendon reflexes are symmetric.   Assessment/Plan:  1. Chronic post concussive syndrome  The patient unfortunately continues to have cognitive issues following the fall. He is concerned that he has not made a better recovery. The patient mainly has issues with word finding, and has a stuttering speech pattern. He reports difficulty retaining information that he reads. The patient will remain  on Vimpat for now, we will check an EEG study in November 2015, and we will consider a taper off of the Vimpat. We will give him a trial on Adderall in low dose. The patient will followup sometime in December 2015. He will have a formal neuropsychological evaluation again in November.  [name] 04/11/2014 7:42 PM  [facility] [address] [place],  [address]  Phone [phone] Fax [phone]

## 2014-04-11 NOTE — Patient Instructions (Signed)
Post-Concussion Syndrome Post-concussion syndrome describes the symptoms that can occur after a head injury. These symptoms can last from weeks to months. CAUSES  It is not clear why some head injuries cause post-concussion syndrome. It can occur whether your head injury was mild or severe and whether you were wearing head protection or not.  SIGNS AND SYMPTOMS  Memory difficulties.  Dizziness.  Headaches.  Double vision or blurry vision.  Sensitivity to light.  Hearing difficulties.  Depression.  Tiredness.  Weakness.  Difficulty with concentration.  Difficulty sleeping or staying asleep.  Vomiting.  Poor balance or instability on your feet.  Slow reaction time.  Difficulty learning and remembering things you have heard. DIAGNOSIS  There is no test to determine whether you have post-concussion syndrome. Your health care provider may order an imaging scan of your brain, such as a CT scan, to check for other problems that may be causing your symptoms (such as severe injury inside your skull). TREATMENT  Usually, these problems disappear over time without medical care. Your health care provider may prescribe medicine to help ease your symptoms. It is important to follow up with a neurologist to evaluate your recovery and address any lingering symptoms or issues. HOME CARE INSTRUCTIONS   Only take over-the-counter or prescription medicines for pain, discomfort, or fever as directed by your health care provider. Do not take aspirin. Aspirin can slow blood clotting.  Sleep with your head slightly elevated to help with headaches.  Avoid any situation where there is potential for another head injury (football, hockey, soccer, basketball, martial arts, downhill snow sports, and horseback riding). Your condition will get worse every time you experience a concussion. You should avoid these activities until you are evaluated by the appropriate follow-up health care  providers.  Keep all follow-up appointments as directed by your health care provider. SEEK IMMEDIATE MEDICAL CARE IF:  You develop confusion or unusual drowsiness.  You cannot wake the injured person.  You develop nausea or persistent, forceful vomiting.  You feel like you are moving when you are not (vertigo).  You notice the injured person's eyes moving rapidly back and forth. This may be a sign of vertigo.  You have convulsions or faint.  You have severe, persistent headaches that are not relieved by medicine.  You cannot use your arms or legs normally.  Your pupils change size.  You have clear or bloody discharge from the nose or ears.  Your problems are getting worse, not better. MAKE SURE YOU:  Understand these instructions.  Will watch your condition.  Will get help right away if you are not doing well or get worse. Document Released: 01/15/2002 Document Revised: 05/16/2013 Document Reviewed: 10/31/2013 Exit[employer] Patient Information 2015 [employer]. This information is not intended to replace advice given to you by your health care provider. Make sure you discuss any questions you have with your health care provider.  

## 2014-04-12 ENCOUNTER — Ambulatory Visit: Payer: BC Managed Care – PPO

## 2014-04-12 DIAGNOSIS — Z5189 Encounter for other specified aftercare: Secondary | ICD-10-CM | POA: Diagnosis not present

## 2014-04-16 ENCOUNTER — Ambulatory Visit: Payer: BC Managed Care – PPO | Admitting: Occupational Therapy

## 2014-04-16 DIAGNOSIS — Z5189 Encounter for other specified aftercare: Secondary | ICD-10-CM | POA: Diagnosis not present

## 2014-04-17 ENCOUNTER — Ambulatory Visit: Payer: BC Managed Care – PPO

## 2014-04-17 DIAGNOSIS — Z5189 Encounter for other specified aftercare: Secondary | ICD-10-CM | POA: Diagnosis not present

## 2014-04-18 ENCOUNTER — Ambulatory Visit: Payer: BC Managed Care – PPO | Admitting: Occupational Therapy

## 2014-04-18 DIAGNOSIS — Z5189 Encounter for other specified aftercare: Secondary | ICD-10-CM | POA: Diagnosis not present

## 2014-04-19 ENCOUNTER — Ambulatory Visit: Payer: BC Managed Care – PPO

## 2014-04-19 DIAGNOSIS — Z5189 Encounter for other specified aftercare: Secondary | ICD-10-CM | POA: Diagnosis not present

## 2014-04-23 ENCOUNTER — Ambulatory Visit: Payer: BC Managed Care – PPO | Admitting: Occupational Therapy

## 2014-04-23 DIAGNOSIS — Z5189 Encounter for other specified aftercare: Secondary | ICD-10-CM | POA: Diagnosis not present

## 2014-04-25 ENCOUNTER — Ambulatory Visit: Payer: BC Managed Care – PPO | Admitting: Occupational Therapy

## 2014-04-25 ENCOUNTER — Ambulatory Visit: Payer: BC Managed Care – PPO

## 2014-04-25 DIAGNOSIS — Z5189 Encounter for other specified aftercare: Secondary | ICD-10-CM | POA: Diagnosis not present

## 2014-04-30 ENCOUNTER — Encounter: Payer: BC Managed Care – PPO | Admitting: Occupational Therapy

## 2014-05-02 ENCOUNTER — Ambulatory Visit: Payer: BC Managed Care – PPO | Admitting: Occupational Therapy

## 2014-05-02 ENCOUNTER — Ambulatory Visit: Payer: BC Managed Care – PPO

## 2014-05-02 DIAGNOSIS — Z5189 Encounter for other specified aftercare: Secondary | ICD-10-CM | POA: Diagnosis not present

## 2014-05-07 ENCOUNTER — Ambulatory Visit: Payer: BC Managed Care – PPO | Admitting: Occupational Therapy

## 2014-05-07 DIAGNOSIS — Z5189 Encounter for other specified aftercare: Secondary | ICD-10-CM | POA: Diagnosis not present

## 2014-05-13 ENCOUNTER — Other Ambulatory Visit: Payer: Self-pay | Admitting: Neurology

## 2014-05-13 NOTE — Telephone Encounter (Signed)
Patient's wife calling on behalf of patient, requesting script for Adderall since he has almost finished his one month supply, please return call and advise.

## 2014-05-13 NOTE — Telephone Encounter (Signed)
Dr Willis is out of the office.  Forwarding request to [facility] for review.  

## 2014-05-14 ENCOUNTER — Telehealth: Payer: Self-pay | Admitting: Neurology

## 2014-05-14 ENCOUNTER — Other Ambulatory Visit: Payer: Self-pay | Admitting: Neurology

## 2014-05-14 ENCOUNTER — Ambulatory Visit: Payer: BC Managed Care – PPO | Attending: Internal Medicine | Admitting: Occupational Therapy

## 2014-05-14 DIAGNOSIS — G40909 Epilepsy, unspecified, not intractable, without status epilepticus: Secondary | ICD-10-CM | POA: Insufficient documentation

## 2014-05-14 DIAGNOSIS — R41841 Cognitive communication deficit: Secondary | ICD-10-CM | POA: Insufficient documentation

## 2014-05-14 DIAGNOSIS — R4701 Aphasia: Secondary | ICD-10-CM | POA: Insufficient documentation

## 2014-05-14 MED ORDER — AMPHETAMINE-DEXTROAMPHETAMINE 15 MG PO TABS: 15.0000 mg | ORAL_TABLET | Freq: Every day | ORAL | Status: DC

## 2014-05-14 NOTE — Telephone Encounter (Signed)
Pt's wife Lynelle Smoke is in the lobby requesting a refill for Adderall for the pt. Please advise

## 2014-05-14 NOTE — Telephone Encounter (Signed)
A prescription was written for Adderall today. The wife picked up the prescription.

## 2014-05-30 ENCOUNTER — Encounter: Payer: BC Managed Care – PPO | Admitting: Occupational Therapy

## 2014-05-30 ENCOUNTER — Ambulatory Visit: Payer: BC Managed Care – PPO

## 2014-05-30 DIAGNOSIS — R4701 Aphasia: Secondary | ICD-10-CM | POA: Diagnosis not present

## 2014-05-31 ENCOUNTER — Telehealth: Payer: Self-pay | Admitting: *Deleted

## 2014-05-31 DIAGNOSIS — Z0289 Encounter for other administrative examinations: Secondary | ICD-10-CM

## 2014-05-31 NOTE — Telephone Encounter (Signed)
Form,[employer] to Nurse Butch Penny on 05-31-14.

## 2014-06-05 ENCOUNTER — Other Ambulatory Visit: Payer: Self-pay | Admitting: Neurology

## 2014-06-05 DIAGNOSIS — R561 Post traumatic seizures: Secondary | ICD-10-CM

## 2014-06-06 ENCOUNTER — Encounter: Payer: BC Managed Care – PPO | Admitting: Occupational Therapy

## 2014-06-10 ENCOUNTER — Ambulatory Visit (INDEPENDENT_AMBULATORY_CARE_PROVIDER_SITE_OTHER): Payer: BC Managed Care – PPO

## 2014-06-10 ENCOUNTER — Telehealth: Payer: Self-pay | Admitting: Neurology

## 2014-06-10 DIAGNOSIS — R561 Post traumatic seizures: Secondary | ICD-10-CM

## 2014-06-10 NOTE — Procedures (Signed)
    History:  Nicholas Perry is a 63 year old patient with a history of an electrocution injury in a fall that occurred on 12/14/2013 associated with a closed head injury. The patient has had some cognitive changes since that time, and he is being evaluated for this issue. The patient had a seizure around the time of the fall.  This is a routine EEG. No skull defects are noted. Medications include Tylenol, Adderall, and Vimpat.   EEG classification: Normal awake  Description of the recording: The background rhythms of this recording consists of a fairly well modulated medium amplitude alpha rhythm of 9 Hz that is reactive to eye opening and closure. As the record progresses, the patient appears to remain in the waking state throughout the recording. Photic stimulation was performed, resulting in a bilateral and symmetric photic driving response. Hyperventilation was also performed, resulting in a minimal buildup of the background rhythm activities without significant slowing seen. At no time during the recording does there appear to be evidence of spike or spike wave discharges or evidence of focal slowing. EKG monitor shows occasional premature ventricular complexes with a heart rate of 56.  Impression: This is a normal EEG recording in the waking state. No evidence of ictal or interictal discharges are seen.

## 2014-06-10 NOTE — Telephone Encounter (Signed)
I called the patient. The EEG study was unremarkable.I discussed coming off the Vimpat, the patient will go to 50 mg at night for 2 weeks, then stop the medication. It is not clear whether the Adderall is helpful, they may decide to stop this medication as well in the future.

## 2014-06-12 ENCOUNTER — Other Ambulatory Visit: Payer: Self-pay | Admitting: Neurology

## 2014-06-12 DIAGNOSIS — F068 Other specified mental disorders due to known physiological condition: Secondary | ICD-10-CM

## 2014-06-12 DIAGNOSIS — S069X9S Unspecified intracranial injury with loss of consciousness of unspecified duration, sequela: Secondary | ICD-10-CM

## 2014-06-12 MED ORDER — AMPHETAMINE-DEXTROAMPHETAMINE 15 MG PO TABS: 15.0000 mg | ORAL_TABLET | Freq: Every day | ORAL | Status: DC

## 2014-06-12 NOTE — Telephone Encounter (Signed)
Patient needs refill of Adderall. He uses [facility] on [address]. His best call back # is [phone].

## 2014-06-12 NOTE — Telephone Encounter (Signed)
Request entered, forwarded to provider for approval.  

## 2014-06-13 NOTE — Telephone Encounter (Signed)
I called the patient to let them know their Rx for Adderall was ready for pickup. Patient was instructed to bring Photo ID. 

## 2014-06-13 NOTE — Addendum Note (Signed)
Addended by: Simonne Maffucci on: 06/13/2014 09:47 AM   Modules accepted: Medications

## 2014-06-18 NOTE — Telephone Encounter (Signed)
Form,[employer] received from Dr Jannifer Franklin and Nurse faxed 06-14-14.

## 2014-06-19 ENCOUNTER — Telehealth: Payer: Self-pay | Admitting: Neurology

## 2014-06-19 DIAGNOSIS — S069X9S Unspecified intracranial injury with loss of consciousness of unspecified duration, sequela: Secondary | ICD-10-CM

## 2014-06-19 DIAGNOSIS — F068 Other specified mental disorders due to known physiological condition: Secondary | ICD-10-CM

## 2014-06-19 NOTE — Telephone Encounter (Signed)
I called the patient. The nurse psychological evaluation that was repeated showed no definite improvement from the prior evaluation. 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  The patient is applying for permanent disability, which seems appropriate this time.

## 2014-07-16 ENCOUNTER — Other Ambulatory Visit: Payer: Self-pay | Admitting: Neurology

## 2014-07-16 MED ORDER — AMPHETAMINE-DEXTROAMPHETAMINE 15 MG PO TABS: 15.0000 mg | ORAL_TABLET | Freq: Every day | ORAL | Status: DC

## 2014-07-16 NOTE — Telephone Encounter (Signed)
Patient is calling to get a written Rx for Adderal. Please call patient when ready for pickup. Thank you. °

## 2014-07-16 NOTE — Telephone Encounter (Addendum)
I called the patient to let them know their Rx for Adderall was ready for pickup. Patient was instructed to bring Photo ID.  Tammy, his wife will be picking up the Rx for him. Her DOB:  03/28/76

## 2014-07-16 NOTE — Telephone Encounter (Signed)
Request entered, forwarded to provider for approval.  

## 2014-07-17 ENCOUNTER — Telehealth: Payer: Self-pay | Admitting: *Deleted

## 2014-07-17 DIAGNOSIS — Z0289 Encounter for other administrative examinations: Secondary | ICD-10-CM

## 2014-07-17 NOTE — Telephone Encounter (Signed)
Form,[employer] to Nurse Butch Penny and Dr Jannifer Franklin to be completed 07-17-14.

## 2014-07-18 ENCOUNTER — Encounter: Payer: Self-pay | Admitting: Family Medicine

## 2014-07-18 ENCOUNTER — Ambulatory Visit (INDEPENDENT_AMBULATORY_CARE_PROVIDER_SITE_OTHER): Payer: BC Managed Care – PPO | Admitting: Family Medicine

## 2014-07-18 VITALS — BP 130/98 | HR 60 | Temp 98.2°F | Wt 174.0 lb

## 2014-07-18 DIAGNOSIS — F0781 Postconcussional syndrome: Secondary | ICD-10-CM

## 2014-07-18 DIAGNOSIS — I10 Essential (primary) hypertension: Secondary | ICD-10-CM

## 2014-07-18 DIAGNOSIS — Z23 Encounter for immunization: Secondary | ICD-10-CM

## 2014-07-18 DIAGNOSIS — N400 Enlarged prostate without lower urinary tract symptoms: Secondary | ICD-10-CM

## 2014-07-18 MED ORDER — DOXAZOSIN MESYLATE 1 MG PO TABS: 1.0000 mg | ORAL_TABLET | Freq: Every day | ORAL | Status: DC

## 2014-07-18 NOTE — Assessment & Plan Note (Signed)
Try low dose cardura 1mg  daily in AM. Warned orthostasis. Need to get AUA score as titrate.

## 2014-07-18 NOTE — Progress Notes (Signed)
Pre visit review using our clinic review tool, if applicable. No additional management support is needed unless otherwise documented below in the visit note. 

## 2014-07-18 NOTE — Assessment & Plan Note (Signed)
Mild poor diastolic control. Start low dose cardura to see if this helps with BPH and HTN. Follow up 1 month, titrate slowly--> 2, 4, 8 mg if needed to achieve success.

## 2014-07-18 NOTE — Assessment & Plan Note (Addendum)
Continues to follow with neurology and has neuro rehab. Continued issues: Word finding/stuttering persist/difficulty reading. Lost propioception lost on all 10 fingers, bites tongue and cannot feel. Frequent PVCs no a fib. These seem to be slowly improving, he will have repeat neuropsychological testing in May 2016. Continue neuro follow up.

## 2014-07-18 NOTE — Patient Instructions (Addendum)
Shingles shot today  Start low dose cardura/doxazosin 1mg   Follow up in 1 month  Watch out for othostatic hypotension-main side effect but that's why we are going slow.
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
## 2014-07-23 ENCOUNTER — Telehealth: Payer: Self-pay | Admitting: *Deleted

## 2014-07-23 NOTE — Telephone Encounter (Signed)
Form,[employer] received,completed by Dr Jann[facility] and Nurse Butch Penny at front desk for patient 07/23/14.

## 2014-07-23 NOTE — Telephone Encounter (Signed)
Signed off and taken to Medical Records for processing.

## 2014-07-23 NOTE — Telephone Encounter (Signed)
Form has been completed and given to [facility] for medical records.

## 2014-07-24 ENCOUNTER — Telehealth: Payer: Self-pay | Admitting: *Deleted

## 2014-07-24 NOTE — Telephone Encounter (Signed)
Form,[employer] received,completed by Dr Jann[facility] and Nurse Butch Penny at [facility] for patient.

## 2014-07-29 ENCOUNTER — Telehealth: Payer: Self-pay | Admitting: Neurology

## 2014-07-29 NOTE — Telephone Encounter (Signed)
Rolena Infante with [employer] is calling regarding disability forms that was filled out for the patient. Mr. Carole Binning has questions. Please call.

## 2014-08-06 NOTE — Telephone Encounter (Signed)
Left message that I was returning his call.

## 2014-08-06 NOTE — Telephone Encounter (Signed)
Spoke to [employer] with [facility] and he needs the patient's disability corrected since the patient is now driving.  And also a time frame for the restrictions and limitations.  I will review form with physician.

## 2014-08-06 NOTE — Telephone Encounter (Signed)
Nicholas Perry with [facility] is returning a call regarding patient. You can reach Nicholas Perry until 4:45pm today or tomorrow 8:00am until 4:45pm. Thank you.

## 2014-08-08 NOTE — Telephone Encounter (Signed)
The form has been corrected and faxed with confirmation received.

## 2014-08-12 DIAGNOSIS — Z0289 Encounter for other administrative examinations: Secondary | ICD-10-CM

## 2014-08-21 ENCOUNTER — Ambulatory Visit (INDEPENDENT_AMBULATORY_CARE_PROVIDER_SITE_OTHER): Payer: BLUE CROSS/BLUE SHIELD | Admitting: Family Medicine

## 2014-08-21 ENCOUNTER — Encounter: Payer: Self-pay | Admitting: Family Medicine

## 2014-08-21 VITALS — BP 130/92 | HR 60 | Temp 98.2°F | Wt 178.0 lb

## 2014-08-21 DIAGNOSIS — I10 Essential (primary) hypertension: Secondary | ICD-10-CM

## 2014-08-21 DIAGNOSIS — I480 Paroxysmal atrial fibrillation: Secondary | ICD-10-CM

## 2014-08-21 MED ORDER — LISINOPRIL 5 MG PO TABS: 5.0000 mg | ORAL_TABLET | Freq: Every day | ORAL | Status: DC

## 2014-08-21 NOTE — Patient Instructions (Addendum)
Continue off of cardura.   Start lisinopril 5mg .   Keep monitoring blood pressure at home. If above 140/90 then please see me back.   Check kidney function in 2 weeks. Schedule lab visit at the front.   Continue aspirin. If you continue to have atrial fibrillation episodes

## 2014-08-21 NOTE — Assessment & Plan Note (Signed)
Stop cardura (see a fib). Start lisinopril 5mg  with continued poor diastolic control. BMET 2 weeks. Patient to follow up at home with 3 month repeat in office.

## 2014-08-21 NOTE — Assessment & Plan Note (Signed)
Cardura seemed to make patient go into atrial fibrillation and cause runny nose. A fib episodes stopped within 48 hours of stopping cardura. Patient self restarted aspirin thankfully and appropriate with chadsvasc score of 1. Hopeful no recurrence off medicine or on new lisinopril for BP-consider cardiology if so.

## 2014-08-21 NOTE — Progress Notes (Addendum)
  Garret Reddish, MD Phone: [phone]  Subjective:   Nicholas Perry is a 64 y.o. year old very pleasant male patient who presents with the following:  Atrial fibrillation-recurrent for first time since 2911 Hypertension-mild poor control.  -a fib within 4 hours of cardura. Then at least every 3-4th night would get a fib while sitting down. At least 20 episodes. Goes to bed and resolved next morning. In house HR would get up to 80. At gym, got up to 180. Restarted baby aspirin 81mg  on his own. Patient has always been aware of episodes. He stopped his adderall which did not stop atrial fibrillation or improve his blood pressure.   Blood pressure was in 120-130 over 75-85 range typically. Has trended back up off cardura.   ROS-Once a night nocturia (does not want to try another BPH med currently), no chest pain, shortness of breath, blurry vision  Past Medical History- Patient Active Problem List   Diagnosis Date Noted  . 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 Seizure after head injury 12/14/2013    Priority: High  . BPH (benign prostatic hyperplasia) 07/18/2014    Priority: Medium  . Atrial fibrillation 09/22/2009    Priority: Medium  . Hyperlipidemia 01/02/2008    Priority: Medium  . Essential hypertension 01/02/2008    Priority: Medium   Medications- reviewed and updated Current Outpatient Prescriptions  Medication Sig Dispense Refill  . aspirin 81 MG tablet Take 81 mg by mouth daily.    Marland Kitchen doxazosin (CARDURA) 1 MG tablet Take 1 tablet (1 mg total) by mouth daily. (Patient not taking: Reported on 08/21/2014) 30 tablet 3   Objective: BP 130/92 mmHg  Pulse 60  Temp(Src) 98.2 F (36.8 C)  Wt 178 lb (80.74 kg) Gen: NAD, resting comfortably CV: RRR no murmurs rubs or gallops.HR low 60s, no irregular beats.  Lungs: CTAB no crackles, wheeze, rhonchi Ext: no edema Skin: warm, dry, no rash Neuro: stutters with speech per baseline (actually seems improved  from last visit)   Assessment/Plan:  Atrial fibrillation Cardura seemed to make patient go into atrial fibrillation and cause runny nose. A fib episodes stopped within 48 hours of stopping cardura. Patient self restarted aspirin thankfully and appropriate with chadsvasc score of 1. Hopeful no recurrence off medicine or on new lisinopril for BP-consider cardiology if so.    Essential hypertension Stop cardura (see a fib). Start lisinopril 5mg  with continued poor diastolic control. BMET 2 weeks. Patient to follow up at home with 3 month repeat in office.    Return precautions advised. 3 month in office, titration of lisinopril by phone or mychart.   Future bmet to evaluate creatinine on lisinopril.  Orders Placed This Encounter  Procedures  . Basic metabolic panel    [place]   Meds ordered this encounter  Medications  . aspirin 81 MG tablet    Sig: Take 81 mg by mouth daily.  Marland Kitchen lisinopril (PRINIVIL,ZESTRIL) 5 MG tablet    Sig: Take 1 tablet (5 mg total) by mouth daily.    Dispense:  30 tablet    Refill:  3

## 2014-08-23 ENCOUNTER — Encounter: Payer: Self-pay | Admitting: Neurology

## 2014-08-23 ENCOUNTER — Ambulatory Visit (INDEPENDENT_AMBULATORY_CARE_PROVIDER_SITE_OTHER): Payer: BLUE CROSS/BLUE SHIELD | Admitting: Neurology

## 2014-08-23 VITALS — BP 110/71 | HR 57 | Ht 73.0 in | Wt 177.4 lb

## 2014-08-23 DIAGNOSIS — F0781 Postconcussional syndrome: Secondary | ICD-10-CM
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
## 2014-08-23 NOTE — Patient Instructions (Signed)
Post-Concussion Syndrome Post-concussion syndrome describes the symptoms that can occur after a head injury. These symptoms can last from weeks to months. CAUSES  It is not clear why some head injuries cause post-concussion syndrome. It can occur whether your head injury was mild or severe and whether you were wearing head protection or not.  SIGNS AND SYMPTOMS  Memory difficulties.  Dizziness.  Headaches.  Double vision or blurry vision.  Sensitivity to light.  Hearing difficulties.  Depression.  Tiredness.  Weakness.  Difficulty with concentration.  Difficulty sleeping or staying asleep.  Vomiting.  Poor balance or instability on your feet.  Slow reaction time.  Difficulty learning and remembering things you have heard. DIAGNOSIS  There is no test to determine whether you have post-concussion syndrome. Your health care provider may order an imaging scan of your brain, such as a CT scan, to check for other problems that may be causing your symptoms (such as severe injury inside your skull). TREATMENT  Usually, these problems disappear over time without medical care. Your health care provider may prescribe medicine to help ease your symptoms. It is important to follow up with a neurologist to evaluate your recovery and address any lingering symptoms or issues. HOME CARE INSTRUCTIONS   Only take over-the-counter or prescription medicines for pain, discomfort, or fever as directed by your health care provider. Do not take aspirin. Aspirin can slow blood clotting.  Sleep with your head slightly elevated to help with headaches.  Avoid any situation where there is potential for another head injury (football, hockey, soccer, basketball, martial arts, downhill snow sports, and horseback riding). Your condition will get worse every time you experience a concussion. You should avoid these activities until you are evaluated by the appropriate follow-up health care  providers.  Keep all follow-up appointments as directed by your health care provider. SEEK IMMEDIATE MEDICAL CARE IF:  You develop confusion or unusual drowsiness.  You cannot wake the injured person.  You develop nausea or persistent, forceful vomiting.  You feel like you are moving when you are not (vertigo).  You notice the injured person's eyes moving rapidly back and forth. This may be a sign of vertigo.  You have convulsions or faint.  You have severe, persistent headaches that are not relieved by medicine.  You cannot use your arms or legs normally.  Your pupils change size.  You have clear or bloody discharge from the nose or ears.  Your problems are getting worse, not better. MAKE SURE YOU:  Understand these instructions.  Will watch your condition.  Will get help right away if you are not doing well or get worse. Document Released: 01/15/2002 Document Revised: 05/16/2013 Document Reviewed: 10/31/2013 Exit[employer] Patient Information 2015 [employer]. This information is not intended to replace advice given to you by your health care provider. Make sure you discuss any questions you have with your health care provider.  

## 2014-08-26 NOTE — Addendum Note (Signed)
Addended by: [name] on: 08/26/2014 12:26 PM   Modules accepted: Orders

## 2014-09-04 ENCOUNTER — Other Ambulatory Visit (INDEPENDENT_AMBULATORY_CARE_PROVIDER_SITE_OTHER): Payer: BLUE CROSS/BLUE SHIELD

## 2014-09-04 DIAGNOSIS — I10 Essential (primary) hypertension: Secondary | ICD-10-CM

## 2014-09-04 LAB — BASIC METABOLIC PANEL
BUN: 18 mg/dL (ref 6–23)
CALCIUM: 9.4 mg/dL (ref 8.4–10.5)
CHLORIDE: 102 meq/L (ref 96–112)
CO2: 31 mEq/L (ref 19–32)
Creatinine, Ser: 1.2 mg/dL (ref 0.40–1.50)
GFR: 65.15 mL/min (ref >60.00)
Glucose, Bld: 89 mg/dL (ref 70–99)
Potassium: 4.5 mEq/L (ref 3.5–5.1)
SODIUM: 138 meq/L (ref 135–145)

## 2014-09-24 ENCOUNTER — Encounter: Payer: Self-pay | Admitting: Internal Medicine

## 2014-09-24 LAB — HM COLONOSCOPY: HM Colonoscopy: NORMAL

## 2014-09-25 ENCOUNTER — Encounter: Payer: Self-pay | Admitting: Family Medicine

## 2014-10-18 ENCOUNTER — Other Ambulatory Visit: Payer: Self-pay

## 2014-10-18 MED ORDER — LISINOPRIL 5 MG PO TABS
5.0000 mg | ORAL_TABLET | Freq: Every day | ORAL | Status: DC
Start: 2014-10-18 — End: 2015-01-04

## 2014-11-07 LAB — HEPATIC FUNCTION PANEL
ALT: 30 U/L (ref 10–40)
AST: 33 U/L (ref 14–40)
Alkaline Phosphatase: 45 U/L (ref 25–125)

## 2014-11-07 LAB — CBC AND DIFFERENTIAL
HEMATOCRIT: 43 % (ref 41–53)
Hemoglobin: 14 g/dL (ref 13.5–17.5)
PLATELETS: 199 10*3/uL (ref 150–399)
WBC: 5 10*3/mL

## 2014-11-07 LAB — TSH: TSH: 0.7 u[IU]/mL (ref <=5.90)

## 2014-11-07 LAB — BASIC METABOLIC PANEL
BUN: 19 mg/dL (ref 4–21)
Creatinine: 1.2 mg/dL (ref <=1.3)
GLUCOSE: 89 mg/dL
Potassium: 4.2 mmol/L (ref 3.4–5.3)
SODIUM: 142 mmol/L (ref 137–147)

## 2014-11-07 LAB — LIPID PANEL
CHOLESTEROL: 258 mg/dL — AB (ref 0–200)
HDL: 123 mg/dL — AB (ref 35–70)
LDL Cholesterol: 124 mg/dL
TRIGLYCERIDES: 54 mg/dL (ref 40–160)

## 2014-11-07 LAB — PSA: PSA: 0.68

## 2014-11-20 ENCOUNTER — Encounter: Payer: Self-pay | Admitting: Family Medicine

## 2014-11-20 ENCOUNTER — Ambulatory Visit (INDEPENDENT_AMBULATORY_CARE_PROVIDER_SITE_OTHER): Payer: BLUE CROSS/BLUE SHIELD | Admitting: Family Medicine

## 2014-11-20 VITALS — BP 128/72 | HR 60 | Temp 98.7°F | Wt 167.0 lb

## 2014-11-20 DIAGNOSIS — R634 Abnormal weight loss: Secondary | ICD-10-CM

## 2014-11-20 DIAGNOSIS — I480 Paroxysmal atrial fibrillation: Secondary | ICD-10-CM | POA: Diagnosis not present

## 2014-11-20 DIAGNOSIS — I10 Essential (primary) hypertension: Secondary | ICD-10-CM | POA: Diagnosis not present

## 2014-11-20 NOTE — Progress Notes (Signed)
  Garret Reddish, MD Phone: [phone]  Subjective:   Nicholas Perry is a 64 y.o. year old very pleasant male patient who presents with the following:  Hypertension-controlled Atrial fibrillation-paroxysmal  BP Readings from Last 3 Encounters:  11/20/14 128/72  08/23/14 110/71  08/21/14 130/92   - 1 a fib episode while dehydrated at the gym. Gone after 4 hours.  Home BP monitoring-yes and in 120s over 70s for most part Compliant with medications-yes without side effects, lisinopril and asa ROS-Denies any CP, HA, SOB, blurry vision, LE edema. Did have some dizziness and shortness of breath with a fib episode which resolved afterwards  Past Medical History- Patient Active Problem List   Diagnosis Date Noted  . 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 Seizure after head injury 12/14/2013    Priority: High  . Atrial fibrillation 09/22/2009    Priority: High  . BPH (benign prostatic hyperplasia) 07/18/2014    Priority: Medium  . Hyperlipidemia 01/02/2008    Priority: Medium  . Essential hypertension 01/02/2008    Priority: Medium   Medications- reviewed and updated Current Outpatient Prescriptions  Medication Sig Dispense Refill  . aspirin 81 MG tablet Take 81 mg by mouth daily.    Marland Kitchen lisinopril (PRINIVIL,ZESTRIL) 5 MG tablet Take 1 tablet (5 mg total) by mouth daily. 90 tablet 0   Objective: BP 128/72 mmHg  Pulse 60  Temp(Src) 98.7 F (37.1 C)  Wt 167 lb (75.751 kg) Gen: NAD, resting comfortably CV: RRR no murmurs rubs or gallops Lungs: CTAB no crackles, wheeze, rhonchi Abdomen: soft/nontender/nondistended/normal bowel sounds.  Ext: no edema Skin: warm, dry, some looser skin, does seem to have less muscle mass Neuro: grossly normal, moves all extremities, slowed speech and occasional word finding  Assessment/Plan:  Essential hypertension Controlled on lisinopril. No changes.    Atrial fibrillation Paroxysmal. 1 episode in 3 months. On aspirin  with chadsvasc score of 2 (calculated as 1 last time for age only did not add HTN though). Will discuss potential anticoagulation with patient at next visit.    Weight loss S: 10 lbs in 3 months. Unintentional. Doctors day labs in this time were largely normal-see abstracted labs including normal thyroid. Up to date colon cancer screening. PSA low. Nonsmoker and no chest pain or shortness of breath to suggest lung cancer.  A/P: May be fluctuation due to better weather and potential increased activity, prior low weight 171 and 167 today. We discussed 2-3 month check in by mychart and if still losing weight may perform repeat labwork and further evaluation for weight loss.  Return precautions advised. 6 month in person planned.

## 2014-11-20 NOTE — Patient Instructions (Addendum)
Try to keep me updated over the next 2-3 months over your weight trend. If you are continuing to lose weight, may need to do more evaluation though bloodwork was pretty reassuring and you are up to date on cancer screening. Through [employer] or [employer].   Lisinopril seems to be a better fit and blood pressure looks great. Continue current dose.   See me in 6 months or sooner if needed

## 2014-11-20 NOTE — Assessment & Plan Note (Signed)
Controlled on lisinopril. No changes.

## 2014-11-20 NOTE — Assessment & Plan Note (Signed)
Paroxysmal. 1 episode in 3 months. On aspirin with chadsvasc score of 2 (calculated as 1 last time for age only did not add HTN though). Will discuss potential anticoagulation with patient at next visit.

## 2014-12-02 ENCOUNTER — Encounter: Payer: Self-pay | Admitting: Family Medicine

## 2014-12-09 ENCOUNTER — Encounter: Payer: Self-pay | Admitting: Psychology

## 2014-12-09 ENCOUNTER — Ambulatory Visit: Payer: BLUE CROSS/BLUE SHIELD | Attending: Psychology | Admitting: Psychology

## 2014-12-09 DIAGNOSIS — T754XXS Electrocution, sequela: Secondary | ICD-10-CM

## 2014-12-09 DIAGNOSIS — S069X0S Unspecified intracranial injury without loss of consciousness, sequela: Secondary | ICD-10-CM | POA: Diagnosis not present

## 2014-12-09 DIAGNOSIS — F068 Other specified mental disorders due to known physiological condition: Secondary | ICD-10-CM | POA: Diagnosis not present

## 2014-12-09 NOTE — Progress Notes (Addendum)
[facility]  [address]   Telephone [phone] [address] Fax [phone] [place], [place] [address]   [place] EVALUATION May 2016  *CONFIDENTIAL* This report should not be released without the consent of the client  Name:   Nicholas Perry  Date of Birth:  2051/02/17 [facility] MR#:  [id number] Date of Evaluation: 12/09/14  Background Nicholas Perry is a 64 year-old, right-handed man who was returns as planned for a repeat neuropsychological evaluation approximately one year after he suffered an electrocution injury and traumatic brain injury (TBI) in an unwitnessed fall off of a ladder on 12/14/13.   He previously underwent neuropsychological evaluation in August 2015 and November 2015. The initial neuropsychological evaluation identified mild impairment of executive and attentional processes, intermittent speech dysfluency, difficulty with complex word finding and slowed bimanual speed of manipulative dexterity. On a follow-up evaluation four months later he did not show measureable improvement on most measures of attentional and executive functioning. There was no report of affective distress or behavioral dyscontrol. 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 He was referred for cognitive rehabilitation therapy. He was eventually approved for long-term disability.   Review of medical records indicated that in the interval since he was last seen in November 2015, use of Adderall was discontinued in December 2015 due to an absence of noticeable effect. His primary care physician stopped Cardura and started him on lisinopril.  Report of Patient & Spouse Nicholas Perry continues to report distractibility, propensity to become mentally fatigued to cognitive challenge, a sense of slowed cognitive processing, difficulty deciding how to begin and plan a new task that involves multiple steps and intermittent dysfluent speech (especially when in groups). He did  not report any major changes in his health. He continues to experience daily headaches of mild intensity (usually triggered by noise or bright light), left shoulder pain, problems maintaining sleep at night and numbness in the tips of the fingers of both hands. He reported feeling somewhat anxious around others due to his speech dysfluency but otherwise denied symptoms of emotional distress or problems with behavioral control. (His score of 9 on the Patient Health Questionnaire specifically reflected his report of trouble concentrating, problems staying asleep and having little energy. He did not endorse any symptoms of affective distress).  He reported keeping busy at home, primarily by performing housekeeping and landscaping activities. He does not see many friends these days though goes golfing with a friend about every two weeks. He did not report feeling lonely. He reported that he resumed driving in November 4174 without incident.  He has been periodically working with a neuropsychologist over the past few months to improve his cognitive functioning and coping skills. He has also been working on Copywriter, advertising exercises since the beginning of 2016. He has been surprised at how low he has scored on the online cognitive tasks. 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   In a phone interview, his wife, Ms. Nicholas Perry, stated her belief that her husband's cognitive functioning has reached a plateau within the past few months. She described him as staying busy at home. Other than concern that he has been socially isolated since he no longer works, she did not report any worries about his mood or behavior.  Test Results Tests of attention, processing speed, eye-hand dexterity, cognitive flexibility, memory and problem-solving were administered or  re-administered. Evaluation procedures included: Animal  Naming Test Controlled Oral Word Association Test  Grooved Pegboard Patient Health Questionnaire  Ruff Figural Fluency Test Stroop Color Word Test  Trail Making A & B  Wechsler Adult Intelligence Scale- IV:  Coding & Digit Span Wechsler Memory Scale-IV: Logical Memory I & II; Visual Reproduction I & II    Wisconsin Card Sorting Test  The test results were considered to reflect a valid representation of his current cognitive functioning.  He appeared alert, focused and at ease. He seemed to expend maximal effort.  Other than stories used in a memory test, he reported no recollection of any testing stimuli or materials.   His test scores were corrected to reflect norms for his age and, whenever possible, his gender and educational level (i.e., 20+ years).   His raw test scores in parentheses with associated percentile ranks from the current and prior test administrations are listed below for comparison:  July/August 2015  November 2015       May 2016  Animal Naming Test (18)   21st  (15)   11th  (15)    11th     Controlled Oral Word Association Test (33)   11th   (33)   11th  (37)    21st     Grooved Pegboard- Right Hand (83s)  14th  (101s)   7th  (85s)   14th   Grooved Pegboard- Left Hand (113s)  7th  (129s)   3rd  (93s)   14th     Ruff Figural Fluency Test  -          - (87)     51st     Stroop Word (77)     1st  (70)    <1st  (82)      1st   Stroop Color (62)     5th  (30)    <1st  (58)      1st   Stroop Color-Word (18)   <1st  (15)    <1st  (20)    <1st     Trails A (41s 0e)   11th  (40s 0e)   11th  (43s 0e)   11th   Trails B (213s 1e)   1st  (165s 2e)   1st   (93s 1e)   14th     Wechsler Adult Intelligence Scale- IV Digit Span Forward  (9)     37th  (9)      37th  (11)    63rd  WAIS-IV Digit Span Backward (7)     25th  (11)    84th  (10)    75th   WAIS-IV Digit Span Sequencing (5)       9th  (9)      63rd  (10)    75th   WAIS-IV Coding (54)   37th  (45)    16th  (44)     16th     Wechsler Memory Scale-IV Logical Memory I (31)    75th  (52)    84th  (28)    63rd   WMS-IV Logical Memory II (17)    25th  (31)    91st  (57)    84th   WMS-IV Visual Reproduction I (41)    91st                  - (28)    63rd   WMS-IV Visual Reproduction II (37)  95th                  - (29)    84th     [employer] Errors (14)        58th                   - (15)        55th   WCST Perseverative Responses (12)        42nd                   - (10)        47th    WCST Categories (6)        >16th                   - (6)        >16th   WCST Trials to 1st Category  (11)      >16th                   - (11)      >16th   WCST Failure to Maintain Set (0)        >16th                   - (0)        >16th    WCST Learning to Learn (-1.52)  >16th                   - (-2.89)  >16th    As shown above, he continues to demonstrate persisting deficits in the areas of mental processing speed, complex attention and cognitive flexibility. These abilities are likely to decline further when he becomes fatigued and/or is in a distracting environment. Memory and concept formation continue to be well-preserved.   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  2. Although his memory functioning was intact on neuropsychological testing, he was advised to continue using compensatory strategies to aid memory (e.g., asking for repetition, using reminders, writing information in a notebook) as his registration memory is susceptible to being disrupted by external distraction and his level of fatigue.  3. He was encouraged to maintain involvement in mentally, physically and socially stimulating activities to the limits of his mental and physical endurance. He was encouraged to resume reading for pleasure again, for a few  minutes at a time to start until he gets a sense of his ability to sustain attention to this activity.  4. He stated that it is important to him that his cognitive recovery be closely tracked. Therefore, a neuropsychological re-assessment can be considered in one year to eighteen months from now. At that point, he will have likely reached maximal neuropsychological improvement.   The conclusions and recommendations from this evaluation were discussed with him on 12/09/14.    I have appreciated the opportunity to again evaluate Nicholas Perry.  Please feel free to contact me with any comments or questions.    __________________ Antionette Poles, Ph.D Licensed Psychologist

## 2015-01-04 ENCOUNTER — Encounter: Payer: Self-pay | Admitting: Family Medicine

## 2015-01-04 ENCOUNTER — Other Ambulatory Visit: Payer: Self-pay | Admitting: Family Medicine

## 2015-01-04 ENCOUNTER — Other Ambulatory Visit: Payer: Self-pay

## 2015-01-04 MED ORDER — LISINOPRIL 5 MG PO TABS: 5.0000 mg | ORAL_TABLET | Freq: Every day | ORAL | Status: DC

## 2015-01-22 ENCOUNTER — Telehealth: Payer: Self-pay | Admitting: *Deleted

## 2015-01-22 NOTE — Telephone Encounter (Signed)
Form,[employer] sent to [facility] and Dr Jannifer Franklin 01/22/15.

## 2015-01-23 ENCOUNTER — Telehealth: Payer: Self-pay | Admitting: *Deleted

## 2015-01-23 DIAGNOSIS — Z0289 Encounter for other administrative examinations: Secondary | ICD-10-CM

## 2015-01-23 NOTE — Telephone Encounter (Signed)
Form completed, signed and returned to Donna. 

## 2015-01-23 NOTE — Telephone Encounter (Signed)
Form,[employer] received,completed by Dr Jannifer Franklin and Charisse Klinefelter at front desk for patient 01/23/15.

## 2015-02-12 ENCOUNTER — Telehealth: Payer: Self-pay | Admitting: *Deleted

## 2015-02-12 NOTE — Telephone Encounter (Signed)
Form,[employer] sent to [place] and Dr Jannifer Franklin 02/12/15.

## 2015-02-13 ENCOUNTER — Telehealth: Payer: Self-pay | Admitting: *Deleted

## 2015-02-13 DIAGNOSIS — Z0289 Encounter for other administrative examinations: Secondary | ICD-10-CM

## 2015-02-13 NOTE — Telephone Encounter (Signed)
Form completed, signed and returned to Donna. 

## 2015-02-13 NOTE — Telephone Encounter (Signed)
Form,[employer] received,completed by Dr Jannifer Franklin and Charisse Klinefelter at front desk for patient 02/13/15.

## 2015-04-29 ENCOUNTER — Encounter: Payer: Self-pay | Admitting: Neurology

## 2015-04-29 ENCOUNTER — Ambulatory Visit (INDEPENDENT_AMBULATORY_CARE_PROVIDER_SITE_OTHER): Payer: BLUE CROSS/BLUE SHIELD | Admitting: Neurology

## 2015-04-29 VITALS — BP 122/78 | HR 51 | Ht 73.0 in | Wt 170.0 lb

## 2015-04-29 DIAGNOSIS — F0781 Postconcussional syndrome: Secondary | ICD-10-CM

## 2015-04-29 NOTE — Progress Notes (Signed)
Reason for visit: Post concussive syndrome  Nicholas Perry is an 64 y.o. 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 The patient has sustained permanent cognitive issues, difficulty with a stuttering speech pattern, difficulty with cognitive processing, and math function. The patient operates a motor vehicle, and he does well with this. He has reported some ongoing virtually daily headaches, but rarely needs to take evening for them. He reports some balance issues as well. He has not had any recent falls. He does play golf on occasion and seems to do well with this. He was seen by Dr. Valentina Shaggy in May 2016, he was felt to be stable with his cognitive issues. He returns for an evaluation. He is on permanent disability at this time.  Past Medical History  Diagnosis Date  . Seizure after head injury 12/14/2013  . 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 Paroxysmal atrial fibrillation     Past Surgical History  Procedure Laterality Date  . Knee scopes Bilateral   . Nasal septum surgery    . Right inguinal hernia      child  . Tonsilectomy/adenoidectomy with myringotomy      child    Family History  Problem Relation Age of Onset  . Hypertension Brother     smoker  . CAD Brother     stent, smoker  . Cancer - Other Father     renal cell metastatic    Social history:  reports that he has never smoked. He has never used smokeless tobacco. He reports that he drinks about 4.2 - 8.4 oz of alcohol per week. He reports that he does not use illicit drugs.    Allergies  Allergen Reactions  . Cardura [Doxazosin Mesylate]     Caused patient to go into a. Fib.  . Simvastatin     REACTION: weight loss, weakness    Medications:  Prior to Admission medications   Medication Sig Start Date End Date Taking? Authorizing Mitesh Rosendahl  aspirin 81 MG tablet Take 81 mg by mouth daily.   Yes Historical Nahome Bublitz, MD  lisinopril  (PRINIVIL,ZESTRIL) 5 MG tablet Take 1 tablet (5 mg total) by mouth daily. 01/04/15  Yes [name], MD    ROS:  Out of a complete 14 system review of symptoms, the patient complains only of the following symptoms, and all other reviewed systems are negative.  Ringing in the ears Light sensitivity Frequent waking, daytime sleepiness Memory loss, headache, numbness, speech difficulty Decreased concentration  Blood pressure 122/78, pulse 51, height 6\' 1"  (1.854 m), weight 170 lb (77.111 kg).  Physical Exam  General: The patient is alert and cooperative at the time of the examination.  Skin: No significant peripheral edema is noted.   Neurologic Exam  Mental status: The patient is alert and oriented x 3 at the time of the examination. The patient has apparent normal recent and remote memory, with an apparently normal attention span and concentration ability.   Cranial nerves: Facial symmetry is present. Speech is normal, no aphasia or dysarthria is noted. Extraocular movements are full. Visual fields are full.  Motor: The patient has good strength in all 4 extremities.  Sensory examination: Soft touch sensation is symmetric on the face, arms, and legs.  Coordination: The patient has good finger-nose-finger and heel-to-shin bilaterally.  Gait and station: The patient has a normal gait. Tandem gait is normal. Romberg is negative. No drift is  seen.  Reflexes: Deep tendon reflexes are symmetric.   Assessment/Plan:  1. Post concussive syndrome  The patient continues to have ongoing cognitive issues. He likely will not improve to baseline at this point. He will be followed on an annual basis, he is on no medications at this time.  [name] 04/29/2015 8:16 PM  [facility] [address] [place], [place] [address]  Phone [phone] Fax [phone]

## 2015-04-29 NOTE — Patient Instructions (Signed)
Post-Concussion Syndrome Post-concussion syndrome describes the symptoms that can occur after a head injury. These symptoms can last from weeks to months. CAUSES  It is not clear why some head injuries cause post-concussion syndrome. It can occur whether your head injury was mild or severe and whether you were wearing head protection or not.  SIGNS AND SYMPTOMS  Memory difficulties.  Dizziness.  Headaches.  Double vision or blurry vision.  Sensitivity to light.  Hearing difficulties.  Depression.  Tiredness.  Weakness.  Difficulty with concentration.  Difficulty sleeping or staying asleep.  Vomiting.  Poor balance or instability on your feet.  Slow reaction time.  Difficulty learning and remembering things you have heard. DIAGNOSIS  There is no test to determine whether you have post-concussion syndrome. Your health care provider may order an imaging scan of your brain, such as a CT scan, to check for other problems that may be causing your symptoms (such as severe injury inside your skull). TREATMENT  Usually, these problems disappear over time without medical care. Your health care provider may prescribe medicine to help ease your symptoms. It is important to follow up with a neurologist to evaluate your recovery and address any lingering symptoms or issues. HOME CARE INSTRUCTIONS   Only take over-the-counter or prescription medicines for pain, discomfort, or fever as directed by your health care provider. Do not take aspirin. Aspirin can slow blood clotting.  Sleep with your head slightly elevated to help with headaches.  Avoid any situation where there is potential for another head injury (football, hockey, soccer, basketball, martial arts, downhill snow sports, and horseback riding). Your condition will get worse every time you experience a concussion. You should avoid these activities until you are evaluated by the appropriate follow-up health care  providers.  Keep all follow-up appointments as directed by your health care provider. SEEK IMMEDIATE MEDICAL CARE IF:  You develop confusion or unusual drowsiness.  You cannot wake the injured person.  You develop nausea or persistent, forceful vomiting.  You feel like you are moving when you are not (vertigo).  You notice the injured person's eyes moving rapidly back and forth. This may be a sign of vertigo.  You have convulsions or faint.  You have severe, persistent headaches that are not relieved by medicine.  You cannot use your arms or legs normally.  Your pupils change size.  You have clear or bloody discharge from the nose or ears.  Your problems are getting worse, not better. MAKE SURE YOU:  Understand these instructions.  Will watch your condition.  Will get help right away if you are not doing well or get worse. Document Released: 01/15/2002 Document Revised: 05/16/2013 Document Reviewed: 10/31/2013 Exit[employer] Patient Information 2015 [employer]. This information is not intended to replace advice given to you by your health care provider. Make sure you discuss any questions you have with your health care provider.  

## 2015-05-29 ENCOUNTER — Other Ambulatory Visit: Payer: Self-pay | Admitting: Orthopedic Surgery

## 2015-06-02 ENCOUNTER — Other Ambulatory Visit: Payer: Self-pay | Admitting: Family Medicine

## 2015-06-02 ENCOUNTER — Ambulatory Visit (INDEPENDENT_AMBULATORY_CARE_PROVIDER_SITE_OTHER): Payer: BLUE CROSS/BLUE SHIELD | Admitting: Family Medicine

## 2015-06-02 ENCOUNTER — Encounter: Payer: Self-pay | Admitting: Family Medicine

## 2015-06-02 VITALS — BP 138/70 | HR 71 | Temp 98.0°F | Wt 174.0 lb

## 2015-06-02 DIAGNOSIS — F0781 Postconcussional syndrome: Secondary | ICD-10-CM

## 2015-06-02 DIAGNOSIS — I480 Paroxysmal atrial fibrillation: Secondary | ICD-10-CM | POA: Diagnosis not present

## 2015-06-02 DIAGNOSIS — I10 Essential (primary) hypertension: Secondary | ICD-10-CM

## 2015-06-02 DIAGNOSIS — Z20828 Contact with and (suspected) exposure to other viral communicable diseases: Secondary | ICD-10-CM | POA: Diagnosis not present

## 2015-06-02 MED ORDER — LISINOPRIL 5 MG PO TABS: 5.0000 mg | ORAL_TABLET | Freq: Every day | ORAL | Status: DC

## 2015-06-02 NOTE — Patient Instructions (Signed)
Hepatitis C testing before you go  Blood pressure is controlled at home and here- no changes  Continue aspirin  Thanks for getting your flu shot  See you back in 6 months unless you need [place] sooner

## 2015-06-02 NOTE — Progress Notes (Signed)
Pre visit review using our clinic review tool, if applicable. No additional management support is needed unless otherwise documented below in the visit note. 

## 2015-06-02 NOTE — Progress Notes (Signed)
Garret Reddish, MD  Subjective:  Nicholas Perry is a 64 y.o. year old very pleasant male patient who presents for/with See problem oriented charting ROS- no chest pain or shortness of breath, no palpitations.   Past Medical History-  Patient Active Problem List   Diagnosis Date Noted  . 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 Seizure after head injury ([place]) 12/14/2013    Priority: High  . Atrial fibrillation ([place]) 09/22/2009    Priority: High  . BPH (benign prostatic hyperplasia) 07/18/2014    Priority: Medium  . Hyperlipidemia 01/02/2008    Priority: Medium  . Essential hypertension 01/02/2008    Priority: Medium    Medications- reviewed and updated Current Outpatient Prescriptions  Medication Sig Dispense Refill  . aspirin 81 MG tablet Take 81 mg by mouth daily.    Marland Kitchen lisinopril (PRINIVIL,ZESTRIL) 5 MG tablet Take 1 tablet (5 mg total) by mouth daily. 90 tablet 3   Objective: BP 138/70 mmHg  Pulse 71  Temp(Src) 98 F (36.7 C) (Oral)  Wt 174 lb (78.926 kg)  SpO2 98% Gen: NAD, resting comfortably CV: RRR no murmurs rubs or gallops Lungs: CTAB no crackles, wheeze, rhonchi Abdomen: soft/nontender/nondistended/normal bowel sounds. No rebound or guarding.  Ext: no edema Skin: warm, dry Neuro:  moves all extremities. Stutters at times. Some difficulty finding words.   Assessment/Plan:  Post concussive encephalopathy S: Has had follow up with Dr. Valentina Shaggy in May. Patient feels his memory is doing some better and Dr. Valentina Shaggy did say memory functioning was intact. Plan to do 1 more check in 1 year to 18 months to see if any more improvement. If not, that would likely be point of max improvement. Saw Dr. 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  A/P: We had a long conversation today about how patient is doing with these facts and coping. Counseling provided. Overall he is doing reasonably well. States did deal with  depression at some point bu tthat has now resolved. He feels like he is improving both in his function and in his handling of his function.    Essential hypertension S: controlled. On lisinopril 5mg . Home checks near 120/70 BP Readings from Last 3 Encounters:  06/02/15 138/70  04/29/15 122/78  11/20/14 128/72  A/P:Continue current meds    Atrial fibrillation S: No more episodes since being off cardura. chadsvasc 1 for hypertension. <65 so age does not give point, unclear why I wrote that score 2as 2 last visit. Taking aspirin A/P: continue current aspirin. We discussed considering coumadin if has any more recurrence between now and when he turns 66 as chadsvasc would be 2   6 months planned  Patient able to complete 4 mets at gym without chest pain or shortness of breath. Would state medically maximized for surgery if received form for upcoming L shoulder surgery  Orders Placed This Encounter  Procedures  . Hepatitis C antibody, reflex    [facility]    Meds ordered this encounter  Medications  . lisinopril (PRINIVIL,ZESTRIL) 5 MG tablet    Sig: Take 1 tablet (5 mg total) by mouth daily.    Dispense:  90 tablet    Refill:  3    Weight loss issue has resolved Wt Readings from Last 3 Encounters:  06/02/15 174 lb (78.926 kg)  04/29/15 170 lb (77.111 kg)  11/20/14 167 lb (75.751 kg)   Health Maintenance Due  Topic Date Due  . Hepatitis C Screening -  opts in 1951-07-14  . HIV Screening - had at work 05/28/1967  . INFLUENZA VACCINE - already received 04/24/15 through [facility]- to be abstracted 03/10/2015

## 2015-06-02 NOTE — Assessment & Plan Note (Signed)
S: No more episodes since being off cardura. chadsvasc 1 for hypertension. <65 so age does not give point, unclear why I wrote that score 2as 2 last visit. Taking aspirin A/P: continue current aspirin. We discussed considering coumadin if has any more recurrence between now and when he turns 66 as chadsvasc would be 2

## 2015-06-02 NOTE — Assessment & Plan Note (Signed)
S: controlled. On lisinopril 5mg . Home checks near 120/70 BP Readings from Last 3 Encounters:  06/02/15 138/70  04/29/15 122/78  11/20/14 128/72  A/P:Continue current meds

## 2015-06-02 NOTE — Assessment & Plan Note (Signed)
S: Has had follow up with Dr. Valentina Shaggy in May. Patient feels his memory is doing some better and Dr. Valentina Shaggy did say memory functioning was intact. Plan to do 1 more check in 1 year to 18 months to see if any more improvement. If not, that would likely be point of max improvement. Saw Dr. 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  A/P: We had a long conversation today about how patient is doing with these facts and coping. Counseling provided. Overall he is doing reasonably well. States did deal with depression at some point bu tthat has now resolved. He feels like he is improving both in his function and in his handling of his function.

## 2015-06-03 LAB — HEPATITIS C ANTIBODY: HCV Ab: NEGATIVE

## 2015-06-24 ENCOUNTER — Encounter (HOSPITAL_BASED_OUTPATIENT_CLINIC_OR_DEPARTMENT_OTHER): Payer: Self-pay | Admitting: *Deleted

## 2015-06-27 ENCOUNTER — Encounter (HOSPITAL_BASED_OUTPATIENT_CLINIC_OR_DEPARTMENT_OTHER): Payer: Self-pay | Admitting: Anesthesiology

## 2015-06-27 ENCOUNTER — Ambulatory Visit (HOSPITAL_BASED_OUTPATIENT_CLINIC_OR_DEPARTMENT_OTHER): Payer: BLUE CROSS/BLUE SHIELD | Admitting: Anesthesiology

## 2015-06-27 ENCOUNTER — Encounter (HOSPITAL_BASED_OUTPATIENT_CLINIC_OR_DEPARTMENT_OTHER)
Admission: RE | Disposition: A | Discharge status: Home / Self Care | Payer: Self-pay | Source: Ambulatory Visit | Attending: Orthopedic Surgery

## 2015-06-27 ENCOUNTER — Ambulatory Visit (HOSPITAL_BASED_OUTPATIENT_CLINIC_OR_DEPARTMENT_OTHER)
Admission: RE | Admit: 2015-06-27 | Discharge: 2015-06-27 | Disposition: A | Discharge status: Home / Self Care | Payer: BLUE CROSS/BLUE SHIELD | Source: Ambulatory Visit | Attending: Orthopedic Surgery | Admitting: Orthopedic Surgery

## 2015-06-27 ENCOUNTER — Other Ambulatory Visit: Payer: Self-pay | Admitting: Family Medicine

## 2015-06-27 DIAGNOSIS — Z888 Allergy status to other drugs, medicaments and biological substances status: Secondary | ICD-10-CM | POA: Insufficient documentation

## 2015-06-27 DIAGNOSIS — I499 Cardiac arrhythmia, unspecified: Secondary | ICD-10-CM | POA: Diagnosis not present

## 2015-06-27 DIAGNOSIS — M7542 Impingement syndrome of left shoulder: Secondary | ICD-10-CM | POA: Diagnosis present

## 2015-06-27 DIAGNOSIS — Z7982 Long term (current) use of aspirin: Secondary | ICD-10-CM | POA: Diagnosis not present

## 2015-06-27 DIAGNOSIS — M75110 Incomplete rotator cuff tear or rupture of unspecified shoulder, not specified as traumatic: Secondary | ICD-10-CM | POA: Diagnosis present

## 2015-06-27 DIAGNOSIS — Z79899 Other long term (current) drug therapy: Secondary | ICD-10-CM | POA: Insufficient documentation

## 2015-06-27 DIAGNOSIS — I10 Essential (primary) hypertension: Secondary | ICD-10-CM | POA: Diagnosis not present

## 2015-06-27 DIAGNOSIS — I480 Paroxysmal atrial fibrillation: Secondary | ICD-10-CM | POA: Diagnosis not present

## 2015-06-27 DIAGNOSIS — M24112 Other articular cartilage disorders, left shoulder: Secondary | ICD-10-CM | POA: Insufficient documentation

## 2015-06-27 DIAGNOSIS — M75112 Incomplete rotator cuff tear or rupture of left shoulder, not specified as traumatic: Secondary | ICD-10-CM | POA: Insufficient documentation

## 2015-06-27 HISTORY — DX: Incomplete rotator cuff tear or rupture of unspecified shoulder, not specified as traumatic: M75.110

## 2015-06-27 HISTORY — PX: SHOULDER ACROMIOPLASTY: SHX6093

## 2015-06-27 HISTORY — PX: SHOULDER ARTHROSCOPY WITH ROTATOR CUFF REPAIR: SHX5685

## 2015-06-27 HISTORY — DX: Essential (primary) hypertension: I10

## 2015-06-27 HISTORY — DX: Pain in left shoulder: M25.512

## 2015-06-27 LAB — POCT I-STAT, CHEM 8
BUN: 24 mg/dL — ABNORMAL HIGH (ref 6–20)
CALCIUM ION: 1.29 mmol/L (ref 1.13–1.30)
CHLORIDE: 103 mmol/L (ref 101–111)
Creatinine, Ser: 1.2 mg/dL (ref 0.61–1.24)
GLUCOSE: 88 mg/dL (ref 65–99)
HCT: 50 % (ref 39.0–52.0)
HEMOGLOBIN: 17 g/dL (ref 13.0–17.0)
Potassium: 4 mmol/L (ref 3.5–5.1)
SODIUM: 141 mmol/L (ref 135–145)
TCO2: 27 mmol/L (ref 0–100)

## 2015-06-27 SURGERY — ARTHROSCOPY, SHOULDER, WITH ROTATOR CUFF REPAIR
Anesthesia: Regional | Site: Shoulder | Laterality: Left

## 2015-06-27 MED ORDER — GLYCOPYRROLATE 0.2 MG/ML IJ SOLN: 0.2000 mg | Freq: Once | INTRAMUSCULAR | Status: DC | PRN

## 2015-06-27 MED ORDER — PHENYLEPHRINE HCL 10 MG/ML IJ SOLN
10.0000 mg | INTRAMUSCULAR | Status: DC | PRN
  Administered 2015-06-27: 50 ug/min via INTRAVENOUS

## 2015-06-27 MED ORDER — ONDANSETRON HCL 4 MG/2ML IJ SOLN
INTRAMUSCULAR | Status: AC
  Filled 2015-06-27: qty 2

## 2015-06-27 MED ORDER — METOPROLOL TARTRATE 1 MG/ML IV SOLN
INTRAVENOUS | Status: AC
  Filled 2015-06-27: qty 5

## 2015-06-27 MED ORDER — BACLOFEN 10 MG PO TABS: 10.0000 mg | ORAL_TABLET | Freq: Three times a day (TID) | ORAL | Status: DC

## 2015-06-27 MED ORDER — CEFAZOLIN SODIUM-DEXTROSE 2-3 GM-% IV SOLR
2.0000 g | INTRAVENOUS | Status: AC
  Administered 2015-06-27: 2 g via INTRAVENOUS

## 2015-06-27 MED ORDER — PROPOFOL 500 MG/50ML IV EMUL
INTRAVENOUS | Status: DC | PRN
  Administered 2015-06-27: 200 ug/kg/min via INTRAVENOUS

## 2015-06-27 MED ORDER — OXYCODONE-ACETAMINOPHEN 5-325 MG PO TABS: 1.0000 | ORAL_TABLET | Freq: Four times a day (QID) | ORAL | Status: DC | PRN

## 2015-06-27 MED ORDER — ACETAMINOPHEN 325 MG PO TABS
ORAL_TABLET | ORAL | Status: AC
  Filled 2015-06-27: qty 2

## 2015-06-27 MED ORDER — FENTANYL CITRATE (PF) 100 MCG/2ML IJ SOLN
50.0000 ug | INTRAMUSCULAR | Status: DC | PRN
  Administered 2015-06-27: 50 ug via INTRAVENOUS
  Administered 2015-06-27: 100 ug via INTRAVENOUS

## 2015-06-27 MED ORDER — LACTATED RINGERS IV SOLN
INTRAVENOUS | Status: DC
  Administered 2015-06-27: 07:00:00 via INTRAVENOUS
  Administered 2015-06-27: 10 mL/h via INTRAVENOUS

## 2015-06-27 MED ORDER — SUCCINYLCHOLINE CHLORIDE 20 MG/ML IJ SOLN
INTRAMUSCULAR | Status: DC | PRN
Start: 2015-06-27 — End: 2015-06-27
  Administered 2015-06-27: 100 mg via INTRAVENOUS

## 2015-06-27 MED ORDER — DEXAMETHASONE SODIUM PHOSPHATE 4 MG/ML IJ SOLN
INTRAMUSCULAR | Status: DC | PRN
  Administered 2015-06-27: 10 mg via INTRAVENOUS

## 2015-06-27 MED ORDER — FENTANYL CITRATE (PF) 100 MCG/2ML IJ SOLN: 25.0000 ug | INTRAMUSCULAR | Status: DC | PRN

## 2015-06-27 MED ORDER — FENTANYL CITRATE (PF) 100 MCG/2ML IJ SOLN
INTRAMUSCULAR | Status: AC
  Filled 2015-06-27: qty 2

## 2015-06-27 MED ORDER — LIDOCAINE HCL (CARDIAC) 20 MG/ML IV SOLN
INTRAVENOUS | Status: DC | PRN
  Administered 2015-06-27: 50 mg via INTRAVENOUS

## 2015-06-27 MED ORDER — MIDAZOLAM HCL 2 MG/2ML IJ SOLN
1.0000 mg | INTRAMUSCULAR | Status: DC | PRN
  Administered 2015-06-27: 1 mg via INTRAVENOUS

## 2015-06-27 MED ORDER — MIDAZOLAM HCL 2 MG/2ML IJ SOLN
INTRAMUSCULAR | Status: AC
  Filled 2015-06-27: qty 2

## 2015-06-27 MED ORDER — PROPOFOL 500 MG/50ML IV EMUL
INTRAVENOUS | Status: AC
  Filled 2015-06-27: qty 50

## 2015-06-27 MED ORDER — PROPOFOL 10 MG/ML IV BOLUS
INTRAVENOUS | Status: DC | PRN
  Administered 2015-06-27: 150 mg via INTRAVENOUS

## 2015-06-27 MED ORDER — ONDANSETRON HCL 4 MG/2ML IJ SOLN
INTRAMUSCULAR | Status: DC | PRN
  Administered 2015-06-27: 4 mg via INTRAVENOUS

## 2015-06-27 MED ORDER — CEFAZOLIN SODIUM-DEXTROSE 2-3 GM-% IV SOLR
INTRAVENOUS | Status: AC
  Filled 2015-06-27: qty 50

## 2015-06-27 MED ORDER — SCOPOLAMINE 1 MG/3DAYS TD PT72: 1.0000 | MEDICATED_PATCH | Freq: Once | TRANSDERMAL | Status: DC | PRN

## 2015-06-27 MED ORDER — SUCCINYLCHOLINE CHLORIDE 20 MG/ML IJ SOLN
INTRAMUSCULAR | Status: AC
  Filled 2015-06-27: qty 1

## 2015-06-27 MED ORDER — LIDOCAINE HCL (CARDIAC) 20 MG/ML IV SOLN
INTRAVENOUS | Status: AC
  Filled 2015-06-27: qty 5

## 2015-06-27 MED ORDER — SENNA-DOCUSATE SODIUM 8.6-50 MG PO TABS: 2.0000 | ORAL_TABLET | Freq: Every day | ORAL | Status: DC

## 2015-06-27 MED ORDER — ONDANSETRON HCL 4 MG PO TABS: 4.0000 mg | ORAL_TABLET | Freq: Three times a day (TID) | ORAL | Status: DC | PRN

## 2015-06-27 MED ORDER — METOPROLOL TARTRATE 1 MG/ML IV SOLN
INTRAVENOUS | Status: DC | PRN
  Administered 2015-06-27: .5 mg via INTRAVENOUS
  Administered 2015-06-27: 1 mg via INTRAVENOUS
  Administered 2015-06-27 (×2): .5 mg via INTRAVENOUS

## 2015-06-27 MED ORDER — BUPIVACAINE HCL (PF) 0.5 % IJ SOLN
INTRAMUSCULAR | Status: DC | PRN
  Administered 2015-06-27: 30 mL via PERINEURAL

## 2015-06-27 MED ORDER — DEXAMETHASONE SODIUM PHOSPHATE 10 MG/ML IJ SOLN
INTRAMUSCULAR | Status: AC
  Filled 2015-06-27: qty 1

## 2015-06-27 MED ORDER — ACETAMINOPHEN 325 MG PO TABS
650.0000 mg | ORAL_TABLET | Freq: Once | ORAL | Status: AC
  Administered 2015-06-27: 325 mg via ORAL

## 2015-06-27 SURGICAL SUPPLY — 63 items
ANCHOR SUT BIO SW 4.75X19.1 (Anchor) ×3 IMPLANT
BLADE CUTTER GATOR 3.5 (BLADE) ×3 IMPLANT
BLADE GREAT WHITE 4.2 (BLADE) IMPLANT
BLADE SURG 15 STRL LF DISP TIS (BLADE) IMPLANT
BLADE SURG 15 STRL SS (BLADE)
BUR OVAL 6.0 (BURR) IMPLANT
CANNULA 5.75X71 LONG (CANNULA) ×3 IMPLANT
CANNULA TWIST IN 8.25X7CM (CANNULA) IMPLANT
CANNULA TWIST IN 8.25X9CM (CANNULA) ×3 IMPLANT
CLSR STERI-STRIP ANTIMIC 1/2X4 (GAUZE/BANDAGES/DRESSINGS) ×3 IMPLANT
DECANTER SPIKE VIAL GLASS SM (MISCELLANEOUS) IMPLANT
DRAPE INCISE IOBAN 66X45 STRL (DRAPES) ×3 IMPLANT
DRAPE SHOULDER BEACH CHAIR (DRAPES) ×3 IMPLANT
DRAPE U 20/CS (DRAPES) ×3 IMPLANT
DRAPE U-SHAPE 47X51 STRL (DRAPES) ×3 IMPLANT
DRSG PAD ABDOMINAL 8X10 ST (GAUZE/BANDAGES/DRESSINGS) ×3 IMPLANT
DURAPREP 26ML APPLICATOR (WOUND CARE) ×3 IMPLANT
ELECT REM PT RETURN 9FT ADLT (ELECTROSURGICAL)
ELECTRODE REM PT RTRN 9FT ADLT (ELECTROSURGICAL) IMPLANT
FIBERSTICK 2 (SUTURE) IMPLANT
GAUZE SPONGE 4X4 12PLY STRL (GAUZE/BANDAGES/DRESSINGS) ×3 IMPLANT
GLOVE BIO SURGEON STRL SZ 6.5 (GLOVE) ×3 IMPLANT
GLOVE BIO SURGEON STRL SZ8 (GLOVE) ×3 IMPLANT
GLOVE BIOGEL PI IND STRL 7.0 (GLOVE) ×2 IMPLANT
GLOVE BIOGEL PI IND STRL 8 (GLOVE) ×4 IMPLANT
GLOVE BIOGEL PI INDICATOR 7.0 (GLOVE) ×1
GLOVE BIOGEL PI INDICATOR 8 (GLOVE) ×2
GLOVE ORTHO TXT STRL SZ7.5 (GLOVE) ×3 IMPLANT
GOWN STRL REUS W/ TWL LRG LVL3 (GOWN DISPOSABLE) ×2 IMPLANT
GOWN STRL REUS W/ TWL XL LVL3 (GOWN DISPOSABLE) ×4 IMPLANT
GOWN STRL REUS W/TWL LRG LVL3 (GOWN DISPOSABLE) ×1
GOWN STRL REUS W/TWL XL LVL3 (GOWN DISPOSABLE) ×2
IMMOBILIZER SHOULDER FOAM XLGE (SOFTGOODS) IMPLANT
IV NS IRRIG 3000ML ARTHROMATIC (IV SOLUTION) ×9 IMPLANT
KIT PUSHLOCK 2.9 HIP (KITS) IMPLANT
KIT SHOULDER TRACTION (DRAPES) ×3 IMPLANT
LASSO 90 CVE QUICKPAS (DISPOSABLE) ×3 IMPLANT
MANIFOLD NEPTUNE II (INSTRUMENTS) ×3 IMPLANT
PACK ARTHROSCOPY DSU (CUSTOM PROCEDURE TRAY) ×3 IMPLANT
PACK BASIN DAY SURGERY FS (CUSTOM PROCEDURE TRAY) ×3 IMPLANT
SET ARTHROSCOPY TUBING (MISCELLANEOUS) ×1
SET ARTHROSCOPY TUBING LN (MISCELLANEOUS) ×2 IMPLANT
SHEET MEDIUM DRAPE 40X70 STRL (DRAPES) ×3 IMPLANT
SLEEVE SCD COMPRESS KNEE MED (MISCELLANEOUS) ×3 IMPLANT
SLING ARM IMMOBILIZER LRG (SOFTGOODS) IMPLANT
SLING ARM IMMOBILIZER MED (SOFTGOODS) IMPLANT
SLING ARM LRG ADULT FOAM STRAP (SOFTGOODS) IMPLANT
SLING ARM MED ADULT FOAM STRAP (SOFTGOODS) IMPLANT
SLING ARM XL FOAM STRAP (SOFTGOODS) IMPLANT
SUT FIBERWIRE #2 38 T-5 BLUE (SUTURE)
SUT MNCRL AB 4-0 PS2 18 (SUTURE) IMPLANT
SUT PDS AB 1 CT  36 (SUTURE)
SUT PDS AB 1 CT 36 (SUTURE) IMPLANT
SUT TIGER TAPE 7 IN WHITE (SUTURE) IMPLANT
SUT VIC AB 3-0 SH 27 (SUTURE)
SUT VIC AB 3-0 SH 27X BRD (SUTURE) IMPLANT
SUTURE FIBERWR #2 38 T-5 BLUE (SUTURE) IMPLANT
TAPE FIBER 2MM 7IN #2 BLUE (SUTURE) IMPLANT
TAPE LABRALWHITE 1.5X36 (TAPE) IMPLANT
TAPE SUT LABRALTAP WHT/BLK (SUTURE) IMPLANT
TOWEL OR 17X24 6PK STRL BLUE (TOWEL DISPOSABLE) ×3 IMPLANT
TOWEL OR NON WOVEN STRL DISP B (DISPOSABLE) ×6 IMPLANT
WATER STERILE IRR 1000ML POUR (IV SOLUTION) ×3 IMPLANT

## 2015-06-27 NOTE — H&P (Signed)
PREOPERATIVE H&P  Chief Complaint: Left shoulder pain  HPI: Nicholas Perry is a 64 y.o. male who presents for preoperative history and physical with a diagnosis of left shoulder pain after he had a electrocution and fall off of a ladder over to a year ago. Symptoms are rated as moderate to severe, and have been worsening.  This is significantly impairing activities of daily living.  He has elected for surgical management.  He has had multiple injections as well as exercises and physical therapy and continues to have persistent pain.  Past Medical History  Diagnosis Date  . Seizure after head injury ([place]) 12/14/2013  . 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 Paroxysmal atrial fibrillation ([facility])   . Hypertension   . Shoulder pain, left    Past Surgical History  Procedure Laterality Date  . Knee scopes Bilateral   . Nasal septum surgery    . Right inguinal hernia      child  . Tonsilectomy/adenoidectomy with myringotomy      child   Social History   Social History  . Marital Status: Married    Spouse Name: N/A  . Number of Children: 3  . Years of Education: MD   Occupational History  . [employer]    Social History Main Topics  . Smoking status: Never Smoker   . Smokeless tobacco: Never Used  . Alcohol Use: 4.2 - 8.4 oz/week    7-14 Standard drinks or equivalent per week     Comment: glass of wine nightly  . Drug Use: No  . Sexual Activity: Not Asked   Other Topics Concern  . None   Social History Narrative   Married (2nd marriage in 2010). 3 children from first marriage. 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   Patient drinks about 1 cup of  caffeine daily and occasionally has a soda.    Family History  Problem Relation Age of Onset  . Hypertension Brother     smoker  . CAD Brother     stent, smoker  . Cancer - Other Father     renal cell metastatic   Allergies  Allergen Reactions  . Cardura [Doxazosin Mesylate]     Caused patient to go into a. Fib.  . Simvastatin     REACTION: muscle aches   Prior to Admission medications   Medication Sig Start Date End Date Taking? Authorizing Provider  aspirin 81 MG tablet Take 81 mg by mouth daily.   Yes Historical Provider, MD  lisinopril (PRINIVIL,ZESTRIL) 5 MG tablet Take 1 tablet (5 mg total) by mouth daily. 06/02/15  Yes [name], MD     Positive ROS: All other systems have been reviewed and were otherwise negative with the exception of those mentioned in the HPI and as above.  Physical Exam: General: Alert, no acute distress Cardiovascular: No pedal edema Respiratory: No cyanosis, no use of accessory musculature GI: No organomegaly, abdomen is soft and non-tender Skin: No lesions in the area of chief complaint Neurologic: Sensation intact distally Psychiatric: Patient is competent for consent with normal mood and affect Lymphatic: No axillary or  cervical lymphadenopathy  MUSCULOSKELETAL:  Left shoulder has intact rotator cuff strength, sensation intact distally, positive impingement signs, positive pain with active motion.  Assessment:  Left shoulder pain, questionable posterior labral injury from a electrocution related injury and fall off a ladder, possible impingement syndrome  Plan: Plan for Procedure(s): LEFT SHOULDER ARTHROSCOPY WITH  POSSIBLE POSTERIOR BANKART REPAIR, AND ACROMIOPLASTY DEPENDING ON OPERATIVE FINDINGS  The risks benefits and alternatives were discussed with the patient including but not limited to the risks of nonoperative treatment, versus surgical intervention including infection, bleeding, nerve injury,  blood clots,  cardiopulmonary complications, morbidity, mortality, among others, and they were willing to proceed. We've also discussed the risks for incomplete relief of symptoms, persistent pain, stiffness, among others.  Johnny Bridge, MD Cell [phone]   06/27/2015 7:10 AM

## 2015-06-27 NOTE — Anesthesia Procedure Notes (Addendum)
Anesthesia Regional Block:  Interscalene brachial plexus block  Pre-Anesthetic Checklist: ,, timeout performed, Correct Patient, Correct Site, Correct Laterality, Correct Procedure, Correct Position, site marked, Risks and benefits discussed, pre-op evaluation,  At surgeon's request and post-op pain management  Laterality: Left  Prep: Maximum Sterile Barrier Precautions used and chloraprep       Needles:  Injection technique: Single-shot  Needle Type: Echogenic Stimulator Needle     Needle Length: 5cm 5 cm Needle Gauge: 22 and 22 G    Additional Needles:  Procedures: ultrasound guided (picture in chart) and nerve stimulator Interscalene brachial plexus block  Nerve Stimulator or Paresthesia:  Response: Biceps response,   Additional Responses:   Narrative:  Start time: 06/27/2015 7:05 AM End time: 06/27/2015 7:15 AM Injection made incrementally with aspirations every 5 mL. Anesthesiologist: Roderic Palau  Additional Notes: 2% Lidocaine skin wheel.    Procedure Name: Intubation Performed by: Terrance Mass Pre-anesthesia Checklist: Patient identified, Timeout performed, Emergency Drugs available, Suction available and Patient being monitored Patient Re-evaluated:Patient Re-evaluated prior to inductionOxygen Delivery Method: Circle system utilized Preoxygenation: Pre-oxygenation with 100% oxygen Intubation Type: IV induction Ventilation: Mask ventilation without difficulty Laryngoscope Size: Miller and 2 Grade View: Grade I Tube type: Oral Tube size: 7.0 mm Number of attempts: 1 Airway Equipment and Method: Stylet Placement Confirmation: ETT inserted through vocal cords under direct vision,  positive ETCO2 and breath sounds checked- equal and bilateral Secured at: 22 cm Tube secured with: Tape Dental Injury: Teeth and Oropharynx as per pre-operative assessment

## 2015-06-27 NOTE — Anesthesia Postprocedure Evaluation (Signed)
  Anesthesia Post-op Note  Patient: Nicholas Perry  Procedure(s) Performed: Procedure(s): SHOULDER ARTHROSCOPY WITH ROTATOR CUFF REPAIR (Left) SHOULDER ACROMIOPLASTY DEBRIDE LABRUM (Left)  Patient Location: PACU  Anesthesia Type:General and block  Level of Consciousness: awake and alert   Airway and Oxygen Therapy: Patient Spontanous Breathing  Post-op Pain: Controlled  Post-op Assessment: Post-op Vital signs reviewed, Patient's Cardiovascular Status Stable and Respiratory Function Stable  Post-op Vital Signs: Reviewed  Filed Vitals:   06/27/15 1045  BP: 110/92  Pulse:   Temp:   Resp: 18    Complications: No apparent anesthesia complications

## 2015-06-27 NOTE — Discharge Instructions (Signed)
Diet: As you were doing prior to hospitalization   Shower:  May shower but keep the wounds dry, use an occlusive plastic wrap, NO SOAKING IN TUB.  If the bandage gets wet, change with a clean dry gauze.  If you have a splint on, leave the splint in place and keep the splint dry with a plastic bag.  Dressing:  You may change your dressing 3-5 days after surgery, unless you have a splint.  If you have a splint, then just leave the splint in place and we will change your bandages during your first follow-up appointment.    If you had hand or foot surgery, we will plan to remove your stitches in about 2 weeks in the office.  For all other surgeries, there are sticky tapes (steri-strips) on your wounds and all the stitches are absorbable.  Leave the steri-strips in place when changing your dressings, they will peel off with time, usually 2-3 weeks.  Activity:  Increase activity slowly as tolerated, but follow the weight bearing instructions below.  The rules on driving is that you can not be taking narcotics while you drive, and you must feel in control of the vehicle.    Weight Bearing:   Sling at all times except for hygiene.    To prevent constipation: you may use a stool softener such as -  Colace (over the counter) 100 mg by mouth twice a day  Drink plenty of fluids (prune juice may be helpful) and high fiber foods Miralax (over the counter) for constipation as needed.    Itching:  If you experience itching with your medications, try taking only a single pain pill, or even half a pain pill at a time.  You may take up to 10 pain pills per day, and you can also use benadryl over the counter for itching or also to help with sleep.   Precautions:  If you experience chest pain or shortness of breath - call 911 immediately for transfer to the hospital emergency department!!  If you develop a fever greater that 101 F, purulent drainage from wound, increased redness or drainage from wound, or calf pain  -- Call the office at [phone]                                                Follow- Up Appointment:  Please call for an appointment to be seen in 2 weeks [place] - [phone]      Post Anesthesia Home Care Instructions  Activity: Get plenty of rest for the remainder of the day. A responsible adult should stay with you for 24 hours following the procedure.  For the next 24 hours, DO NOT: -Drive a car -Paediatric nurse -Drink alcoholic beverages -Take any medication unless instructed by your physician -Make any legal decisions or sign important papers.  Meals: Start with liquid foods such as gelatin or soup. Progress to regular foods as tolerated. Avoid greasy, spicy, heavy foods. If nausea and/or vomiting occur, drink only clear liquids until the nausea and/or vomiting subsides. Call your physician if vomiting continues.  Special Instructions/Symptoms: Your throat may feel dry or sore from the anesthesia or the breathing tube placed in your throat during surgery. If this causes discomfort, gargle with warm salt water. The discomfort should disappear within 24 hours.  If you had a scopolamine patch placed behind  your ear for the management of post- operative nausea and/or vomiting:  1. The medication in the patch is effective for 72 hours, after which it should be removed.  Wrap patch in a tissue and discard in the trash. Wash hands thoroughly with soap and water. 2. You may remove the patch earlier than 72 hours if you experience unpleasant side effects which may include dry mouth, dizziness or visual disturbances. 3. Avoid touching the patch. Wash your hands with soap and water after contact with the patch.   Regional Anesthesia Blocks  1. Numbness or the inability to move the "blocked" extremity may last from 3-48 hours after placement. The length of time depends on the medication injected and your individual response to the medication. If the numbness is not going away  after 48 hours, call your surgeon.  2. The extremity that is blocked will need to be protected until the numbness is gone and the  Strength has returned. Because you cannot feel it, you will need to take extra care to avoid injury. Because it may be weak, you may have difficulty moving it or using it. You may not know what position it is in without looking at it while the block is in effect.  3. For blocks in the legs and feet, returning to weight bearing and walking needs to be done carefully. You will need to wait until the numbness is entirely gone and the strength has returned. You should be able to move your leg and foot normally before you try and bear weight or walk. You will need someone to be with you when you first try to ensure you do not fall and possibly risk injury.  4. Bruising and tenderness at the needle site are common side effects and will resolve in a few days.  5. Persistent numbness or new problems with movement should be communicated to the surgeon or the [place] [phone] [place] [phone]).
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
## 2015-06-27 NOTE — Anesthesia Preprocedure Evaluation (Signed)
Anesthesia Evaluation  Patient identified by MRN, date of birth, ID band Patient awake    Reviewed: Allergy & Precautions, H&P , NPO status , Patient's Chart, lab work & pertinent test results  Airway Mallampati: II  TM Distance: >3 FB Neck ROM: Full    Dental no notable dental hx. (+) Teeth Intact, Dental Advisory Given   Pulmonary neg pulmonary ROS,    Pulmonary exam normal breath sounds clear to auscultation       Cardiovascular hypertension, Pt. on medications negative cardio ROS  + dysrhythmias Atrial Fibrillation  Rhythm:Irregular Rate:Tachycardia     Neuro/Psych negative neurological ROS  negative psych ROS   GI/Hepatic negative GI ROS, Neg liver ROS,   Endo/Other  negative endocrine ROS  Renal/GU negative Renal ROS  negative genitourinary   Musculoskeletal   Abdominal   Peds  Hematology negative hematology ROS (+)   Anesthesia Other Findings   Reproductive/Obstetrics negative OB ROS                             Anesthesia Physical Anesthesia Plan  ASA: III  Anesthesia Plan: General and Regional   Post-op Pain Management: GA combined w/ Regional for post-op pain   Induction: Intravenous  Airway Management Planned: Oral ETT  Additional Equipment:   Intra-op Plan:   Post-operative Plan: Extubation in OR  Informed Consent: I have reviewed the patients History and Physical, chart, labs and discussed the procedure including the risks, benefits and alternatives for the proposed anesthesia with the patient or authorized representative who has indicated his/her understanding and acceptance.   Dental advisory given  Plan Discussed with: CRNA  Anesthesia Plan Comments:         Anesthesia Quick Evaluation

## 2015-06-27 NOTE — Progress Notes (Signed)
Assisted Dr. Edmond Fitzgerald with left, ultrasound guided, interscalene  block. Side rails up, monitors on throughout procedure. See vital signs in flow sheet. Tolerated Procedure well.  

## 2015-06-27 NOTE — Transfer of Care (Signed)
Immediate Anesthesia Transfer of Care Note  Patient: Nicholas Perry  Procedure(s) Performed: Procedure(s): SHOULDER ARTHROSCOPY WITH ROTATOR CUFF REPAIR (Left) SHOULDER ACROMIOPLASTY DEBRIDE LABRUM (Left)  Patient Location: PACU  Anesthesia Type:General  Level of Consciousness: awake and sedated  Airway & Oxygen Therapy: Patient Spontanous Breathing and Patient connected to face mask oxygen  Post-op Assessment: Report given to RN and Post -op Vital signs reviewed and stable  Post vital signs: Reviewed and stable  Last Vitals:  Filed Vitals:   06/27/15 0715  BP: 114/85  Pulse: 70  Temp:   Resp: 16    Complications: No apparent anesthesia complications

## 2015-06-30 ENCOUNTER — Encounter (HOSPITAL_BASED_OUTPATIENT_CLINIC_OR_DEPARTMENT_OTHER): Payer: Self-pay | Admitting: Orthopedic Surgery

## 2015-07-01 ENCOUNTER — Encounter: Payer: Self-pay | Admitting: Family Medicine

## 2015-07-01 DIAGNOSIS — I480 Paroxysmal atrial fibrillation: Secondary | ICD-10-CM

## 2015-07-01 NOTE — Telephone Encounter (Signed)
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 He would like to see provider within [facility]- I would prefer an MD or DO. He requested to be seen within a month and I think that is reasonable.   Thanks, Annie Main

## 2015-07-18 ENCOUNTER — Encounter: Payer: Self-pay | Admitting: Cardiology

## 2015-07-18 ENCOUNTER — Encounter: Payer: Self-pay | Admitting: Family Medicine

## 2015-07-18 ENCOUNTER — Ambulatory Visit (INDEPENDENT_AMBULATORY_CARE_PROVIDER_SITE_OTHER): Payer: BLUE CROSS/BLUE SHIELD | Admitting: Cardiology

## 2015-07-18 VITALS — BP 144/88 | HR 61 | Ht 73.0 in | Wt 175.1 lb

## 2015-07-18 DIAGNOSIS — R002 Palpitations: Secondary | ICD-10-CM

## 2015-07-18 DIAGNOSIS — I340 Nonrheumatic mitral (valve) insufficiency: Secondary | ICD-10-CM

## 2015-07-18 MED ORDER — METOPROLOL TARTRATE 25 MG PO TABS: 25.0000 mg | ORAL_TABLET | ORAL | Status: DC | PRN
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
## 2015-07-18 NOTE — Patient Instructions (Signed)
Your physician recommends that you schedule a follow-up appointment in: [place] has recommended you make the following change in your medication: START Metoprolol 25 mg as needed  Your physician has requested that you have an echocardiogram. Echocardiography is a painless test that uses sound waves to create images of your heart. It provides your doctor with information about the size and shape of your heart and how well your heart's chambers and valves are working. This procedure takes approximately one hour. There are no restrictions for this procedure.  Your physician has recommends that you schedule a Coronary Calcium Scoring Test  Merry Christmas and Happy New Year!!

## 2015-07-31 ENCOUNTER — Ambulatory Visit (INDEPENDENT_AMBULATORY_CARE_PROVIDER_SITE_OTHER)
Admission: RE | Admit: 2015-07-31 | Discharge: 2015-07-31 | Disposition: A | Discharge status: Home / Self Care | Payer: BLUE CROSS/BLUE SHIELD | Source: Ambulatory Visit | Attending: Cardiology | Admitting: Cardiology

## 2015-07-31 ENCOUNTER — Ambulatory Visit (HOSPITAL_COMMUNITY): Payer: BLUE CROSS/BLUE SHIELD | Attending: Cardiovascular Disease

## 2015-07-31 ENCOUNTER — Other Ambulatory Visit: Payer: Self-pay

## 2015-07-31 DIAGNOSIS — R002 Palpitations: Secondary | ICD-10-CM

## 2015-07-31 DIAGNOSIS — I340 Nonrheumatic mitral (valve) insufficiency: Secondary | ICD-10-CM

## 2015-08-07 ENCOUNTER — Telehealth: Payer: Self-pay | Admitting: *Deleted

## 2015-08-07 DIAGNOSIS — I340 Nonrheumatic mitral (valve) insufficiency: Secondary | ICD-10-CM

## 2015-08-07 DIAGNOSIS — R002 Palpitations: Secondary | ICD-10-CM

## 2015-08-07 NOTE — Telephone Encounter (Signed)
-----   Message from Minus Breeding, MD sent at 08/07/2015  3:15 PM EST ----- Reviewed with the patient.  He will need a POET (Plain Old Exercise Treadmill).  Send results to Garret Reddish, MD

## 2015-08-07 NOTE — Telephone Encounter (Signed)
Dr Percival Spanish reviewed result with pt, POET (plain old exercise treadmill) was order and message send to scheduler to schedule for pt.  Result send to pt PCP

## 2015-08-12 ENCOUNTER — Encounter: Payer: Self-pay | Admitting: Family Medicine

## 2015-08-19 ENCOUNTER — Encounter: Payer: BLUE CROSS/BLUE SHIELD | Admitting: Physician Assistant

## 2015-08-19 ENCOUNTER — Other Ambulatory Visit: Payer: Self-pay | Admitting: Physician Assistant

## 2015-08-19 ENCOUNTER — Ambulatory Visit (INDEPENDENT_AMBULATORY_CARE_PROVIDER_SITE_OTHER): Payer: BLUE CROSS/BLUE SHIELD

## 2015-08-19 DIAGNOSIS — R002 Palpitations: Secondary | ICD-10-CM

## 2015-08-19 DIAGNOSIS — I340 Nonrheumatic mitral (valve) insufficiency: Secondary | ICD-10-CM

## 2015-08-19 DIAGNOSIS — R9439 Abnormal result of other cardiovascular function study: Secondary | ICD-10-CM

## 2015-08-19 LAB — EXERCISE TOLERANCE TEST
CHL CUP MPHR: 157 {beats}/min
CHL RATE OF PERCEIVED EXERTION: 15
CSEPEDS: 0 s
CSEPEW: 13.4 METS
CSEPHR: 97 %
CSEPPHR: 153 {beats}/min
Exercise duration (min): 12 min
Rest HR: 63 {beats}/min

## 2015-08-20 ENCOUNTER — Ambulatory Visit (HOSPITAL_COMMUNITY)
Admission: RE | Admit: 2015-08-20 | Discharge: 2015-08-20 | Disposition: A | Discharge status: Home / Self Care | Payer: BLUE CROSS/BLUE SHIELD | Source: Ambulatory Visit | Attending: Internal Medicine | Admitting: Internal Medicine

## 2015-08-20 DIAGNOSIS — I10 Essential (primary) hypertension: Secondary | ICD-10-CM | POA: Diagnosis not present

## 2015-08-20 DIAGNOSIS — R002 Palpitations: Secondary | ICD-10-CM | POA: Diagnosis not present

## 2015-08-20 DIAGNOSIS — Z8249 Family history of ischemic heart disease and other diseases of the circulatory system: Secondary | ICD-10-CM | POA: Insufficient documentation

## 2015-08-20 DIAGNOSIS — R9439 Abnormal result of other cardiovascular function study: Secondary | ICD-10-CM | POA: Insufficient documentation

## 2015-08-20 LAB — MYOCARDIAL PERFUSION IMAGING
CHL CUP RESTING HR STRESS: 53 {beats}/min
CSEPED: 11 min
CSEPEDS: 30 s
CSEPEW: 13.4 METS
CSEPPHR: 150 {beats}/min
LV dias vol: 128 mL
LVSYSVOL: 62 mL
MPHR: 157 {beats}/min
NUC STRESS TID: 0.99
Percent HR: 95 %
RPE: 16
SDS: 2
SRS: 1
SSS: 3

## 2015-08-20 MED ORDER — TECHNETIUM TC 99M SESTAMIBI GENERIC - CARDIOLITE
10.9000 | Freq: Once | INTRAVENOUS | Status: AC | PRN
  Administered 2015-08-20: 10.9 via INTRAVENOUS

## 2015-08-20 MED ORDER — TECHNETIUM TC 99M SESTAMIBI GENERIC - CARDIOLITE
30.5000 | Freq: Once | INTRAVENOUS | Status: AC | PRN
  Administered 2015-08-20: 31 via INTRAVENOUS

## 2015-08-22 ENCOUNTER — Ambulatory Visit (INDEPENDENT_AMBULATORY_CARE_PROVIDER_SITE_OTHER): Payer: BLUE CROSS/BLUE SHIELD | Admitting: Cardiology

## 2015-08-22 ENCOUNTER — Encounter: Payer: Self-pay | Admitting: Cardiology

## 2015-08-22 VITALS — BP 146/90 | HR 64 | Ht 73.0 in | Wt 176.6 lb

## 2015-08-22 DIAGNOSIS — I480 Paroxysmal atrial fibrillation: Secondary | ICD-10-CM

## 2015-08-22 MED ORDER — LISINOPRIL 10 MG PO TABS: 10.0000 mg | ORAL_TABLET | Freq: Every day | ORAL | Status: DC

## 2015-08-22 NOTE — Progress Notes (Signed)
Cardiology Office Note   Date:  08/22/2015   ID:  Nicholas Perry, DOB [birth year], MRN [id number]  PCP:  Garret Reddish, MD  Cardiologist:   Minus Breeding, MD   No chief complaint on file.     History of Present Illness: Nicholas Perry is a 65 y.o. male who presents for evaluation of atrial fibrillation.  His past cardiac history includes paroxysmal atrial fibrillation. He had an echocardiogram in 2010 with an EF 55% mild mitral regurgitation.  He was treated initially with beta blocker pill in pocket.  He is status post electrocution accident apparently and trauma with resultant seizures.   He's been having recurrent episodes of palpitations. He's had documented atrial fibrillation. At the first visit I sent him for echocardiography which was essentially normal.  Coronary calcium did demonstrate some mild calcium putting him at the 57th percentile for age with some calcium in the LAD. Exercise treadmill testing was somewhat equivocal but suggested possible ischemia. However, stress perfusion imaging was normal. He returns for follow-up.  Since I saw him he did have one sustained episode of atrial fibrillation.  However, this resolved after many hours. He is otherwise done well. The patient denies any new symptoms such as chest discomfort, neck or arm discomfort. There has been no new shortness of breath, PND or orthopnea. There have been no reported palpitations, presyncope or syncope.   Past Medical History  Diagnosis Date  . Seizure after head injury ([place]) 12/14/2013  . 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 Paroxysmal atrial fibrillation (HCC)   . Hypertension   . Shoulder pain, left   . Partial tear of rotator cuff 06/27/2015    Past Surgical History  Procedure Laterality Date  . Knee scopes Bilateral   . Nasal septum surgery    . Right inguinal hernia      child  . Tonsilectomy/adenoidectomy with myringotomy      child  . Shoulder arthroscopy with rotator cuff repair Left  06/27/2015    Procedure: SHOULDER ARTHROSCOPY WITH ROTATOR CUFF REPAIR;  Surgeon: [name], MD;  Location: [facility];  Service: Orthopedics;  Laterality: Left;  . Shoulder acromioplasty Left 06/27/2015    Procedure: SHOULDER ACROMIOPLASTY DEBRIDE LABRUM;  Surgeon: [name], MD;  Location: [facility];  Service: Orthopedics;  Laterality: Left;     Current Outpatient Prescriptions  Medication Sig Dispense Refill  . aspirin 81 MG tablet Take 81 mg by mouth daily.    Marland Kitchen lisinopril (PRINIVIL,ZESTRIL) 5 MG tablet Take 10 mg by mouth daily.    . metoprolol tartrate (LOPRESSOR) 25 MG tablet Take 1 tablet (25 mg total) by mouth as needed. 30 tablet 6   No current facility-administered medications for this visit.    Allergies:   Cardura and Simvastatin    ROS:  Please see the history of present illness.   Otherwise, review of systems are positive for none.   All other systems are reviewed and negative.    PHYSICAL EXAM: VS:  BP 146/90 mmHg  Pulse 64  Ht 6\' 1"  (1.854 m)  Wt 176 lb 9 oz (80.088 kg)  BMI 23.30 kg/m2 , BMI Body mass index is 23.3 kg/(m^2). GENERAL:  Well appearing NECK:  No jugular venous distention, waveform within normal limits, carotid upstroke brisk and symmetric, no bruits, no thyromegaly LUNGS:  Clear to auscultation bilaterally BACK:  No CVA tenderness CHEST:  Unremarkable HEART:  PMI not displaced or sustained,S1 and S2 within normal limits, no S3, no  S4, no clicks, no rubs, no murmurs ABD:  Flat, positive bowel sounds normal in frequency in pitch, no bruits, no rebound, no guarding, no midline pulsatile mass, no hepatomegaly, no splenomegaly EXT:  2 plus pulses throughout, no edema, no cyanosis no clubbing   EKG:  EKG is not rdered today.   Recent Labs: 11/07/2014: ALT 30; Platelets 199; TSH 0.70 06/27/2015: BUN 24*; Creatinine, Ser 1.20; Hemoglobin 17.0; Potassium 4.0; Sodium 141    Lipid Panel    Component Value  Date/Time   CHOL 258* 11/07/2014   TRIG 54 11/07/2014   HDL 123* 11/07/2014   CHOLHDL 2 08/12/2009 1011   VLDL 15.4 08/12/2009 1011   LDLCALC 124 11/07/2014   LDLDIRECT 151.1 08/12/2009 1011   Lab Results  Component Value Date   CHOL 258* 11/07/2014   TRIG 54 11/07/2014   HDL 123* 11/07/2014   LDLCALC 124 11/07/2014   LDLDIRECT 151.1 08/12/2009       Wt Readings from Last 3 Encounters:  08/22/15 176 lb 9 oz (80.088 kg)  08/20/15 175 lb (79.379 kg)  07/18/15 175 lb 1.6 oz (79.425 kg)      Other studies Reviewed: Additional studies/ records that were reviewed today include: Lexiscan Myoview.    ASSESSMENT AND PLAN:  ATRIAL FIB:  He has paroxysmal atrial fibrillation. Dr. [name] has a CHA2DS2 - VASc score of 1 with a risk of stroke of 1%.  There is not an indication for anticoagulation at this point. We did talk about therapy should he have any further sustained episodes. At that point he would present to the emergency room for a flecainide bolus by mouth. If he succeeds with that therapy would then be able to do that at home. If he has more frequent episodes I would put him on maintenance flecainide.  MR:  He had no MR on echo.  He did have mild mitral valve prolapse.  HTN:  He will remain on the meds as listed.  POSITIVE CORONARY CALCIUM:  We had a long discussion about this. I will follow him with stress test in the future. His stress perfusion study was negative and I did review his treadmill and thought that he had mild upsloping ST depression. I think would be reasonable screen him with POET (Plain Old Exercise Treadmill)s in the future.  DYSLIPIDEMIA:  He has a good HDL. There is no indication for a statin and we had a long discussion about this.   (Greater than 20 minutes reviewing all data with greater than 50% face to face with the patient).   Current medicines are reviewed at length with the patient today.  The patient does not have concerns regarding  medicines.  The following changes have been made:  no change  Labs/ tests ordered today include:   No orders of the defined types were placed in this encounter.     Disposition:   FU with me in 6 months.    Signed, Minus Breeding, MD  08/22/2015 10:04 AM    [facility]

## 2015-08-22 NOTE — Patient Instructions (Signed)
Medication Instructions:  Your physician recommends that you continue on your current medications as directed. Please refer to the Current Medication list given to you today.  Labwork: none  Testing/Procedures: None  Follow-Up: Your physician wants you to follow-up in: 6 month ov You will receive a reminder letter in the mail two months in advance. If you don't receive a letter, please call our office to schedule the follow-up appointment.  If you need a refill on your cardiac medications before your next appointment, please call your pharmacy.

## 2015-12-01 ENCOUNTER — Ambulatory Visit (INDEPENDENT_AMBULATORY_CARE_PROVIDER_SITE_OTHER): Payer: BLUE CROSS/BLUE SHIELD | Admitting: Family Medicine

## 2015-12-01 ENCOUNTER — Encounter: Payer: Self-pay | Admitting: Family Medicine

## 2015-12-01 VITALS — BP 110/70 | HR 57 | Temp 98.2°F | Wt 174.0 lb

## 2015-12-01 DIAGNOSIS — Z0000 Encounter for general adult medical examination without abnormal findings: Secondary | ICD-10-CM

## 2015-12-01 LAB — CBC
HEMATOCRIT: 42.5 % (ref 39.0–52.0)
HEMOGLOBIN: 14.2 g/dL (ref 13.0–17.0)
MCHC: 33.4 g/dL (ref 30.0–36.0)
MCV: 93 fl (ref 78.0–100.0)
Platelets: 197 10*3/uL (ref 150.0–400.0)
RBC: 4.56 Mil/uL (ref 4.22–5.81)
RDW: 14.3 % (ref 11.5–15.5)
WBC: 4.5 10*3/uL (ref 4.0–10.5)

## 2015-12-01 LAB — LIPID PANEL
CHOLESTEROL: 260 mg/dL — AB (ref 0–200)
HDL: 129.1 mg/dL (ref >39.00)
LDL Cholesterol: 118 mg/dL — ABNORMAL HIGH (ref 0–99)
NONHDL: 130.76
Total CHOL/HDL Ratio: 2
Triglycerides: 62 mg/dL (ref 0.0–149.0)
VLDL: 12.4 mg/dL (ref 0.0–40.0)

## 2015-12-01 LAB — COMPREHENSIVE METABOLIC PANEL
ALBUMIN: 4.3 g/dL (ref 3.5–5.2)
ALK PHOS: 47 U/L (ref 39–117)
ALT: 15 U/L (ref 0–53)
AST: 18 U/L (ref 0–37)
BILIRUBIN TOTAL: 0.7 mg/dL (ref 0.2–1.2)
BUN: 15 mg/dL (ref 6–23)
CO2: 30 mEq/L (ref 19–32)
Calcium: 9.4 mg/dL (ref 8.4–10.5)
Chloride: 102 mEq/L (ref 96–112)
Creatinine, Ser: 1.13 mg/dL (ref 0.40–1.50)
GFR: 69.55 mL/min (ref >60.00)
GLUCOSE: 92 mg/dL (ref 70–99)
POTASSIUM: 4.5 meq/L (ref 3.5–5.1)
SODIUM: 138 meq/L (ref 135–145)
TOTAL PROTEIN: 6.8 g/dL (ref 6.0–8.3)

## 2015-12-01 LAB — TSH: TSH: 0.39 u[IU]/mL (ref 0.35–4.50)

## 2015-12-01 LAB — PSA: PSA: 0.67 ng/mL (ref 0.10–4.00)

## 2015-12-01 MED ORDER — LISINOPRIL 10 MG PO TABS: 10.0000 mg | ORAL_TABLET | Freq: Every day | ORAL | Status: DC

## 2015-12-01 NOTE — Patient Instructions (Addendum)
Labs before you go- will send mychart message with results  Congrats on selling your house. Will certainly miss having you as a patient. Glad to help you in the transition- mychart still works from [place] if there is anything I can do for you

## 2015-12-01 NOTE — Progress Notes (Signed)
Phone: [phone]  Subjective:  Patient presents today for their annual physical. Chief complaint-noted.   See problem oriented charting- ROS- full  review of systems was completed and negative except for: does have palpitations due to a fib at times- following with cardiology for this. Still has some slowed speech but improving. Memory improving somehwat as well  The following were reviewed and entered/updated in epic: Past Medical History  Diagnosis Date  . Seizure after head injury ([place]) 12/14/2013  . 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 Paroxysmal atrial fibrillation (HCC)   . Hypertension   . Shoulder pain, left   . Partial tear of rotator cuff 06/27/2015   Patient Active Problem List   Diagnosis Date Noted  . 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 Seizure after head injury ([place]) 12/14/2013    Priority: High  . Atrial fibrillation ([place]) 09/22/2009    Priority: High  . BPH (benign prostatic hyperplasia) 07/18/2014    Priority: Medium  . Hyperlipidemia 01/02/2008    Priority: Medium  . Essential hypertension 01/02/2008    Priority: Medium  . Partial tear of rotator cuff 06/27/2015   Past Surgical History  Procedure Laterality Date  . Knee scopes Bilateral   . Nasal septum surgery    . Right inguinal hernia      child  . Tonsilectomy/adenoidectomy with myringotomy      child  . Shoulder arthroscopy with rotator cuff repair Left 06/27/2015    Procedure: SHOULDER ARTHROSCOPY WITH ROTATOR CUFF REPAIR;  Surgeon: [name], MD;  Location: [facility];  Service: Orthopedics;  Laterality: Left;  . Shoulder acromioplasty Left 06/27/2015    Procedure: SHOULDER ACROMIOPLASTY DEBRIDE LABRUM;  Surgeon: [name], MD;  Location: [facility];  Service: Orthopedics;  Laterality: Left;    Family History  Problem Relation Age of Onset  . Hypertension Brother     smoker  . CAD Brother 16    stent, smoker  .  Cancer - Other Father     renal cell metastatic  . CAD Father 80    Medications- reviewed and updated Current Outpatient Prescriptions  Medication Sig Dispense Refill  . aspirin 81 MG tablet Take 81 mg by mouth daily.    Marland Kitchen lisinopril (PRINIVIL,ZESTRIL) 10 MG tablet Take 1 tablet (10 mg total) by mouth daily. 90 tablet 3   No current facility-administered medications for this visit.    Allergies-reviewed and updated Allergies  Allergen Reactions  . Cardura [Doxazosin Mesylate]     Caused patient to go into a. Fib.  . Simvastatin     REACTION: muscle aches    Social History   Social History  . Marital Status: Married    Spouse Name: N/A  . Number of Children: 3  . Years of Education: MD   Occupational History  . [employer]    Social History Main Topics  . Smoking status: Never Smoker   . Smokeless tobacco: Never Used  . Alcohol Use: 4.2 - 8.4 oz/week    7-14 Standard drinks or equivalent per week     Comment: glass of wine nightly  . Drug Use: No  . Sexual Activity: Not Asked   Other Topics Concern  . None   Social History Narrative   Married (2nd marriage in 2010). 3 children from first marriage. 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   Patient drinks about 1 cup of caffeine daily and occasionally has a soda.     ROS--See HPI   Objective: BP 110/70 mmHg  Pulse 57  Temp(Src) 98.2 F (36.8 C)  Wt 174 lb (78.926 kg) Gen: NAD, resting comfortably HEENT: Mucous membranes are moist. Oropharynx normal Neck: no thyromegaly CV: RRR no murmurs rubs or gallops Lungs: CTAB no crackles, wheeze, rhonchi Abdomen:  soft/nontender/nondistended/normal bowel sounds. No rebound or guarding.  Rectal: normal tone, diffusely enlarged prostate, no masses or tenderness Ext: no edema Skin: warm, dry Neuro: grossly normal, moves all extremities, PERRLA  Assessment/Plan:  65 y.o. male presenting for annual physical.  Health Maintenance counseling: 1. Anticipatory guidance: Patient counseled regarding regular dental exams, eye exams, wearing seatbelts.  2. Risk factor reduction:  Advised patient of need for regular exercise and diet rich and fruits and vegetables to reduce risk of heart attack and stroke.  3. Immunizations/screenings/ancillary studies Immunization History  Administered Date(s) Administered  . Influenza Whole 04/11/2009  . Influenza-Unspecified 04/23/2014  . Td 06/09/2009  . Zoster 07/18/2014   4. Prostate cancer screening- low risk based off rectal, will get PSA   Lab Results  Component Value Date   PSA 0.68 11/07/2014   PSA 0.84 08/12/2009   5. Colon cancer screening - 09/24/14 with 5 year follow up due to polyp (history of adenoma  A fib- following with cards- considering flecainide Post concussive encephalopathy continues to have some slight improvements Hyperlipidemia- get lipids and check 10 year risk HTN controlled on lisinopril 10mg - refilled for year BPH- stable on exam  Moving to [place] but plans to return for CPE in 1 year likely.  Return precautions advised.   Orders Placed This Encounter  Procedures  . CBC    [place]  . Comprehensive metabolic panel    [place]    Order Specific Question:  Has the patient fasted?    Answer:  No  . Lipid panel    [place]    Order Specific Question:  Has the patient fasted?    Answer:  No  . PSA  . TSH    [place]   Garret Reddish, MD

## 2015-12-08 ENCOUNTER — Encounter: Payer: Self-pay | Admitting: Neurology

## 2015-12-12 NOTE — Telephone Encounter (Signed)
Called pt and spoke to Nicholas Perry. Appt rescheduled for Wed 9/27 @ 8 a.m.

## 2015-12-12 NOTE — Telephone Encounter (Signed)
-----   Message from Kathrynn Ducking, MD sent at 12/08/2015  9:54 AM EDT ----- This patient has an appointment with me on September 20, but he will be out of town. Please try to get him worked in for a revisit on the following week of September 25. Thank you.

## 2016-01-13 ENCOUNTER — Ambulatory Visit: Payer: BLUE CROSS/BLUE SHIELD | Attending: Neurology | Admitting: Psychology

## 2016-01-13 DIAGNOSIS — S069X0S Unspecified intracranial injury without loss of consciousness, sequela: Secondary | ICD-10-CM

## 2016-01-13 DIAGNOSIS — F068 Other specified mental disorders due to known physiological condition: Secondary | ICD-10-CM | POA: Diagnosis not present

## 2016-01-13 DIAGNOSIS — T754XXS Electrocution, sequela: Secondary | ICD-10-CM

## 2016-01-14 ENCOUNTER — Telehealth: Payer: Self-pay | Admitting: Neurology

## 2016-01-14 ENCOUNTER — Encounter: Payer: Self-pay | Admitting: Psychology

## 2016-01-14 NOTE — Progress Notes (Addendum)
[facility]  [address]   Telephone [phone] [address] Fax [phone] [address], [place] [address]   [place] EVALUATION June 2017  *CONFIDENTIAL* This report should not be released without the consent of the client  Name:   Nicholas Perry  Date of Birth:  2051/01/21 [facility] MR#:  [id number] Date of Evaluation: 01/13/16  Background Nicholas Perry is a 65 year-old, right-handed man who returns at his request for a repeat neuropsychological evaluation to obtain an updated assessment of his cognitive functioning. He suffered an electrocution injury and a traumatic brain injury in an unwitnessed fall off of a ladder on 12/14/13.   He previously underwent neuropsychological evaluation in August 2015, November 2015 and May 2016. The initial neuropsychological evaluation identified mild impairment of executive and attentional processes, intermittent speech dysfluency, difficulty with complex word finding and slowed bimanual manipulative dexterity speed. His most recent neuropsychological evaluation indicated persisting deficits in the areas of mental processing speed, complex attention and cognitive flexibility. Memory and concept formation were well-preserved. He has not reported nor demonstrated emotional difficulties, behavioral disturbance or problems with psychosocial adjustment since his injury. 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 He has been on long-term disability.  Review of medical records indicated that in the interval since he was last seen he underwent left shoulder rotator cuff repair surgery in November 2016. He continues to be followed by a cardiologist for a history of paroxysmal atrial fibrillation. His current medications are aspirin and lisinopril.  Report of Patient Nicholas Perry continues to report distractibility, propensity to become mentally fatigued to cognitive challenge and difficulty performing more  than one task or activity at a time. He stated that within the past year he has become more aware of his cognitive limitations and has become more "realistic" about not being able to return to work.   Other problems cited included tendency to lose his balance with near falls when having to suddenly dodge or maneuver around something on the ground while walking, sensitivity to loud noise or bright light, intermittent dysfluent speech (especially when feeling anxious or in new situations), problems maintaining sleep at night and numbness in the tips of the fingers of both hands. He denied problems with eye-hand coordination, limb strength, vision, hearing, swallowing, speech, smell or taste. He no longer experiences left shoulder pain since undergoing left shoulder rotator cuff repair surgery. He denied symptoms of emotional distress or problems with behavioral control, including sad mood, apathy, mood instability, loss of pleasure or interests, worthlessness, undue anxiety, poor impulse control, suicidal thoughts, hallucinations and delusions. He did express concern that others might perceive him to be "an idiot" due to his speech dysfluency and difficulty with multi-tasking. Other than preparing to move to [place] within the next few days, he did not report any recent or ongoing life stressors.  He reported that he has been keeping busy by performing housekeeping tasks, getting his home ready to sell, assisting around his daughter's house and playing golf. He has been socializing more over the past year. He reported that he continues to drive without incident.   Tests Administered Tests of attention, processing speed, eye-hand dexterity, cognitive flexibility, memory and problem-solving were administered or re-administered. Evaluation procedures included: Animal Naming Test Controlled Oral Word Association Test  Grooved Pegboard Patient Health Questionnaire  Ruff Figural Fluency Test Stroop Color and  Word Test  Trail Making A & B  Wechsler Adult Intelligence Scale- IV:  Coding & Digit Span Wechsler  Memory Scale-IV: Flexible Battery    [employer] Results The test results were considered to reflect a valid representation of his current cognitive functioning.  He appeared alert, focused and at ease. He seemed to expend maximal effort.  Other than stories used in a memory test, he reported no recollection of any testing stimuli or materials.   His test scores were corrected to reflect norms for his age and, whenever possible, his gender and educational level (i.e., 20+ years).   His raw test scores in parentheses with associated percentile ranks from the two prior and the current test administrations are listed below for comparison:   November 2015       May 2016          June 2017  Animal Naming Test (15)   11th   (15)     11th     (16)    12th     Controlled Oral Word Association Test (33)   11th   (37)     21st    (38)    31st     Grooved Pegboard- Right Hand (101s)   7th  (85s)     14th    (108s)    3rd   Grooved Pegboard- Left Hand (129s)   3rd   (93s)     14th    (123s)    3rd     Ruff Figural Fluency Test -   (87)    51st   (66)   21st     Stroop Color and Word Test       Word (70)     <1st (82)        1st  (83)       2nd  Color (30)     <1st  (58)        1st   (60)       5th   Color-Word (15)     <1st  (20)        1st   (13)     <1st     Trails A ( 40s 0e)   11th  (43s 0e)     11th  ( 30s  0e)     39th   Trails B (165s 2e)    1st  (93s 1e)     14th    (164s 4e)       1st     Wechsler Adult Intelligence Scale- IV             Digit Span Forward (  9)   37th  (11)    63rd   (  9)    37th   Digit Span Backward (11)   84th   (10)    75th    (13)    95th   Digit Span Sequencing (  9)   63rd   (10)    75th    (10)    75th   Coding (45)   16th  (40)    16th   (49)    25th     Wechsler Memory Scale-IV (LMVR)                   Immediate Memory        - (22)    63rd    (28)    96th    Delayed Memory       -   (26)  88th    (28)    95th   Auditory Memory       - (24)    75th           (29)    94th   Visual Memory        - (24)    75th   (96)    88th      [place] Systems developer                      Errors          -   (15)         55th (13)           61st   Perseverative Responses          -    (10)         47th  (  9)           50th     Categories          -   (   6)      >16th (  6)        >16th   Trials to 1st Category           -   (11)       >16th (12)        >16th   Failure to Maintain Set          -    (  0)       >16th (  1)        >16th    Learning to Learn          -    (-2.89)   >16th (  0)        >16th     On the Patient Health Questionnaire, a screening instrument for depression, his score of 7 fell within the mild range. Like last year, he endorsed trouble concentrating, problems staying asleep and having little energy and no symptoms of affective distress.   Summary & Conclusions Nicholas Perry is a 65 year-old man who returns at his request for a repeat neuropsychological evaluation to obtain an updated assessment of his cognitive functioning. He suffered an electrocution injury and a traumatic brain injury in a fall off of a ladder on 12/14/13.   Neuropsychological testing indicated persisting deficits in the areas of mental processing speed, divided and alternating attention, cognitive flexibility and bimanual manipulative dexterity. Observations were notable for mild speech dysfluency that did not impede his functional communication. His memory and conceptual reasoning abilities continue to be well-preserved. Compared to his most recent evaluation in 2016, his immediate memory significantly improved from the Average range to the Superior range though it was unclear how much this had to do with his recollection of the test materials. Conversely, he demonstrated significant slowing of manipulative dexterity speed for both  hands as compared to 2016.    He did not report symptoms of emotional distress nor difficulties with psychosocial adjustment.    In conclusion, his neuropsychological profile has appeared relatively stable over the past eighteen months. He has likely reached maximal neuropsychological improvement now at two years post-injury. He is likely to make incremental cognitive gains from here on out. 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 I reviewed his case with a physical therapist in my clinic who stated that a referral to physical therapy would be indicated.   Feedback to Patient Per his request, the results from this evaluation were discussed with him in a phone call on 01/14/16. He expressed disappointment and surprise that he did not show significant gains.   He was advised to avoid multi-tasking, which is one of his primary cognitive weaknesses. Although his memory functioning was intact on neuropsychological testing, he was again advised to use compensatory strategies to aid memory (e.g., asking for repetition, using reminders, writing information in a notebook) as his registration memory is susceptible to disruption from external distraction, his reduced ability to divide attention and his propensity to fatigue.   He was encouraged to maintain involvement in mentally, physically and socially stimulating activities to the limits of his mental and physical endurance.   He asked about again setting up a repeat neuropsychological evaluation in a year or so. I told him that in my opinion there would be no clinical value in further neuropsychological testing at this point. Nonetheless, I told him that I would be happy to re-evaluate  him if a referral came my way.    The possible benefit of seeing a physical therapist for his high level balance problem was discussed.  Should such a referral be deemed medically appropriate, he could follow-up with a physical therapist in [place].    I have appreciated the opportunity to again evaluate Nicholas Perry.  Please feel free to contact me with any comments or questions.     __________________ Antionette Poles, Ph.D Licensed Psychologist       Copy to: Jill Alexanders, MD   [facility]
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
## 2016-03-19 ENCOUNTER — Telehealth: Payer: Self-pay | Admitting: Cardiology

## 2016-04-05 NOTE — Telephone Encounter (Signed)
Closed Encounter  °

## 2016-04-20 ENCOUNTER — Ambulatory Visit: Payer: BLUE CROSS/BLUE SHIELD | Admitting: Cardiology

## 2016-04-28 ENCOUNTER — Ambulatory Visit: Payer: BLUE CROSS/BLUE SHIELD | Admitting: Neurology

## 2016-05-05 ENCOUNTER — Encounter: Payer: Self-pay | Admitting: Neurology

## 2016-05-05 ENCOUNTER — Ambulatory Visit (INDEPENDENT_AMBULATORY_CARE_PROVIDER_SITE_OTHER): Payer: BLUE CROSS/BLUE SHIELD | Admitting: Neurology

## 2016-05-05 VITALS — BP 105/69 | HR 56 | Ht 73.0 in | Wt 164.5 lb

## 2016-05-05 DIAGNOSIS — F0781 Postconcussional syndrome: Secondary | ICD-10-CM | POA: Diagnosis not present
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
## 2016-05-10 ENCOUNTER — Other Ambulatory Visit: Payer: Self-pay | Admitting: Cardiology

## 2016-05-10 NOTE — Telephone Encounter (Signed)
REFILL 

## 2016-05-27 ENCOUNTER — Telehealth: Payer: Self-pay | Admitting: *Deleted

## 2016-05-27 DIAGNOSIS — Z0289 Encounter for other administrative examinations: Secondary | ICD-10-CM

## 2016-05-27 NOTE — Telephone Encounter (Signed)
Pt [facility] form faxed to [phone] on 05/27/16.

## 2016-05-27 NOTE — Telephone Encounter (Signed)
Pt attending physician form @ the front desk for pick up. Pt need to pay the fee.

## 2016-11-17 ENCOUNTER — Encounter: Payer: Self-pay | Admitting: *Deleted

## 2016-11-17 NOTE — Progress Notes (Signed)
Gave completed/signed physicians progress statement to medical records to process.  Paperwork from [employer] Dated 11/16/16

## 2016-11-19 DIAGNOSIS — Z0289 Encounter for other administrative examinations: Secondary | ICD-10-CM

## 2017-04-25 ENCOUNTER — Encounter: Payer: Self-pay | Admitting: Family Medicine

## 2017-04-25 ENCOUNTER — Ambulatory Visit (INDEPENDENT_AMBULATORY_CARE_PROVIDER_SITE_OTHER): Payer: 59 | Admitting: Family Medicine

## 2017-04-25 VITALS — BP 98/68 | HR 67 | Temp 98.3°F | Ht 74.0 in | Wt 167.0 lb

## 2017-04-25 DIAGNOSIS — F0781 Postconcussional syndrome: Secondary | ICD-10-CM | POA: Diagnosis not present

## 2017-04-25 DIAGNOSIS — R351 Nocturia: Secondary | ICD-10-CM | POA: Diagnosis not present

## 2017-04-25 DIAGNOSIS — R561 Post traumatic seizures: Secondary | ICD-10-CM

## 2017-04-25 DIAGNOSIS — E785 Hyperlipidemia, unspecified: Secondary | ICD-10-CM

## 2017-04-25 DIAGNOSIS — Z23 Encounter for immunization: Secondary | ICD-10-CM

## 2017-04-25 DIAGNOSIS — N401 Enlarged prostate with lower urinary tract symptoms: Secondary | ICD-10-CM

## 2017-04-25 DIAGNOSIS — I10 Essential (primary) hypertension: Secondary | ICD-10-CM

## 2017-04-25 DIAGNOSIS — Z0000 Encounter for general adult medical examination without abnormal findings: Secondary | ICD-10-CM | POA: Diagnosis not present

## 2017-04-25 DIAGNOSIS — I480 Paroxysmal atrial fibrillation: Secondary | ICD-10-CM | POA: Diagnosis not present

## 2017-04-25 LAB — CBC
HCT: 42.2 % (ref 39.0–52.0)
Hemoglobin: 14.1 g/dL (ref 13.0–17.0)
MCHC: 33.3 g/dL (ref 30.0–36.0)
MCV: 96.9 fl (ref 78.0–100.0)
Platelets: 184 10*3/uL (ref 150.0–400.0)
RBC: 4.36 Mil/uL (ref 4.22–5.81)
RDW: 13.9 % (ref 11.5–15.5)
WBC: 4.3 10*3/uL (ref 4.0–10.5)

## 2017-04-25 LAB — COMPREHENSIVE METABOLIC PANEL
ALK PHOS: 45 U/L (ref 39–117)
ALT: 19 U/L (ref 0–53)
AST: 19 U/L (ref 0–37)
Albumin: 4.2 g/dL (ref 3.5–5.2)
BUN: 20 mg/dL (ref 6–23)
CALCIUM: 9.3 mg/dL (ref 8.4–10.5)
CO2: 29 meq/L (ref 19–32)
CREATININE: 1.15 mg/dL (ref 0.40–1.50)
Chloride: 103 mEq/L (ref 96–112)
GFR: 67.85 mL/min (ref >60.00)
Glucose, Bld: 72 mg/dL (ref 70–99)
Potassium: 4.2 mEq/L (ref 3.5–5.1)
Sodium: 140 mEq/L (ref 135–145)
Total Bilirubin: 0.7 mg/dL (ref 0.2–1.2)
Total Protein: 6.7 g/dL (ref 6.0–8.3)

## 2017-04-25 LAB — LIPID PANEL
CHOLESTEROL: 249 mg/dL — AB (ref 0–200)
HDL: 143.3 mg/dL (ref >39.00)
LDL CALC: 91 mg/dL (ref 0–99)
NonHDL: 105.9
TRIGLYCERIDES: 76 mg/dL (ref 0.0–149.0)
Total CHOL/HDL Ratio: 2
VLDL: 15.2 mg/dL (ref 0.0–40.0)

## 2017-04-25 LAB — PSA: PSA: 0.7 ng/mL (ref 0.10–4.00)

## 2017-04-25 MED ORDER — METOPROLOL SUCCINATE ER 25 MG PO TB24: 25.0000 mg | ORAL_TABLET | Freq: Every day | ORAL | 3 refills | Status: DC

## 2017-04-25 MED FILL — METOPROLOL SUCC ER 25 MG TA: 25 | 90 days supply | Qty: 90 | Fill #0

## 2017-04-25 NOTE — Assessment & Plan Note (Signed)
A. Fib. Recurred on cardura. Has had some episodes of a. Fib since that time off the medicine (or at least perceived as having palpitations). 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 change lisinopril to metoprolol for rate control if goes into a fib. Wants to hold off on anticoagulation until talks to cardiology.

## 2017-04-25 NOTE — Progress Notes (Signed)
Phone: [phone]  Subjective:  Patient presents today for their annual physical. Chief complaint-noted.   See problem oriented charting- ROS- full  review of systems was completed and negative except for: occasional palpitations.   The following were reviewed and entered/updated in epic: Past Medical History:  Diagnosis Date  . Hypertension   . Paroxysmal atrial fibrillation (HCC)   . Partial tear of rotator cuff 06/27/2015  . 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 Seizure after head injury ([facility] [facility]) 12/14/2013  . Shoulder pain, left    Patient Active Problem List   Diagnosis Date Noted  . 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 Seizure after head injury ([facility]) 12/14/2013    Priority: High  . Atrial fibrillation ([facility]) 09/22/2009    Priority: High  . BPH (benign prostatic hyperplasia) 07/18/2014    Priority: Medium  . Hyperlipidemia 01/02/2008    Priority: Medium  . Essential hypertension 01/02/2008    Priority: Medium  . Partial tear of rotator cuff 06/27/2015   Past Surgical History:  Procedure Laterality Date  . knee scopes Bilateral   . NASAL SEPTUM SURGERY    . right inguinal hernia     child  . SHOULDER ACROMIOPLASTY Left 06/27/2015   Procedure: SHOULDER ACROMIOPLASTY DEBRIDE LABRUM;  Surgeon: [name], MD;  Location: [facility];  Service: Orthopedics;  Laterality: Left;  . SHOULDER ARTHROSCOPY WITH ROTATOR CUFF REPAIR Left 06/27/2015   Procedure: SHOULDER ARTHROSCOPY WITH ROTATOR CUFF REPAIR;  Surgeon: [name], MD;  Location: [facility];  Service: Orthopedics;  Laterality: Left;  . TONSILECTOMY/ADENOIDECTOMY WITH MYRINGOTOMY     child    Family History  Problem Relation Age of Onset  . Cancer - Other Father        renal cell metastatic  . CAD Father 21  . Hypertension Brother        smoker  . CAD Brother 63       stent, smoker    Medications- reviewed and updated Current Outpatient  Prescriptions  Medication Sig Dispense Refill  . aspirin 81 MG tablet Take 81 mg by mouth daily.    . metoprolol succinate (TOPROL-XL) 25 MG 24 hr tablet Take 1 tablet (25 mg total) by mouth daily. 90 tablet 3   No current facility-administered medications for this visit.     Allergies-reviewed and updated Allergies  Allergen Reactions  . Cardura [Doxazosin Mesylate]     Caused patient to go into a. Fib.  . Simvastatin     REACTION: muscle aches    Social History   Social History  . Marital status: Married    Spouse name: N/A  . Number of children: 3  . Years of education: MD   Occupational History  . N/A    Social History Main Topics  . Smoking status: Never Smoker  . Smokeless tobacco: Never Used  . Alcohol use 4.2 - 8.4 oz/week    7 - 14 Standard drinks or equivalent per week     Comment: glass of wine nightly  . Drug use: No  . Sexual activity: Not Asked   Other Topics Concern  . None   Social History Narrative   Married (2nd marriage in 2010). 3 children from first marriage. 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   Patient drinks about 1 cup of caffeine daily and occasionally has a soda.     Objective: BP 98/68 (BP Location: Left Arm, Patient Position: Sitting, Cuff Size: Large)   Pulse 67   Temp 98.3 F (36.8 C) (Oral)   Ht 6\' 2"  (1.88 m)   Wt 167 lb (75.8 kg)   SpO2 96%   BMI 21.44 kg/m  Gen: NAD, resting comfortably HEENT: Mucous membranes are moist. Oropharynx normal Neck: no thyromegaly CV: RRR no murmurs rubs or gallops Lungs: CTAB no crackles, wheeze, rhonchi Abdomen: soft/nontender/nondistended/normal bowel  sounds. No rebound or guarding.  Ext: no edema Skin: warm, dry Neuro: grossly normal, moves all extremities, PERRLA Rectal: normal tone, diffusely enlarged prostate, no masses or tenderness  Assessment/Plan:  66 y.o. male presenting for annual physical.  Health Maintenance counseling: 1. Anticipatory guidance: Patient counseled regarding regular dental exams q6 months, eye exams - yearly, wearing seatbelts.  2. Risk factor reduction:  Advised patient of need for regular exercise and diet rich and fruits and vegetables to reduce risk of heart attack and stroke. Exercise- more than 150 minutes a week. Diet-reasonably healthy.  Wt Readings from Last 3 Encounters:  04/25/17 167 lb (75.8 kg)  05/05/16 164 lb 8 oz (74.6 kg)  12/01/15 174 lb (78.9 kg)  3. Immunizations/screenings/ancillary studies- flu shot advised and given Immunization History  Administered Date(s) Administered  . Influenza Split 04/15/2016  . Influenza Whole 04/11/2009  . Influenza,inj,Quad PF,6+ Mos 04/25/2017  . Influenza-Unspecified 04/23/2014  . Td 06/09/2009  . Tdap 12/07/2013  . Zoster 07/18/2014   4. Prostate cancer screening-  low risk rectal exam, PSA trend low risk. Some BPH Lab Results  Component Value Date   PSA 0.70 04/25/2017   PSA 0.67 12/01/2015   PSA 0.68 11/07/2014   5. Colon cancer screening -  09/24/14 with 5 year follow up (history adenoma) 6. Skin cancer screening- may get plugged back in- advised regular sunscreen use. Denies worrisome, changing, or new skin lesions.   Status of chronic or acute concerns   No recurrent seizures  Post concussive encephalopathy- persists and still not practicing medicine  HLD-  Superb HDL in past- no statin has been started. New info on HDL discussed- potentially not as protective as once thought at his high levels.   BPH - stable on exam. 0-1x a night nocturia.   Essential hypertension HTN- controlled on lisinopril 10mg . With a fib he prefers beta  blocker and that's reasonable- change to metoprolol XR 25mg   Atrial fibrillation A. Fib. Recurred on cardura. Has had some episodes of a. Fib since that time off the medicine (or at least perceived as having palpitations). 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 change lisinopril to metoprolol for rate control if goes into a fib. Wants to hold off on anticoagulation until talks to cardiology.    1 year CPE. Offered 6 months- he declines. Will do home monitoring on BP and let me know- goals per AVS  Orders Placed This Encounter  Procedures  . Flu Vaccine QUAD 36+ mos IM  . CBC    Standing Status:   Future    Number of Occurrences:   1    Standing Expiration Date:   04/25/2018  .  Comprehensive metabolic panel    [place]    Standing Status:   Future    Number of Occurrences:   1    Standing Expiration Date:   04/25/2018  . Lipid panel    Standing Status:   Future    Number of Occurrences:   1    Standing Expiration Date:   04/25/2018  . PSA    Standing Status:   Future    Number of Occurrences:   1    Standing Expiration Date:   04/25/2018    Meds ordered this encounter  Medications  . metoprolol succinate (TOPROL-XL) 25 MG 24 hr tablet    Sig: Take 1 tablet (25 mg total) by mouth daily.    Dispense:  90 tablet    Refill:  3   Return precautions advised.  Garret Reddish, MD

## 2017-04-25 NOTE — Patient Instructions (Addendum)
Blood pressure goal <140/90 and HR goal > 55. Give me an update in a few weeks with how your numbers are doing. Stop lisinopril. Start metoprolol.   Schedule with Dr. Percival Spanish- need to consider xarelto or coumadin or similar especially as you turn 66 soon  Flu shot today  Please stop by lab before you go

## 2017-04-25 NOTE — Assessment & Plan Note (Signed)
HTN- controlled on lisinopril 10mg . With a fib he prefers beta blocker and that's reasonable- change to metoprolol XR 25mg 
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
## 2017-04-28 ENCOUNTER — Ambulatory Visit (INDEPENDENT_AMBULATORY_CARE_PROVIDER_SITE_OTHER): Payer: 59 | Admitting: Cardiology

## 2017-04-28 ENCOUNTER — Encounter: Payer: Self-pay | Admitting: Cardiology

## 2017-04-28 VITALS — BP 102/66 | HR 54 | Ht 74.0 in | Wt 168.0 lb

## 2017-04-28 DIAGNOSIS — I340 Nonrheumatic mitral (valve) insufficiency: Secondary | ICD-10-CM

## 2017-04-28 DIAGNOSIS — I10 Essential (primary) hypertension: Secondary | ICD-10-CM

## 2017-04-28 DIAGNOSIS — I480 Paroxysmal atrial fibrillation: Secondary | ICD-10-CM | POA: Diagnosis not present

## 2017-04-28 DIAGNOSIS — R931 Abnormal findings on diagnostic imaging of heart and coronary circulation: Secondary | ICD-10-CM

## 2017-04-28 MED ORDER — RIVAROXABAN 20 MG PO TABS
20.0000 mg | ORAL_TABLET | Freq: Every day | ORAL | 3 refills | Status: DC
Start: 2017-04-28 — End: 2018-04-18

## 2017-04-28 MED FILL — XARELTO 20 MG TABLET: 20 | 90 days supply | Qty: 90 | Fill #0

## 2017-04-28 NOTE — Patient Instructions (Signed)
Medication Instructions:  STOP- Aspirin 81 mg START- Xarelto 20 mg daily  If you need a refill on your cardiac medications before your next appointment, please call your pharmacy.  Labwork: None Ordered   Testing/Procedures: None Ordered  Follow-Up: Your physician wants you to follow-up in: 1 Year. You should receive a reminder letter in the mail two months in advance. If you do not receive a letter, please call our office [phone].    Thank you for choosing [facility] at [facility]!!

## 2017-07-19 MED FILL — METOPROLOL SUCC ER 25 MG TA: 25 | 90 days supply | Qty: 90 | Fill #1

## 2017-07-19 MED FILL — XARELTO 20 MG TABLET: 20 | 90 days supply | Qty: 90 | Fill #1

## 2017-10-18 MED FILL — METOPROLOL SUCCINATE ER 25: 25 | 90 days supply | Qty: 90 | Fill #2

## 2017-10-18 MED FILL — XARELTO 20 MG TABLET: 20 | 90 days supply | Qty: 90 | Fill #2

## 2018-01-17 MED FILL — METOPROLOL SUCCINATE ER 25: 25 | 90 days supply | Qty: 90 | Fill #3

## 2018-01-17 MED FILL — XARELTO 20 MG TABLET: 20 | 90 days supply | Qty: 90 | Fill #3

## 2018-02-14 ENCOUNTER — Encounter: Payer: Self-pay | Admitting: Cardiology
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
## 2018-04-18 ENCOUNTER — Encounter: Payer: Self-pay | Admitting: Cardiology

## 2018-04-18 ENCOUNTER — Ambulatory Visit (INDEPENDENT_AMBULATORY_CARE_PROVIDER_SITE_OTHER): Payer: 59 | Admitting: Cardiology

## 2018-04-18 VITALS — BP 118/75 | HR 53 | Ht 74.0 in | Wt 172.0 lb

## 2018-04-18 DIAGNOSIS — I10 Essential (primary) hypertension: Secondary | ICD-10-CM

## 2018-04-18 DIAGNOSIS — Z79899 Other long term (current) drug therapy: Secondary | ICD-10-CM | POA: Diagnosis not present

## 2018-04-18 DIAGNOSIS — I480 Paroxysmal atrial fibrillation: Secondary | ICD-10-CM | POA: Diagnosis not present

## 2018-04-18 DIAGNOSIS — E785 Hyperlipidemia, unspecified: Secondary | ICD-10-CM

## 2018-04-18 DIAGNOSIS — R931 Abnormal findings on diagnostic imaging of heart and coronary circulation: Secondary | ICD-10-CM

## 2018-04-18 MED ORDER — RIVAROXABAN 20 MG PO TABS: 20.0000 mg | ORAL_TABLET | Freq: Every day | ORAL | 3 refills | Status: DC

## 2018-04-18 MED ORDER — DILTIAZEM HCL 30 MG PO TABS: 30.0000 mg | ORAL_TABLET | Freq: Two times a day (BID) | ORAL | 3 refills | Status: DC

## 2018-04-18 MED ORDER — FLECAINIDE ACETATE 100 MG PO TABS: 100.0000 mg | ORAL_TABLET | Freq: Two times a day (BID) | ORAL | 3 refills | Status: DC

## 2018-04-18 MED FILL — dilTIAZem HCL 30 MG TABS: 30 | 90 days supply | Qty: 180 | Fill #0

## 2018-04-18 MED FILL — FLECAINIDE ACETATE 100 MG T: 100 | 90 days supply | Qty: 180 | Fill #0

## 2018-04-18 MED FILL — XARELTO 20 MG TABLET: 20 | 90 days supply | Qty: 90 | Fill #0

## 2018-04-18 NOTE — Patient Instructions (Signed)
Medication Instructions:  STOP- Metoprolol START- Cardizem 30 mg twice a day START- Flecainide 100 mg take 50 mg(1/2 tablet) twice a day for 7 days then increase to 100 mg (1 tablet) twice a day  If you need a refill on your cardiac medications before your next appointment, please call your pharmacy.  Labwork: Flecainide Level in 2 Months HERE IN OUR OFFICE AT [facility]  Take the provided lab slips with you to the lab for your blood draw.   You will NOT need to fast   Testing/Procedures: Your physician has requested that you have an exercise tolerance test. For further information please visit [email or web address]. Please also follow instruction sheet, as given.   Follow-Up: Your physician wants you to follow-up in: 3 Months with Dr Percival Spanish.       Thank you for choosing [facility] at [facility]!!

## 2018-04-20 NOTE — Addendum Note (Signed)
Addended by: Zebedee Iba on: 04/20/2018 02:06 PM   Modules accepted: Orders

## 2018-05-04 ENCOUNTER — Encounter: Payer: Self-pay | Admitting: Family Medicine

## 2018-05-04 ENCOUNTER — Ambulatory Visit (INDEPENDENT_AMBULATORY_CARE_PROVIDER_SITE_OTHER): Payer: 59 | Admitting: Family Medicine

## 2018-05-04 VITALS — BP 108/72 | HR 54 | Temp 98.5°F | Ht 74.0 in | Wt 172.0 lb

## 2018-05-04 DIAGNOSIS — Z23 Encounter for immunization: Secondary | ICD-10-CM

## 2018-05-04 DIAGNOSIS — Z0000 Encounter for general adult medical examination without abnormal findings: Secondary | ICD-10-CM

## 2018-05-04 DIAGNOSIS — F0781 Postconcussional syndrome: Secondary | ICD-10-CM | POA: Diagnosis not present

## 2018-05-04 DIAGNOSIS — E785 Hyperlipidemia, unspecified: Secondary | ICD-10-CM | POA: Diagnosis not present

## 2018-05-04 DIAGNOSIS — I480 Paroxysmal atrial fibrillation: Secondary | ICD-10-CM

## 2018-05-04 DIAGNOSIS — Z125 Encounter for screening for malignant neoplasm of prostate: Secondary | ICD-10-CM

## 2018-05-04 DIAGNOSIS — N401 Enlarged prostate with lower urinary tract symptoms: Secondary | ICD-10-CM | POA: Diagnosis not present

## 2018-05-04 DIAGNOSIS — I10 Essential (primary) hypertension: Secondary | ICD-10-CM

## 2018-05-04 DIAGNOSIS — R351 Nocturia: Secondary | ICD-10-CM | POA: Diagnosis not present

## 2018-05-04 LAB — COMPREHENSIVE METABOLIC PANEL
ALBUMIN: 4.4 g/dL (ref 3.5–5.2)
ALT: 27 U/L (ref 0–53)
AST: 24 U/L (ref 0–37)
Alkaline Phosphatase: 50 U/L (ref 39–117)
BUN: 22 mg/dL (ref 6–23)
CHLORIDE: 103 meq/L (ref 96–112)
CO2: 30 mEq/L (ref 19–32)
CREATININE: 1.29 mg/dL (ref 0.40–1.50)
Calcium: 9.6 mg/dL (ref 8.4–10.5)
GFR: 59.24 mL/min — ABNORMAL LOW (ref >60.00)
Glucose, Bld: 84 mg/dL (ref 70–99)
Potassium: 4.5 mEq/L (ref 3.5–5.1)
Sodium: 140 mEq/L (ref 135–145)
Total Bilirubin: 0.7 mg/dL (ref 0.2–1.2)
Total Protein: 7.1 g/dL (ref 6.0–8.3)

## 2018-05-04 LAB — CBC
HEMATOCRIT: 41.9 % (ref 39.0–52.0)
Hemoglobin: 14.1 g/dL (ref 13.0–17.0)
MCHC: 33.7 g/dL (ref 30.0–36.0)
MCV: 93.7 fl (ref 78.0–100.0)
PLATELETS: 181 10*3/uL (ref 150.0–400.0)
RBC: 4.47 Mil/uL (ref 4.22–5.81)
RDW: 14 % (ref 11.5–15.5)
WBC: 4.2 10*3/uL (ref 4.0–10.5)

## 2018-05-04 LAB — LIPID PANEL
CHOLESTEROL: 269 mg/dL — AB (ref 0–200)
HDL: 120.2 mg/dL (ref >39.00)
LDL CALC: 135 mg/dL — AB (ref 0–99)
NonHDL: 148.33
TRIGLYCERIDES: 66 mg/dL (ref 0.0–149.0)
Total CHOL/HDL Ratio: 2
VLDL: 13.2 mg/dL (ref 0.0–40.0)

## 2018-05-04 LAB — PSA: PSA: 0.57 ng/mL (ref 0.10–4.00)

## 2018-05-04 NOTE — Patient Instructions (Addendum)
Health Maintenance Due  Topic Date Due  . PNA vac Low Risk Adult (1 of 2 - PCV13). today 05/27/2017  . INFLUENZA VACCINE. today 03/09/2018   Consider signing up for nurse visit for shingrix in a month or so then after that another 2-5 months before getting second shot  Please stop by lab before you go

## 2018-05-04 NOTE — Progress Notes (Signed)
Phone: [phone]  Subjective:  Patient presents today for their annual physical. Chief complaint-noted.   See problem oriented charting- ROS- full  review of systems was completed and negative except for: inability to concentrate, palpitations  The following were reviewed and entered/updated in epic: Past Medical History:  Diagnosis Date  . Hypertension   . Paroxysmal atrial fibrillation (HCC)   . Partial tear of rotator cuff 06/27/2015  . 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 Seizure after head injury ([place]) 12/14/2013  . Shoulder pain, left    Patient Active Problem List   Diagnosis Date Noted  . 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 Seizure after head injury ([place]) 12/14/2013    Priority: High  . Atrial fibrillation ([place]) 09/22/2009    Priority: High  . BPH (benign prostatic hyperplasia) 07/18/2014    Priority: Medium  . Hyperlipidemia 01/02/2008    Priority: Medium  . Essential hypertension 01/02/2008    Priority: Medium  . Elevated coronary artery calcium score 04/28/2017  . Partial tear of rotator cuff 06/27/2015   Past Surgical History:  Procedure Laterality Date  . knee scopes Bilateral   . NASAL SEPTUM SURGERY    . right inguinal hernia     child  . SHOULDER ACROMIOPLASTY Left 06/27/2015   Procedure: SHOULDER ACROMIOPLASTY DEBRIDE LABRUM;  Surgeon: [name], MD;  Location: [place];  Service: Orthopedics;  Laterality: Left;  . SHOULDER ARTHROSCOPY WITH ROTATOR CUFF REPAIR Left 06/27/2015   Procedure: SHOULDER ARTHROSCOPY WITH ROTATOR CUFF REPAIR;  Surgeon: [name], MD;  Location: [place];  Service: Orthopedics;  Laterality: Left;  . TONSILECTOMY/ADENOIDECTOMY WITH MYRINGOTOMY     child    Family History  Problem Relation Age of Onset  . Cancer - Other Father        renal cell metastatic  . CAD Father 29  . Hypertension Brother        smoker  . CAD Brother 20       stent,  smoker    Medications- reviewed and updated Current Outpatient Medications  Medication Sig Dispense Refill  . diltiazem (CARDIZEM) 30 MG tablet Take 1 tablet (30 mg total) by mouth 2 (two) times daily. 180 tablet 3  . flecainide (TAMBOCOR) 100 MG tablet Take 1 tablet (100 mg total) by mouth 2 (two) times daily. 180 tablet 3  . rivaroxaban (XARELTO) 20 MG TABS tablet Take 1 tablet (20 mg total) by mouth daily with supper. 90 tablet 3   No current facility-administered medications for this visit.     Allergies-reviewed and updated Allergies  Allergen Reactions  . Cardura [Doxazosin Mesylate]     Caused patient to go into a. Fib.  . Simvastatin     REACTION: muscle aches    Social History   Social History Narrative   Married (2nd marriage in 2010). 3 children from first marriage. 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   Patient drinks about 1 cup of caffeine daily and occasionally has a soda.     Objective: BP 108/72 (BP Location: Left Arm, Patient Position: Sitting, Cuff  Size: Large)   Pulse (!) 54   Temp 98.5 F (36.9 C) (Oral)   Ht 6\' 2"  (1.88 m)   Wt 172 lb (78 kg)   SpO2 99%   BMI 22.08 kg/m  Gen: NAD, resting comfortably HEENT: Mucous membranes are moist. Oropharynx normal Neck: no thyromegaly CV: RRR no murmurs rubs or gallops Lungs: CTAB no crackles, wheeze, rhonchi Abdomen: soft/nontender/nondistended/normal bowel sounds. No rebound or guarding.  Ext: no edema Skin: warm, dry Neuro: grossly normal, moves all extremities, PERRLA  Rectal deferred due to excellent prior PSA  Assessment/Plan:  67 y.o. male presenting for annual  physical.  Health Maintenance counseling: 1. Anticipatory guidance: Patient counseled regarding regular dental exams -q6 months, eye exams -yearly, wearing seatbelts.  2. Risk factor reduction:  Advised patient of need for regular exercise and diet rich and fruits and vegetables to reduce risk of heart attack and stroke. Exercise- doing some weight sabout 3 days a week, walks dog regularly- vigorous walks for an hour. Diet-reasonably healthy diet- states cant eat any better honestly.  Wt Readings from Last 3 Encounters:  05/04/18 172 lb (78 kg)  04/18/18 172 lb (78 kg)  04/28/17 168 lb (76.2 kg)  3. Immunizations/screenings/ancillary studies- flu and prevnar 13 today. Shingrix in a month  Immunization History  Administered Date(s) Administered  . Influenza Split 04/15/2016  . Influenza Whole 04/11/2009  . Influenza, High Dose Seasonal PF 05/04/2018  . Influenza,inj,Quad PF,6+ Mos 04/25/2017  . Influenza-Unspecified 04/23/2014  . Td 06/09/2009  . Tdap 12/07/2013  . Zoster 07/18/2014  4. Prostate cancer screening- will trend PSA. Some BPH on exam in past with 1x a night nocturia- drinking more in evening. Skip rectal today as long as PSA remains low/not trending up Lab Results  Component Value Date   PSA 0.70 04/25/2017   PSA 0.67 12/01/2015   PSA 0.68 11/07/2014   5. Colon cancer screening - 09/24/14 with 5 year repeat  Due to adenoma history 6. Skin cancer screening- no dermatologist- has had some AKs in the past and has used fluorouracil and hasnt had recurrence. advised regular sunscreen use. Denies worrisome, changing, or new skin lesions.   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 Explains it as if developed a plan but then deviates from plan and it has to reset (like taking an early turn on google maps and having reset map)  HLD-  Superb HDL in past- no statin has been started. Last  year LDL was under 100- update lipid panel  HTN- controlled on lisinopril 10mg  last year- With a fib he prefers beta blocker and that's reasonable- change to metoprolol XR 25mg  last year- now on diltiazem 30mg  BID and controlled  A. Fib. Recurred on cardura and then later off this. He is now on flecainide, diltiazem for rate control and xarelto for anticoagulation. hasnt seen much improvement yet on this regimen as far as # episodes of palpitations- but duration is shorter.   No problem-specific Assessment & Plan notes found for this encounter.   Future Appointments  Date Time Provider [place]  06/21/2018  9:45 AM [facility]  07/24/2018  9:00 AM Minus Breeding, MD [facility]   No follow-ups on file.  Lab/Order associations: Need for prophylactic vaccination and inoculation against influenza - Plan: Flu vaccine HIGH DOSE PF  Need for prophylactic vaccination against Streptococcus pneumoniae (pneumococcus) - Plan: Pneumococcal conjugate vaccine  13-valent IM  No orders of the defined types were placed in this encounter.   Return precautions advised.  Garret Reddish, MD

## 2018-05-31 ENCOUNTER — Ambulatory Visit (INDEPENDENT_AMBULATORY_CARE_PROVIDER_SITE_OTHER): Payer: 59 | Admitting: *Deleted

## 2018-05-31 DIAGNOSIS — Z23 Encounter for immunization: Secondary | ICD-10-CM

## 2018-05-31 NOTE — Progress Notes (Signed)
Per orders of Dr. Yong Channel, injection of Shingrix given by Williemae Area. Patient tolerated injection well.

## 2018-06-16 ENCOUNTER — Telehealth (HOSPITAL_COMMUNITY): Payer: Self-pay

## 2018-06-16 NOTE — Telephone Encounter (Signed)
Encounter complete. 

## 2018-06-21 ENCOUNTER — Ambulatory Visit (HOSPITAL_COMMUNITY)
Admission: RE | Admit: 2018-06-21 | Discharge: 2018-06-21 | Disposition: A | Discharge status: Home / Self Care | Payer: 59 | Source: Ambulatory Visit | Attending: Cardiology | Admitting: Cardiology

## 2018-06-21 DIAGNOSIS — Z79899 Other long term (current) drug therapy: Secondary | ICD-10-CM

## 2018-06-21 LAB — EXERCISE TOLERANCE TEST
CHL CUP MPHR: 155 {beats}/min
CSEPED: 12 min
CSEPEDS: 37 s
CSEPHR: 97 %
Estimated workload: 14.5 METS
Peak HR: 150 {beats}/min
RPE: 17
Rest HR: 53 {beats}/min

## 2018-06-23 LAB — FLECAINIDE LEVEL: Flecainide: 0.37 ug/mL (ref 0.20–1.00)

## 2018-07-10 MED FILL — dilTIAZem HCL 30 MG TABS: 30 | 90 days supply | Qty: 180 | Fill #1

## 2018-07-10 MED FILL — XARELTO 20 MG TABLET: 20 | 90 days supply | Qty: 90 | Fill #1

## 2018-07-10 MED FILL — FLECAINIDE ACETATE 100 MG T: 100 | 90 days supply | Qty: 180 | Fill #1
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
## 2018-07-24 ENCOUNTER — Ambulatory Visit (INDEPENDENT_AMBULATORY_CARE_PROVIDER_SITE_OTHER): Payer: 59 | Admitting: Cardiology

## 2018-07-24 ENCOUNTER — Encounter: Payer: Self-pay | Admitting: Cardiology

## 2018-07-24 VITALS — BP 116/74 | HR 58 | Ht 73.0 in | Wt 176.2 lb

## 2018-07-24 DIAGNOSIS — E785 Hyperlipidemia, unspecified: Secondary | ICD-10-CM | POA: Diagnosis not present

## 2018-07-24 DIAGNOSIS — I10 Essential (primary) hypertension: Secondary | ICD-10-CM | POA: Diagnosis not present

## 2018-07-24 DIAGNOSIS — R931 Abnormal findings on diagnostic imaging of heart and coronary circulation: Secondary | ICD-10-CM | POA: Diagnosis not present

## 2018-07-24 DIAGNOSIS — R5383 Other fatigue: Secondary | ICD-10-CM | POA: Diagnosis not present

## 2018-07-24 DIAGNOSIS — I480 Paroxysmal atrial fibrillation: Secondary | ICD-10-CM

## 2018-07-24 LAB — MAGNESIUM: Magnesium: 2 mg/dL (ref 1.6–2.3)

## 2018-07-24 LAB — TSH: TSH: 0.697 u[IU]/mL (ref 0.450–4.500)

## 2018-07-24 MED ORDER — METOPROLOL TARTRATE 25 MG PO TABS: 12.5000 mg | ORAL_TABLET | Freq: Two times a day (BID) | ORAL | 3 refills | Status: DC

## 2018-07-24 MED FILL — METOPROLOL TARTRATE 25 MG T: 25 | 90 days supply | Qty: 90 | Fill #0

## 2018-07-24 NOTE — Patient Instructions (Addendum)
Medication Instructions:  STOP- Flecainide START- Metoprolol 12.5 mg twice a day   If you need a refill on your cardiac medications before your next appointment, please call your pharmacy.  Labwork: TSH and Magnesium Today HERE IN OUR OFFICE AT [facility] Take the provided lab slips with you to the lab for your blood draw.   You will NOT need to fast   If you have labs (blood work) drawn today and your tests are completely normal, you will receive your results only by [place] (if you have MyChart) -OR- A paper copy in the mail.  If you have any lab test that is abnormal or we need to change your treatment, we will call you to review these results.  Testing/Procedures: None Ordered  Special Instructions: ALIVECOR  Follow-Up: . Your physician recommends that you schedule a follow-up appointment in: With Dr Rayann Heman . Your physician recommends that you schedule a follow-up appointment in: 6 Months with Dr Percival Spanish   At [facility], you and your health needs are our priority.  As part of our continuing mission to provide you with exceptional heart care, we have created designated Provider Care Teams.  These Care Teams include your primary Cardiologist (physician) and Advanced Practice Providers (APPs -  Physician Assistants and Nurse Practitioners) who all work together to provide you with the care you need, when you need it.

## 2018-07-31 ENCOUNTER — Ambulatory Visit: Payer: 59 | Admitting: Internal Medicine

## 2018-07-31 ENCOUNTER — Encounter: Payer: Self-pay | Admitting: Internal Medicine

## 2018-07-31 VITALS — BP 130/72 | HR 47 | Ht 73.0 in | Wt 176.0 lb

## 2018-07-31 DIAGNOSIS — I480 Paroxysmal atrial fibrillation: Secondary | ICD-10-CM | POA: Diagnosis not present

## 2018-07-31 NOTE — Progress Notes (Signed)
Electrophysiology Office Note Date: 07/31/2018  ID:  Cashtyn Pouliot, DOB [birth year], MRN [id number]  PCP: [name], MD Primary Cardiologist: Dr Percival Spanish Electrophysiologist: Dr Rayann Heman  CC: Consult for paroxysmal atrial fibrillation  Nicholas Perry is a 67 y.o. male seen today at the request of Dr Percival Spanish for evaluation of paroxysmal atrial fibrillation. He was first diagnosed in 2016. He was initially treated with pill-in-pocket BB and anticoagulation for CHADS2VASC score of 2 but continued to have symptomatic episodes and he was started on flecainide. When in afib, he feels lightheaded and has palpitations.  He reports that his AF burden was much higher with flecainide.  He is doing a little better since stopping this medicine.  He has reduced ETOH use. He has found that drinking cold water will cause afib.  Stress and other vagal triggers are also noted.  He also thinks that he has had atrial flutter due to regular palpitations.   Denies snoring or daytime somnolence.  He did have some recent chest and jaw pain as well as SOB during AF with RVR.  He/she denies dyspnea, PND, orthopnea, nausea, vomiting, dizziness, syncope, edema, weight gain, or early satiety.  Past Medical History:  Diagnosis Date  . Hypertension   . Paroxysmal atrial fibrillation (HCC)   . Partial tear of rotator cuff 06/27/2015  . 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 Seizure after head injury ([place]) 12/14/2013  . Shoulder pain, left    Past Surgical History:  Procedure Laterality Date  . knee scopes Bilateral   . NASAL SEPTUM SURGERY    . right inguinal hernia     child  . SHOULDER ACROMIOPLASTY Left 06/27/2015   Procedure: SHOULDER ACROMIOPLASTY DEBRIDE LABRUM;  Surgeon: [name], MD;  Location: [facility];  Service: Orthopedics;  Laterality: Left;  . SHOULDER ARTHROSCOPY WITH ROTATOR CUFF REPAIR Left 06/27/2015   Procedure: SHOULDER ARTHROSCOPY WITH ROTATOR CUFF REPAIR;  Surgeon:  [name], MD;  Location: [facility];  Service: Orthopedics;  Laterality: Left;  . TONSILECTOMY/ADENOIDECTOMY WITH MYRINGOTOMY     child    Current Outpatient Medications  Medication Sig Dispense Refill  . diltiazem (CARDIZEM) 30 MG tablet Take 1 tablet (30 mg total) by mouth 2 (two) times daily. 180 tablet 3  . metoprolol tartrate (LOPRESSOR) 25 MG tablet Take 0.5 tablets (12.5 mg total) by mouth 2 (two) times daily. 90 tablet 3  . rivaroxaban (XARELTO) 20 MG TABS tablet Take 1 tablet (20 mg total) by mouth daily with supper. 90 tablet 3   No current facility-administered medications for this visit.     Allergies:   Cardura [doxazosin mesylate] and Simvastatin   Social History: Social History   Socioeconomic History  . Marital status: Married    Spouse name: Not on file  . Number of children: 3  . Years of education: MD  . Highest education level: Not on file  Occupational History  . Occupation: N/A  Social Needs  . Financial resource strain: Not on file  . Food insecurity:    Worry: Not on file    Inability: Not on file  . Transportation needs:    Medical: Not on file    Non-medical: Not on file  Tobacco Use  . Smoking status: Never Smoker  . Smokeless tobacco: Never Used  Substance and Sexual Activity  . Alcohol use: Yes    Alcohol/week: 7.0 - 14.0 standard drinks    Types: 7 - 14 Standard drinks or equivalent per  week    Comment: glass of wine nightly  . Drug use: No  . Sexual activity: Not on file  Lifestyle  . Physical activity:    Days per week: Not on file    Minutes per session: Not on file  . Stress: Not on file  Relationships  . Social connections:    Talks on phone: Not on file    Gets together: Not on file    Attends religious service: Not on file    Active member of club or organization: Not on file    Attends meetings of clubs or organizations: Not on file    Relationship status: Not on file  . Intimate partner violence:     Fear of current or ex partner: Not on file    Emotionally abused: Not on file    Physically abused: Not on file    Forced sexual activity: Not on file  Other Topics Concern  . Not on file  Social History Narrative   Married (2nd marriage in 2010). 3 children from first marriage. 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   Patient drinks about 1 cup of caffeine daily and occasionally has a soda.     Family History: Family History  Problem Relation Age of Onset  . Cancer - Other Father        renal cell metastatic  . CAD Father 44  . Hypertension Brother        smoker  . CAD Brother 40       stent, smoker    Review of Systems: All other systems reviewed and are otherwise negative except as noted above.   Physical Exam: VS:  There were no vitals taken for this visit. , BMI There is no height or weight on file to calculate BMI. Wt Readings from Last 3 Encounters:  07/24/18 176 lb 3.2 oz (79.9 kg)  05/04/18 172 lb (78 kg)  04/18/18 172 lb (78 kg)    GEN- The patient is well appearing, alert and oriented x 3 today.   HEENT: normocephalic, atraumatic; sclera clear, conjunctiva pink; hearing intact; oropharynx clear; neck supple, no JVP Lymph- no cervical lymphadenopathy Lungs- Clear to ausculation bilaterally, normal work of breathing.  No wheezes, rales, rhonchi Heart- Regular rate and rhythm, no murmurs, rubs or gallops, PMI not laterally displaced GI- soft, non-tender, non-distended, bowel sounds present, no hepatosplenomegaly Extremities- no clubbing, cyanosis, or  edema; DP/PT/radial pulses 2+ bilaterally MS- no significant deformity or atrophy Skin- warm and dry, no rash or lesion  Psych- euthymic mood, full affect Neuro- strength and sensation are intact   EKG:  EKG is ordered today. The ekg ordered today shows sinus bradycardia  Recent Labs: 05/04/2018: ALT 27; BUN 22; Creatinine, Ser 1.29; Hemoglobin 14.1; Platelets 181.0; Potassium 4.5; Sodium 140 07/24/2018: Magnesium 2.0; TSH 0.697    Other studies Reviewed: Additional studies/ records that were reviewed today include: Dr Percival Spanish notes Echo 07/2015 - Left ventricle: The cavity size was normal. Systolic function was  normal. The estimated ejection fraction was in the range of 55%   to 60%. Wall motion was normal; there were no regional wall   motion abnormalities. Doppler parameters are consistent with   abnormal left ventricular relaxation (grade 1 diastolic   dysfunction). - Aortic valve: There was mild regurgitation. - Mitral valve: Mild, late systolicprolapse, involving the anterior   leaflet and the posterior leaflet. - Left atrium: The atrium was mildly dilated. (4mm) - Right ventricle: The cavity size was moderately dilated. Wall   thickness was normal.  ETT 06/21/18  Blood pressure demonstrated a normal response to exercise.  There was no ST segment deviation noted during stress.  No T wave inversion was noted during stress.  Excellent exercise capacity.  Negative, adequate stress test.  Assessment and Plan:  1. Paroxysmal atrial fibrillation/ atrial flutter The patient has symptomatic, recurrent paroxysmal atrial fibrillation.  He also thinks that he may have had atrial flutter he has failed medical therapy with flecainde. Chads2vasc score is 2.  he is anticoagulated with xarelto . Therapeutic strategies for afib/ atrial flutter including medicine and ablation were discussed in detail with the patient today. Risk, benefits, and alternatives to EP study and  radiofrequency ablation for afib were also discussed in detail today. These risks include but are not limited to stroke, bleeding, vascular damage, tamponade, perforation, damage to the esophagus, lungs, and other structures, pulmonary vein stenosis, worsening renal function, and death. The patient understands these risk and wishes to proceed.  We will therefore proceed with catheter ablation at the next available time.  Carto, ICE, anesthesia are requested for the procedure.  Will also obtain cardiac CT prior to the procedure to exclude LAA thrombus and further evaluate atrial anatomy.  Obtain echo to further evaluate for structural heart disease.  Given bradycardia, will also consider SVC/RA junction ablation.   2. HTN Stable No change required today  3. CP/SOB He did have some recent chest and jaw pain as well as SOB during AF with RVR. We will try to obtain cardiac CT FFR at time of LAA assessment CT (above) to evaluate for CAD.   Current medicines are reviewed at length with the patient today.   The patient does not have concerns regarding his medicines.  The following changes were made today:  none   Army Fossa MD 07/31/2018 12:45 PM   [facility] [place] [address] [phone] (office) [phone] (fax)

## 2018-07-31 NOTE — Patient Instructions (Addendum)
Medication Instructions:  Your physician recommends that you continue on your current medications as directed. Please refer to the Current Medication list given to you today.  Labwork: You will get lab work within 30 days of your procedure:  BMP and CBC. Please schedule lab work within 30 days of September 07, 2018  Testing/Procedures: Your physician has requested that you have cardiac CT. Cardiac computed tomography (CT) is a painless test that uses an x-ray machine to take clear, detailed pictures of your heart. For further information please visit [facility]. Please follow instruction sheet as given. You will get a call from our office to schedule the date for this test.  Your physician has recommended that you have an ablation. Catheter ablation is a medical procedure used to treat some cardiac arrhythmias (irregular heartbeats). During catheter ablation, a long, thin, flexible tube is put into a blood vessel in your groin (upper thigh), or neck. This tube is called an ablation catheter. It is then guided to your heart through the blood vessel. Radio frequency waves destroy small areas of heart tissue where abnormal heartbeats may cause an arrhythmia to start. Please see the instruction sheet given to you today.  Your physician has requested that you have an echocardiogram. Echocardiography is a painless test that uses sound waves to create images of your heart. It provides your doctor with information about the size and shape of your heart and how well your heart's chambers and valves are working. This procedure takes approximately one hour. There are no restrictions for this procedure.  Please schedule for an ECHO  Follow-Up: You will follow up with Nicholas Palau, Nicholas Perry with the Atrial fibrillation (Afib) clinic 4 weeks after your ablation.  You will follow up with Nicholas Perry 3 months after your procedure.   CARDIAC CT INSTRUCTIONS:  Please arrive at the [facility] main entrance  of [facility] at xx:xx AM (30-45 minutes prior to test start time)  [facility] [place], [place] [address] [phone]  Proceed to the [facility] Radiology Department (First Floor).  Please follow these instructions carefully (unless otherwise directed):  On the Night Before the Test: . Be sure to Drink plenty of water. . Do not consume any caffeinated/decaffeinated beverages or chocolate 12 hours prior to your test. . Do not take any antihistamines 12 hours prior to your test.  On the Day of the Test: . Drink plenty of water. Do not drink any water within one hour of the test. . Do not eat any food 4 hours prior to the test. . You may take your regular medications prior to the test.  . Take metoprolol (Lopressor) two hours prior to test.                 -IF HR is greater than 55 BPM and patient is less than or equal to 22 yrs old Lopressor 100mg  x1.                  After the Test: . Drink plenty of water. . After receiving IV contrast, you may experience a mild flushed feeling. This is normal. . On occasion, you may experience a mild rash up to 24 hours after the test. This is not dangerous. If this occurs, you can take Benadryl 25 mg and increase your fluid intake. . If you experience trouble breathing, this can be serious. If it is severe call 911 IMMEDIATELY. If it is mild, please call our office.  ABLATION INSTRUCTIONS:  Please arrive to ADMITTING down the hall from the [place] main entrance of [facility] at: xxx  Do not eat or drink after midnight prior to procedure  On the morning of your procedure do not take any medications.  Plan for one night stay.  You will need someone to drive you home at discharge.    Cardiac Ablation Cardiac ablation is a procedure to disable (ablate) a small amount of heart tissue in very specific places. The heart has many electrical connections. Sometimes these connections are  abnormal and can cause the heart to beat very fast or irregularly. Ablating some of the problem areas can improve the heart rhythm or return it to normal. Ablation may be done for people who:  Have Wolff-Parkinson-White syndrome.  Have fast heart rhythms (tachycardia).  Have taken medicines for an abnormal heart rhythm (arrhythmia) that were not effective or caused side effects.  Have a high-risk heartbeat that may be life-threatening. During the procedure, a small incision is made in the neck or the groin, and a long, thin, flexible tube (catheter) is inserted into the incision and moved to the heart. Small devices (electrodes) on the tip of the catheter will send out electrical currents. A type of X-ray (fluoroscopy) will be used to help guide the catheter and to provide images of the heart. Tell a health care provider about:  Any allergies you have.  All medicines you are taking, including vitamins, herbs, eye drops, creams, and over-the-counter medicines.  Any problems you or family members have had with anesthetic medicines.  Any blood disorders you have.  Any surgeries you have had.  Any medical conditions you have, such as kidney failure.  Whether you are pregnant or may be pregnant. What are the risks? Generally, this is a safe procedure. However, problems may occur, including:  Infection.  Bruising and bleeding at the catheter insertion site.  Bleeding into the chest, especially into the sac that surrounds the heart. This is a serious complication.  Stroke or blood clots.  Damage to other structures or organs.  Allergic reaction to medicines or dyes.  Need for a permanent pacemaker if the normal electrical system is damaged. A pacemaker is a small computer that sends electrical signals to the heart and helps your heart beat normally.  The procedure not being fully effective. This may not be recognized until months later. Repeat ablation procedures are sometimes  required. What happens before the procedure?  Follow instructions from your health care provider about eating or drinking restrictions.  Ask your health care provider about: ? Changing or stopping your regular medicines. This is especially important if you are taking diabetes medicines or blood thinners. ? Taking medicines such as aspirin and ibuprofen. These medicines can thin your blood. Do not take these medicines before your procedure if your health care provider instructs you not to.  Plan to have someone take you home from the hospital or clinic.  If you will be going home right after the procedure, plan to have someone with you for 24 hours. What happens during the procedure?  To lower your risk of infection: ? Your health care team will wash or sanitize their hands. ? Your skin will be washed with soap. ? Hair may be removed from the incision area.  An IV tube will be inserted into one of your veins.  You will be given a medicine to help you relax (sedative).  The skin on your neck or groin  will be numbed.  An incision will be made in your neck or your groin.  A needle will be inserted through the incision and into a large vein in your neck or groin.  A catheter will be inserted into the needle and moved to your heart.  Dye may be injected through the catheter to help your surgeon see the area of the heart that needs treatment.  Electrical currents will be sent from the catheter to ablate heart tissue in desired areas. There are three types of energy that may be used to ablate heart tissue: ? Heat (radiofrequency energy). ? Laser energy. ? Extreme cold (cryoablation).  When the necessary tissue has been ablated, the catheter will be removed.  Pressure will be held on the catheter insertion area to prevent excessive bleeding.  A bandage (dressing) will be placed over the catheter insertion area. The procedure may vary among health care providers and hospitals. What  happens after the procedure?  Your blood pressure, heart rate, breathing rate, and blood oxygen level will be monitored until the medicines you were given have worn off.  Your catheter insertion area will be monitored for bleeding. You will need to lie still for a few hours to ensure that you do not bleed from the catheter insertion area.  Do not drive for 24 hours or as long as directed by your health care provider. Summary  Cardiac ablation is a procedure to disable (ablate) a small amount of heart tissue in very specific places. Ablating some of the problem areas can improve the heart rhythm or return it to normal.  During the procedure, electrical currents will be sent from the catheter to ablate heart tissue in desired areas. This information is not intended to replace advice given to you by your health care provider. Make sure you discuss any questions you have with your health care provider. Document Released: 12/12/2008 Document Revised: 06/14/2016 Document Reviewed: 06/14/2016 Elsevier Interactive Patient Education  2019 [employer].

## 2018-08-21 ENCOUNTER — Ambulatory Visit (HOSPITAL_COMMUNITY): Payer: 59 | Attending: Cardiovascular Disease

## 2018-08-21 ENCOUNTER — Other Ambulatory Visit: Payer: 59 | Admitting: *Deleted

## 2018-08-21 ENCOUNTER — Other Ambulatory Visit: Payer: Self-pay

## 2018-08-21 DIAGNOSIS — I480 Paroxysmal atrial fibrillation: Secondary | ICD-10-CM | POA: Diagnosis not present

## 2018-08-21 LAB — CBC WITH DIFFERENTIAL/PLATELET
BASOS: 1 %
Basophils Absolute: 0 10*3/uL (ref 0.0–0.2)
EOS (ABSOLUTE): 0.1 10*3/uL (ref 0.0–0.4)
EOS: 2 %
HEMATOCRIT: 41.8 % (ref 37.5–51.0)
Hemoglobin: 14.1 g/dL (ref 13.0–17.7)
Immature Grans (Abs): 0 10*3/uL (ref 0.0–0.1)
Immature Granulocytes: 0 %
Lymphocytes Absolute: 1.2 10*3/uL (ref 0.7–3.1)
Lymphs: 22 %
MCH: 31.1 pg (ref 26.6–33.0)
MCHC: 33.7 g/dL (ref 31.5–35.7)
MCV: 92 fL (ref 79–97)
MONOCYTES: 13 %
Monocytes Absolute: 0.7 10*3/uL (ref 0.1–0.9)
Neutrophils Absolute: 3.4 10*3/uL (ref 1.4–7.0)
Neutrophils: 62 %
Platelets: 202 10*3/uL (ref 150–450)
RBC: 4.53 x10E6/uL (ref 4.14–5.80)
RDW: 12.3 % (ref 11.6–15.4)
WBC: 5.4 10*3/uL (ref 3.4–10.8)

## 2018-08-21 LAB — BASIC METABOLIC PANEL
BUN/Creatinine Ratio: 15 (ref 10–24)
BUN: 18 mg/dL (ref 8–27)
CO2: 24 mmol/L (ref 20–29)
CREATININE: 1.18 mg/dL (ref 0.76–1.27)
Calcium: 9.3 mg/dL (ref 8.6–10.2)
Chloride: 103 mmol/L (ref 96–106)
GFR calc Af Amer: 74 mL/min/{1.73_m2} (ref >59)
GFR, EST NON AFRICAN AMERICAN: 64 mL/min/{1.73_m2} (ref >59)
Glucose: 87 mg/dL (ref 65–99)
Potassium: 4.9 mmol/L (ref 3.5–5.2)
Sodium: 142 mmol/L (ref 134–144)

## 2018-09-01 ENCOUNTER — Telehealth (HOSPITAL_COMMUNITY): Payer: Self-pay | Admitting: Emergency Medicine

## 2018-09-01 NOTE — Telephone Encounter (Signed)
Pt returning phone call--  pt verbalizes understanding of appt date/time, parking situation and where to check in, pre-test NPO status and medications ordered, and verified current allergies; name and call back number provided for further questions should they arise [name] RN Navigator Cardiac Imaging [facility] [phone] office [phone] cell

## 2018-09-01 NOTE — Telephone Encounter (Signed)
Left message on voicemail with name and callback number Albertha Beattie RN Navigator Cardiac Imaging [facility] [phone] Office [phone] Cell  

## 2018-09-04 ENCOUNTER — Ambulatory Visit (HOSPITAL_COMMUNITY)
Admission: RE | Admit: 2018-09-04 | Discharge: 2018-09-04 | Disposition: A | Discharge status: Home / Self Care | Payer: 59 | Source: Ambulatory Visit | Attending: Internal Medicine | Admitting: Internal Medicine

## 2018-09-04 ENCOUNTER — Encounter (HOSPITAL_COMMUNITY): Payer: Self-pay

## 2018-09-04 DIAGNOSIS — R079 Chest pain, unspecified: Secondary | ICD-10-CM

## 2018-09-04 DIAGNOSIS — I480 Paroxysmal atrial fibrillation: Secondary | ICD-10-CM | POA: Insufficient documentation

## 2018-09-04 MED ORDER — NITROGLYCERIN 0.4 MG SL SUBL
SUBLINGUAL_TABLET | SUBLINGUAL | Status: AC
  Filled 2018-09-04: qty 2

## 2018-09-04 MED ORDER — IOPAMIDOL (ISOVUE-370) INJECTION 76%
80.0000 mL | Freq: Once | INTRAVENOUS | Status: AC | PRN
  Administered 2018-09-04: 100 mL via INTRAVENOUS

## 2018-09-04 MED ORDER — NITROGLYCERIN 0.4 MG SL SUBL
0.8000 mg | SUBLINGUAL_TABLET | Freq: Once | SUBLINGUAL | Status: AC
  Administered 2018-09-04: 0.8 mg via SUBLINGUAL
  Filled 2018-09-04: qty 25

## 2018-09-04 NOTE — Progress Notes (Signed)
CT scan completed. Tolerated well. D/C home walking, awake and alert. In no distress. 

## 2018-09-07 ENCOUNTER — Other Ambulatory Visit: Payer: Self-pay

## 2018-09-07 ENCOUNTER — Encounter (HOSPITAL_COMMUNITY): Payer: Self-pay | Admitting: Certified Registered"

## 2018-09-07 ENCOUNTER — Ambulatory Visit (HOSPITAL_COMMUNITY)
Admission: RE | Admit: 2018-09-07 | Discharge: 2018-09-08 | Disposition: A | Discharge status: Home / Self Care | Payer: 59 | Attending: Internal Medicine | Admitting: Internal Medicine

## 2018-09-07 ENCOUNTER — Ambulatory Visit (HOSPITAL_COMMUNITY): Payer: 59 | Admitting: Certified Registered"

## 2018-09-07 ENCOUNTER — Encounter (HOSPITAL_COMMUNITY)
Admission: RE | Disposition: A | Discharge status: Home / Self Care | Payer: Self-pay | Source: Home / Self Care | Attending: Internal Medicine

## 2018-09-07 DIAGNOSIS — I480 Paroxysmal atrial fibrillation: Secondary | ICD-10-CM | POA: Diagnosis present

## 2018-09-07 DIAGNOSIS — Z888 Allergy status to other drugs, medicaments and biological substances status: Secondary | ICD-10-CM | POA: Diagnosis not present

## 2018-09-07 DIAGNOSIS — I10 Essential (primary) hypertension: Secondary | ICD-10-CM | POA: Diagnosis not present

## 2018-09-07 DIAGNOSIS — Z79899 Other long term (current) drug therapy: Secondary | ICD-10-CM | POA: Insufficient documentation

## 2018-09-07 DIAGNOSIS — I483 Typical atrial flutter: Secondary | ICD-10-CM | POA: Diagnosis not present

## 2018-09-07 DIAGNOSIS — Z7901 Long term (current) use of anticoagulants: Secondary | ICD-10-CM | POA: Diagnosis not present

## 2018-09-07 DIAGNOSIS — I4891 Unspecified atrial fibrillation: Secondary | ICD-10-CM | POA: Diagnosis not present

## 2018-09-07 DIAGNOSIS — R079 Chest pain, unspecified: Secondary | ICD-10-CM

## 2018-09-07 HISTORY — PX: ATRIAL FIBRILLATION ABLATION: EP1191

## 2018-09-07 LAB — POCT ACTIVATED CLOTTING TIME
Activated Clotting Time: 279 seconds
Activated Clotting Time: 345 seconds
Activated Clotting Time: 169 seconds

## 2018-09-07 SURGERY — ATRIAL FIBRILLATION ABLATION
Anesthesia: General

## 2018-09-07 MED ORDER — LIDOCAINE 2% (20 MG/ML) 5 ML SYRINGE
INTRAMUSCULAR | Status: DC | PRN
  Administered 2018-09-07: 60 mg via INTRAVENOUS

## 2018-09-07 MED ORDER — ONDANSETRON HCL 4 MG/2ML IJ SOLN
INTRAMUSCULAR | Status: DC | PRN
  Administered 2018-09-07: 4 mg via INTRAVENOUS

## 2018-09-07 MED ORDER — SODIUM CHLORIDE 0.9 % IV SOLN
INTRAVENOUS | Status: DC | PRN
  Administered 2018-09-07: 40 ug/min via INTRAVENOUS

## 2018-09-07 MED ORDER — BUPIVACAINE HCL (PF) 0.25 % IJ SOLN
INTRAMUSCULAR | Status: AC
  Filled 2018-09-07: qty 30

## 2018-09-07 MED ORDER — HEPARIN (PORCINE) IN NACL 1000-0.9 UT/500ML-% IV SOLN
INTRAVENOUS | Status: DC | PRN
  Administered 2018-09-07: 500 mL

## 2018-09-07 MED ORDER — SODIUM CHLORIDE 0.9% FLUSH: 3.0000 mL | INTRAVENOUS | Status: DC | PRN

## 2018-09-07 MED ORDER — ROCURONIUM BROMIDE 50 MG/5ML IV SOSY
PREFILLED_SYRINGE | INTRAVENOUS | Status: DC | PRN
  Administered 2018-09-07: 50 mg via INTRAVENOUS
  Administered 2018-09-07: 20 mg via INTRAVENOUS
  Administered 2018-09-07: 10 mg via INTRAVENOUS

## 2018-09-07 MED ORDER — SUGAMMADEX SODIUM 200 MG/2ML IV SOLN
INTRAVENOUS | Status: DC | PRN
  Administered 2018-09-07: 200 mg via INTRAVENOUS

## 2018-09-07 MED ORDER — SODIUM CHLORIDE 0.9 % IV SOLN: 250.0000 mL | INTRAVENOUS | Status: DC | PRN

## 2018-09-07 MED ORDER — ACETAMINOPHEN 500 MG PO TABS: 1000.0000 mg | ORAL_TABLET | Freq: Once | ORAL | Status: DC

## 2018-09-07 MED ORDER — ONDANSETRON HCL 4 MG/2ML IJ SOLN: 4.0000 mg | Freq: Four times a day (QID) | INTRAMUSCULAR | Status: DC | PRN

## 2018-09-07 MED ORDER — PHENYLEPHRINE 40 MCG/ML (10ML) SYRINGE FOR IV PUSH (FOR BLOOD PRESSURE SUPPORT)
PREFILLED_SYRINGE | INTRAVENOUS | Status: DC | PRN
  Administered 2018-09-07 (×4): 80 ug via INTRAVENOUS

## 2018-09-07 MED ORDER — PROPOFOL 500 MG/50ML IV EMUL
INTRAVENOUS | Status: DC | PRN
  Administered 2018-09-07: 25 ug/kg/min via INTRAVENOUS

## 2018-09-07 MED ORDER — HEPARIN (PORCINE) IN NACL 1000-0.9 UT/500ML-% IV SOLN
INTRAVENOUS | Status: AC
  Filled 2018-09-07: qty 500

## 2018-09-07 MED ORDER — HEPARIN SODIUM (PORCINE) 1000 UNIT/ML IJ SOLN
INTRAMUSCULAR | Status: DC | PRN
  Administered 2018-09-07: 1000 [IU] via INTRAVENOUS
  Administered 2018-09-07: 12000 [IU] via INTRAVENOUS

## 2018-09-07 MED ORDER — BUPIVACAINE HCL (PF) 0.25 % IJ SOLN
INTRAMUSCULAR | Status: DC | PRN
  Administered 2018-09-07: 30 mL

## 2018-09-07 MED ORDER — HEPARIN SODIUM (PORCINE) 1000 UNIT/ML IJ SOLN
INTRAMUSCULAR | Status: DC | PRN
  Administered 2018-09-07: 5000 [IU] via INTRAVENOUS
  Administered 2018-09-07: 2000 [IU] via INTRAVENOUS

## 2018-09-07 MED ORDER — SODIUM CHLORIDE 0.9% FLUSH
3.0000 mL | Freq: Two times a day (BID) | INTRAVENOUS | Status: DC
  Administered 2018-09-07: 3 mL via INTRAVENOUS

## 2018-09-07 MED ORDER — ISOPROTERENOL HCL 0.2 MG/ML IJ SOLN
INTRAMUSCULAR | Status: AC
  Filled 2018-09-07: qty 5

## 2018-09-07 MED ORDER — RIVAROXABAN 20 MG PO TABS
20.0000 mg | ORAL_TABLET | Freq: Every day | ORAL | Status: DC
  Administered 2018-09-07: 20:00:00 20 mg via ORAL
  Filled 2018-09-07: qty 1

## 2018-09-07 MED ORDER — OFF THE BEAT BOOK
Freq: Once | Status: AC
  Administered 2018-09-07: 17:00:00
  Filled 2018-09-07: qty 1

## 2018-09-07 MED ORDER — ISOPROTERENOL HCL 0.2 MG/ML IJ SOLN
INTRAVENOUS | Status: DC | PRN
  Administered 2018-09-07: 20 ug/min via INTRAVENOUS

## 2018-09-07 MED ORDER — FENTANYL CITRATE (PF) 100 MCG/2ML IJ SOLN
INTRAMUSCULAR | Status: DC | PRN
  Administered 2018-09-07: 25 ug via INTRAVENOUS

## 2018-09-07 MED ORDER — PROPOFOL 10 MG/ML IV BOLUS
INTRAVENOUS | Status: DC | PRN
  Administered 2018-09-07: 150 mg via INTRAVENOUS

## 2018-09-07 MED ORDER — SODIUM CHLORIDE 0.9 % IV SOLN
INTRAVENOUS | Status: DC
  Administered 2018-09-07 (×2): via INTRAVENOUS

## 2018-09-07 MED ORDER — EPHEDRINE SULFATE 50 MG/ML IJ SOLN
INTRAMUSCULAR | Status: DC | PRN
  Administered 2018-09-07: 10 mg via INTRAVENOUS

## 2018-09-07 MED ORDER — ACETAMINOPHEN 325 MG PO TABS: 650.0000 mg | ORAL_TABLET | ORAL | Status: DC | PRN

## 2018-09-07 MED ORDER — PROTAMINE SULFATE 10 MG/ML IV SOLN
INTRAVENOUS | Status: DC | PRN
  Administered 2018-09-07 (×2): 10 mg via INTRAVENOUS
  Administered 2018-09-07: 20 mg via INTRAVENOUS

## 2018-09-07 MED ORDER — HEPARIN SODIUM (PORCINE) 1000 UNIT/ML IJ SOLN
INTRAMUSCULAR | Status: AC
  Filled 2018-09-07: qty 2

## 2018-09-07 MED ORDER — HYDROCODONE-ACETAMINOPHEN 5-325 MG PO TABS: 1.0000 | ORAL_TABLET | ORAL | Status: DC | PRN

## 2018-09-07 SURGICAL SUPPLY — 17 items
BLANKET WARM UNDERBOD FULL ACC (MISCELLANEOUS) ×2 IMPLANT
CATH MAPPNG PENTARAY F 2-6-2MM (CATHETERS) ×1 IMPLANT
CATH NAVISTAR SMARTTOUCH DF (ABLATOR) ×2 IMPLANT
CATH SOUNDSTAR ECO 8FR (CATHETERS) ×2 IMPLANT
CATH WEBSTER BI DIR CS D-F CRV (CATHETERS) ×2 IMPLANT
COVER SWIFTLINK CONNECTOR (BAG) ×2 IMPLANT
NEEDLE BAYLIS TRANSSEPTAL 71CM (NEEDLE) ×2 IMPLANT
PACK EP LATEX FREE (CUSTOM PROCEDURE TRAY) ×1
PACK EP LF (CUSTOM PROCEDURE TRAY) ×1 IMPLANT
PAD PRO RADIOLUCENT 2001M-C (PAD) ×2 IMPLANT
PATCH CARTO3 (PAD) ×2 IMPLANT
PENTARAY F 2-6-2MM (CATHETERS) ×2
SHEATH PINNACLE 7F 10CM (SHEATH) ×4 IMPLANT
SHEATH PINNACLE 8F 10CM (SHEATH) ×2 IMPLANT
SHEATH PINNACLE 9F 10CM (SHEATH) ×2 IMPLANT
SHEATH SWARTZ TS SL2 63CM 8.5F (SHEATH) ×2 IMPLANT
TUBING SMART ABLATE COOLFLOW (TUBING) ×2 IMPLANT

## 2018-09-07 NOTE — Progress Notes (Signed)
Order entered to evaluate FFR.

## 2018-09-07 NOTE — H&P (Signed)
PCP: [name], MD Primary Cardiologist: Dr Percival Spanish Electrophysiologist: Dr Rayann Heman  CC: Consult for paroxysmal atrial fibrillation  Nicholas Perry is a 68 y.o. male seen today at the request of Dr Percival Spanish for ablation of paroxysmal atrial fibrillation and atrial flutter. He was first diagnosed in 2016. He was initially treated with pill-in-pocket BB and anticoagulation for CHADS2VASC score of 2 but continued to have symptomatic episodes and he was started on flecainide. When in afib, he feels lightheaded and has palpitations.  He reports that his AF burden was much higher with flecainide.  He is doing a little better since stopping this medicine.  He has reduced ETOH use. He has found that drinking cold water will cause afib.  Stress and other vagal triggers are also noted.  He also thinks that he has had atrial flutter due to regular palpitations.   Denies snoring or daytime somnolence.  He did have some recent chest and jaw pain as well as SOB during AF with RVR.  He/she denies dyspnea, PND, orthopnea, nausea, vomiting, dizziness, syncope, edema, weight gain, or early satiety.      Past Medical History:  Diagnosis Date  . Hypertension   . Paroxysmal atrial fibrillation (HCC)   . Partial tear of rotator cuff 06/27/2015  . 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 Seizure after head injury ([place]) 12/14/2013  . Shoulder pain, left         Past Surgical History:  Procedure Laterality Date  . knee scopes Bilateral   . NASAL SEPTUM SURGERY    . right inguinal hernia     child  . SHOULDER ACROMIOPLASTY Left 06/27/2015   Procedure: SHOULDER ACROMIOPLASTY DEBRIDE LABRUM;  Surgeon: [name], MD;  Location: [facility];  Service: Orthopedics;  Laterality: Left;  . SHOULDER ARTHROSCOPY WITH ROTATOR CUFF REPAIR Left 06/27/2015   Procedure: SHOULDER ARTHROSCOPY WITH ROTATOR CUFF REPAIR;  Surgeon: [name], MD;  Location: [facility];   Service: Orthopedics;  Laterality: Left;  . TONSILECTOMY/ADENOIDECTOMY WITH MYRINGOTOMY     child          Current Outpatient Medications  Medication Sig Dispense Refill  . diltiazem (CARDIZEM) 30 MG tablet Take 1 tablet (30 mg total) by mouth 2 (two) times daily. 180 tablet 3  . metoprolol tartrate (LOPRESSOR) 25 MG tablet Take 0.5 tablets (12.5 mg total) by mouth 2 (two) times daily. 90 tablet 3  . rivaroxaban (XARELTO) 20 MG TABS tablet Take 1 tablet (20 mg total) by mouth daily with supper. 90 tablet 3   No current facility-administered medications for this visit.     Allergies:   Cardura [doxazosin mesylate] and Simvastatin   Social History: Social History        Socioeconomic History  . Marital status: Married    Spouse name: Not on file  . Number of children: 3  . Years of education: MD  . Highest education level: Not on file  Occupational History  . Occupation: N/A  Social Needs  . Financial resource strain: Not on file  . Food insecurity:    Worry: Not on file    Inability: Not on file  . Transportation needs:    Medical: Not on file    Non-medical: Not on file  Tobacco Use  . Smoking status: Never Smoker  . Smokeless tobacco: Never Used  Substance and Sexual Activity  . Alcohol use: Yes    Alcohol/week: 7.0 - 14.0 standard drinks    Types: 7 - 14  Standard drinks or equivalent per week    Comment: glass of wine nightly  . Drug use: No  . Sexual activity: Not on file  Lifestyle  . Physical activity:    Days per week: Not on file    Minutes per session: Not on file  . Stress: Not on file  Relationships  . Social connections:    Talks on phone: Not on file    Gets together: Not on file    Attends religious service: Not on file    Active member of club or organization: Not on file    Attends meetings of clubs or organizations: Not on file    Relationship status: Not on file  . Intimate partner violence:     Fear of current or ex partner: Not on file    Emotionally abused: Not on file    Physically abused: Not on file    Forced sexual activity: Not on file  Other Topics Concern  . Not on file  Social History Narrative   Married (2nd marriage in 2010). 3 children from first marriage. 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   Patient drinks about 1 cup of caffeine daily and occasionally has a soda.     Family History:      Family History  Problem Relation Age of Onset  . Cancer - Other Father        renal cell metastatic  . CAD Father 69  . Hypertension Brother        smoker  . CAD Brother 28       stent, smoker    Review of Systems: All other systems reviewed and are otherwise negative except as noted above.   Physical Exam: Vitals:   09/07/18 0845  BP: 128/88  Pulse: (!) 51  Resp: 18  Temp: 98.4 F (36.9 C)  SpO2: 99%    GEN- The patient is well appearing, alert and oriented x 3 today.   HEENT: normocephalic, atraumatic; sclera clear, conjunctiva pink; hearing intact; oropharynx clear; neck supple, no JVP Lymph- no cervical lymphadenopathy Lungs- Clear to ausculation bilaterally, normal work of breathing.  No wheezes, rales, rhonchi Heart- Regular rate and rhythm, no murmurs, rubs or gallops, PMI not laterally displaced GI- soft, non-tender, non-distended, bowel sounds present, no hepatosplenomegaly Extremities- no clubbing, cyanosis, or edema; DP/PT/radial pulses 2+ bilaterally MS- no significant deformity or atrophy Skin- warm and  dry, no rash or lesion  Psych- euthymic mood, full affect Neuro- strength and sensation are intact    Other studies Reviewed: Additional studies/ records that were reviewed today include: Dr Percival Spanish notes Echo 07/2015 - Left ventricle: The cavity size was normal. Systolic function was normal. The estimated ejection fraction was in the range of 55% to 60%. Wall motion was normal; there were no regional wall motion abnormalities. Doppler parameters are consistent with abnormal left ventricular relaxation (grade 1 diastolic dysfunction). - Aortic valve: There was mild regurgitation. - Mitral valve: Mild, late systolicprolapse, involving the anterior leaflet and the  posterior leaflet. - Left atrium: The atrium was mildly dilated. (4mm) - Right ventricle: The cavity size was moderately dilated. Wall thickness was normal.  ETT 06/21/18  Blood pressure demonstrated a normal response to exercise.  There was no ST segment deviation noted during stress.  No T wave inversion was noted during stress.  Excellent exercise capacity.  Negative, adequate stress test.  Assessment and Plan:  1. Paroxysmal atrial fibrillation/ atrial flutter The patient has symptomatic, recurrent paroxysmal atrial fibrillation.  He also thinks that he may have had atrial flutter he has failed medical therapy with flecainde. Chads2vasc score is 2.  he is anticoagulated with xarelto . Therapeutic strategies for afib/ atrial flutter including medicine and ablation were discussed in detail with the patient today. Risk, benefits, and alternatives to EP study and radiofrequency ablation for afib were also discussed in detail today. These risks include but are not limited to stroke, bleeding, vascular damage, tamponade, perforation, damage to the esophagus, lungs, and other structures, pulmonary vein stenosis, worsening renal function, and death. The patient understands these risk and wishes to  proceed.  Cardiac CT reviewed with him today.  I will ask Dr Johnsie Cancel to comment on his cors. He reports compliance with xarelto without interruption.  Thompson Grayer MD, [facility] 09/07/2018 11:20 AM

## 2018-09-07 NOTE — Progress Notes (Addendum)
Site area: RFV x 3 Site Prior to Removal:  Level 0 Pressure Applied For: 30 min Manual:   0 Patient Status During Pull:  stable Post Pull Site:  Level 0 Post Pull Instructions Given:  yes Post Pull Pulses Present: palpable Dressing Applied:  clear Bedrest begins @ 9024 till 2130 Comments:

## 2018-09-07 NOTE — Anesthesia Postprocedure Evaluation (Signed)
Anesthesia Post Note  Patient: Nicholas Perry  Procedure(s) Performed: ATRIAL FIBRILLATION ABLATION (N/A )     Patient location during evaluation: PACU Anesthesia Type: General Level of consciousness: awake and alert Pain management: pain level controlled Vital Signs Assessment: post-procedure vital signs reviewed and stable Respiratory status: spontaneous breathing, nonlabored ventilation and respiratory function stable Cardiovascular status: blood pressure returned to baseline and stable Postop Assessment: no apparent nausea or vomiting Anesthetic complications: no    Last Vitals:  Vitals:   09/07/18 1535 09/07/18 1549  BP:  122/78  Pulse: (!) 53 (!) 54  Resp: 12 16  Temp:  (!) 36.4 C  SpO2: 98% 99%    Last Pain:  Vitals:   09/07/18 1600  TempSrc:   PainSc: 0-No pain                 Charliee Krenz,W. EDMOND

## 2018-09-07 NOTE — Transfer of Care (Signed)
Immediate Anesthesia Transfer of Care Note  Patient: Nicholas Perry  Procedure(s) Performed: ATRIAL FIBRILLATION ABLATION (N/A )  Patient Location: Cath Lab  Anesthesia Type:General  Level of Consciousness: awake and patient cooperative  Airway & Oxygen Therapy: Patient Spontanous Breathing and Patient connected to nasal cannula oxygen  Post-op Assessment: Report given to RN and Post -op Vital signs reviewed and stable  Post vital signs: Reviewed and stable  Last Vitals:  Vitals Value Taken Time  BP 114/74 09/07/2018  2:37 PM  Temp    Pulse 62 09/07/2018  2:38 PM  Resp 13 09/07/2018  2:38 PM  SpO2 99 % 09/07/2018  2:38 PM  Vitals shown include unvalidated device data.  Last Pain:  Vitals:   09/07/18 0917  TempSrc:   PainSc: 0-No pain      Patients Stated Pain Goal: 4 (53/66/44 0347)  Complications: No apparent anesthesia complications

## 2018-09-07 NOTE — Anesthesia Procedure Notes (Signed)
Procedure Name: Intubation Date/Time: 09/07/2018 11:54 AM Performed by: Orlie Dakin, CRNA Pre-anesthesia Checklist: Patient identified, Emergency Drugs available, Suction available and Patient being monitored Patient Re-evaluated:Patient Re-evaluated prior to induction Oxygen Delivery Method: Circle system utilized Preoxygenation: Pre-oxygenation with 100% oxygen Induction Type: IV induction Ventilation: Mask ventilation without difficulty Laryngoscope Size: Miller and 3 Grade View: Grade II Tube type: Oral Tube size: 7.5 mm Number of attempts: 1 Airway Equipment and Method: Stylet Placement Confirmation: ETT inserted through vocal cords under direct vision,  positive ETCO2 and breath sounds checked- equal and bilateral Secured at: 23 cm Tube secured with: Tape Dental Injury: Teeth and Oropharynx as per pre-operative assessment  Comments: 4x4s bite block used for end of proc.

## 2018-09-07 NOTE — Anesthesia Preprocedure Evaluation (Addendum)
Anesthesia Evaluation  Patient identified by MRN, date of birth, ID band Patient awake    Reviewed: Allergy & Precautions, H&P , NPO status , Patient's Chart, lab work & pertinent test results, reviewed documented beta blocker date and time   Airway Mallampati: II  TM Distance: >3 FB Neck ROM: Full    Dental no notable dental hx. (+) Teeth Intact, Dental Advisory Given   Pulmonary neg pulmonary ROS,    Pulmonary exam normal breath sounds clear to auscultation       Cardiovascular hypertension, Pt. on medications and Pt. on home beta blockers + dysrhythmias Atrial Fibrillation  Rhythm:Regular Rate:Normal     Neuro/Psych negative neurological ROS  negative psych ROS   GI/Hepatic negative GI ROS, Neg liver ROS,   Endo/Other  negative endocrine ROS  Renal/GU negative Renal ROS  negative genitourinary   Musculoskeletal   Abdominal   Peds  Hematology negative hematology ROS (+)   Anesthesia Other Findings   Reproductive/Obstetrics negative OB ROS                            Anesthesia Physical Anesthesia Plan  ASA: III  Anesthesia Plan: General   Post-op Pain Management:    Induction: Intravenous  PONV Risk Score and Plan: 3 and Ondansetron, Dexamethasone and Midazolam  Airway Management Planned: Oral ETT  Additional Equipment:   Intra-op Plan:   Post-operative Plan: Extubation in OR  Informed Consent: I have reviewed the patients History and Physical, chart, labs and discussed the procedure including the risks, benefits and alternatives for the proposed anesthesia with the patient or authorized representative who has indicated his/her understanding and acceptance.     Dental advisory given  Plan Discussed with: CRNA  Anesthesia Plan Comments:         Anesthesia Quick Evaluation

## 2018-09-08 ENCOUNTER — Other Ambulatory Visit: Payer: Self-pay

## 2018-09-08 ENCOUNTER — Encounter (HOSPITAL_COMMUNITY): Payer: Self-pay | Admitting: Internal Medicine

## 2018-09-08 DIAGNOSIS — Z7901 Long term (current) use of anticoagulants: Secondary | ICD-10-CM | POA: Diagnosis not present

## 2018-09-08 DIAGNOSIS — I480 Paroxysmal atrial fibrillation: Secondary | ICD-10-CM

## 2018-09-08 DIAGNOSIS — Z79899 Other long term (current) drug therapy: Secondary | ICD-10-CM | POA: Diagnosis not present

## 2018-09-08 DIAGNOSIS — Z888 Allergy status to other drugs, medicaments and biological substances status: Secondary | ICD-10-CM | POA: Diagnosis not present

## 2018-09-08 DIAGNOSIS — I10 Essential (primary) hypertension: Secondary | ICD-10-CM | POA: Diagnosis not present

## 2018-09-08 DIAGNOSIS — I483 Typical atrial flutter: Secondary | ICD-10-CM | POA: Diagnosis not present

## 2018-09-08 MED ORDER — DILTIAZEM HCL 30 MG PO TABS: 30.0000 mg | ORAL_TABLET | Freq: Two times a day (BID) | ORAL | 6 refills | Status: DC | PRN

## 2018-09-08 MED ORDER — LOSARTAN POTASSIUM 25 MG PO TABS: 25.0000 mg | ORAL_TABLET | Freq: Every day | ORAL | 6 refills | Status: DC

## 2018-09-08 MED ORDER — PANTOPRAZOLE SODIUM 40 MG PO TBEC: 40.0000 mg | DELAYED_RELEASE_TABLET | Freq: Every day | ORAL | 0 refills | Status: DC

## 2018-09-08 MED FILL — PANTOPRAZOLE SOD DR 40 MG T: 40 | 45 days supply | Qty: 45 | Fill #0

## 2018-09-08 MED FILL — LOSARTAN POTASSIUM 25 MG TA: 25 | 30 days supply | Qty: 30 | Fill #0

## 2018-09-08 NOTE — Discharge Summary (Addendum)
ELECTROPHYSIOLOGY PROCEDURE DISCHARGE SUMMARY    Patient ID: Nicholas Perry,  MRN: [id number], DOB/AGE: [birth year] 68 y.o.  Admit date: 09/07/2018 Discharge date: 09/08/2018  Primary Care Physician: [name], MD Primary Cardiologist: Dr. Percival Spanish Electrophysiologist: Thompson Grayer, MD  Primary Discharge Diagnosis:  1. Paroxysmal Afib     CHA2DS2Vasc is 2, on Xarelto, Appropriately dosed  Secondary Discharge Diagnosis:  1. 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 HTN 3. CP  Procedures This Admission:  1.  Electrophysiology study and radiofrequency catheter ablation on 09/07/2018 by Dr Thompson Grayer.   This study demonstrated CONCLUSIONS: 1. Sinus rhythm upon presentation.   2. Intracardiac echo reveals a moderate sized left atrium with a common ostium to the LIPV and LSPV.  There was no evidence of pulmonary vein stenosis. 3. Successful electrical isolation and anatomical encircling of all four pulmonary veins with radiofrequency current.   4. Cavo-tricuspid isthmus ablation was performed with complete bidirectional isthmus block achieved.  5. A prominent PAC focus was identified and ablation along the carina between the RIPV and RSPV 6. Additional ablation performed at the SVC/RA junction 7. No inducible arrhythmias following ablation both on and off of Isuprel 8. No early apparent complications.     Brief HPI: Nicholas Perry is a 68 y.o. male with a history of paroxysmal atrial fibrillation.  They have failed medical therapy with Flecainide. Risks, benefits, and alternatives to catheter ablation of atrial fibrillation were reviewed with the patient who wished to proceed.  The patient underwent cardiac CT prior to the procedure which demonstrated and no LAA thrombus, Ca++ score of 204, noted in LM and throughout the LAD (h/o normal stress myoview in 2017, and negative ETT with excellent exercise capacity 06/21/18), FFR is pending.  In d/w Dr. Rayann Heman, CT was reviewed by reader,. Felt  to have non obstructive diease.    Hospital Course:  The patient was admitted and underwent EPS/RFCA of atrial fibrillation with details as outlined above.  They were monitored on telemetry overnight which demonstrated SR.  R groin/procedue site was without complication on the day of discharge.  The patient feels well, denies any CP or SOB.  Denies pain at procedure site.  He was examined by Dr.Kahealani Yankovich and considered to be stable for discharge.  Wound care and restrictions were reviewed with the patient.  The patient will be seen in the AFib clinic in 4 weeks and Dr Rayann Heman in 12 weeks for post ablation follow up.   CT for coronary evaluation will be followed up out patient Will change his lopressor to losartan for BP given fatigue with BB and his Diltiazem will be PRN only   Physical Exam: Vitals:   09/07/18 1549 09/07/18 1936 09/07/18 2000 09/08/18 0717  BP: 122/78 115/82  (!) 132/93  Pulse: (!) 54 64 60 63  Resp: 16 19 18 16   Temp: (!) 97.5 F (36.4 C) 98.1 F (36.7 C)  97.8 F (36.6 C)  TempSrc: Oral Oral  Oral  SpO2: 99% 97% 98% 98%  Weight:      Height:        GEN- The patient is well appearing, alert and oriented x 3 today.   HEENT: normocephalic, atraumatic; sclera clear, conjunctiva pink; hearing intact; neck supple  Lungs- CTA n/l, normal work of breathing.  No wheezes, rales, rhonchi Heart- RRR, no murmurs, rubs or gallops  GI- soft, non-tender, non-distended  Extremities- no clubbing, cyanosis, or edema; R DP/PT 2+, R groin without hematoma/bruit MS- no significant deformity or atrophy  Skin- warm and dry, no rash or lesion Psych- euthymic mood, full affect Neuro- no gross deficits are appreciated   Labs:   Lab Results  Component Value Date   WBC 5.4 08/21/2018   HGB 14.1 08/21/2018   HCT 41.8 08/21/2018   MCV 92 08/21/2018   PLT 202 08/21/2018   No results for input(s): NA, K, CL, CO2, BUN, CREATININE, CALCIUM, PROT, BILITOT, ALKPHOS, ALT, AST, GLUCOSE in the  last 168 hours.  Invalid input(s): LABALBU   Discharge Medications:  Allergies as of 09/08/2018      Reactions   Cardura [doxazosin Mesylate]    Caused patient to go into a. Fib.   Simvastatin    muscle aches      Medication List    STOP taking these medications   metoprolol tartrate 25 MG tablet Commonly known as:  LOPRESSOR     TAKE these medications   acetaminophen 325 MG tablet Commonly known as:  TYLENOL Take 650 mg by mouth daily as needed for moderate pain or headache.   diltiazem 30 MG tablet Commonly known as:  CARDIZEM Take 1 tablet (30 mg total) by mouth 2 (two) times daily as needed (palpitations). What changed:    when to take this  reasons to take this   losartan 25 MG tablet Commonly known as:  COZAAR Take 1 tablet (25 mg total) by mouth daily. Notes to patient:  Start Monday 09/11/2018   pantoprazole 40 MG tablet Commonly known as:  PROTONIX Take 1 tablet (40 mg total) by mouth daily.   rivaroxaban 20 MG Tabs tablet Commonly known as:  XARELTO Take 1 tablet (20 mg total) by mouth daily with supper.       Disposition: Home Discharge Instructions    Diet - low sodium heart healthy   Complete by:  As directed    Increase activity slowly   Complete by:  As directed      Follow-up Information    [facility]   Specialty:  Cardiology Why:  10/05/2018 @ 9:30AM Contact information: [address] [id number] [place] [address] [phone]       Thompson Grayer, MD Follow up.   Specialty:  Cardiology Why:  11/29/2018 @ 9:30AM Contact information: [place] [address] [phone]           Duration of Discharge Encounter: Greater than 30 minutes including physician time.  SignedTommye Standard, PA-C 09/08/2018 8:59 AM   I have seen patient, and reviewed the above assessment and plan.  Changes to above are made where necessary.  He is doing  very well post ablation.  Routine discharge and follow-up.  He will contact me personally with any issues related to the procedure.  Co Sign: Thompson Grayer, MD 09/08/2018 10:14 AM

## 2018-09-08 NOTE — Discharge Instructions (Addendum)
Post procedure care instructions No driving for 4 days. No lifting over 5 lbs for 1 week. No vigorous or sexual activity for 1 week. You may return to work on 09/15/2018. Keep procedure site clean & dry. If you notice increased pain, swelling, bleeding or pus, call/return!  You may shower, but no soaking baths/hot tubs/pools for 1 week.   You have an appointment set up with the [facility].  Multiple studies have shown that being followed by a dedicated atrial fibrillation clinic in addition to the standard care you receive from your other physicians improves health. We believe that enrollment in the atrial fibrillation clinic will allow [place] to better care for you.   The phone number to the [facility] is [phone]. The clinic is staffed Monday through Friday from 8:30am to 5pm.  Parking Directions: The clinic is located in the Heart and Vascular Building connected to [facility]. 1)From [address] turn on to [address] and go to the 3rd entrance  (Heart and Vascular entrance) on the right. 2)Look to the right for Heart &Vascular Parking Garage. 3)A code for the entrance is required please call the clinic to receive this.   4)Take the elevators to the 1st floor. Registration is in the room with the glass walls at the end of the hallway.  If you have any trouble parking or locating the clinic, please dont hesitate to call [phone].

## 2018-09-27 ENCOUNTER — Ambulatory Visit: Payer: 59

## 2018-09-27 ENCOUNTER — Ambulatory Visit (INDEPENDENT_AMBULATORY_CARE_PROVIDER_SITE_OTHER): Payer: 59

## 2018-09-27 DIAGNOSIS — Z23 Encounter for immunization: Secondary | ICD-10-CM | POA: Diagnosis not present

## 2018-09-27 NOTE — Progress Notes (Signed)
Per orders of Dr. Yong Channel , injection of Shingrix vaccine given by Franco Collet. Patient tolerated injection well.

## 2018-10-02 MED FILL — LOSARTAN POTASSIUM 25 MG TA: 25 | 30 days supply | Qty: 30 | Fill #1 | Status: TO

## 2018-10-05 ENCOUNTER — Encounter (HOSPITAL_COMMUNITY): Payer: Self-pay | Admitting: Nurse Practitioner

## 2018-10-05 ENCOUNTER — Ambulatory Visit (HOSPITAL_COMMUNITY)
Admission: RE | Admit: 2018-10-05 | Discharge: 2018-10-05 | Disposition: A | Discharge status: Home / Self Care | Payer: 59 | Source: Ambulatory Visit | Attending: Nurse Practitioner | Admitting: Nurse Practitioner

## 2018-10-05 VITALS — BP 108/74 | HR 61 | Ht 73.0 in | Wt 171.0 lb

## 2018-10-05 DIAGNOSIS — I10 Essential (primary) hypertension: Secondary | ICD-10-CM | POA: Insufficient documentation

## 2018-10-05 DIAGNOSIS — Z8249 Family history of ischemic heart disease and other diseases of the circulatory system: Secondary | ICD-10-CM | POA: Insufficient documentation

## 2018-10-05 DIAGNOSIS — Z888 Allergy status to other drugs, medicaments and biological substances status: Secondary | ICD-10-CM | POA: Diagnosis not present

## 2018-10-05 DIAGNOSIS — Z79899 Other long term (current) drug therapy: Secondary | ICD-10-CM | POA: Diagnosis not present

## 2018-10-05 DIAGNOSIS — Z8059 Family history of malignant neoplasm of other urinary tract organ: Secondary | ICD-10-CM | POA: Diagnosis not present

## 2018-10-05 DIAGNOSIS — I480 Paroxysmal atrial fibrillation: Secondary | ICD-10-CM

## 2018-10-05 DIAGNOSIS — Z7901 Long term (current) use of anticoagulants: Secondary | ICD-10-CM | POA: Insufficient documentation

## 2018-10-05 NOTE — Progress Notes (Signed)
Primary Care Physician: [name], MD Referring Physician: Dr. Zadie Cleverly Buresh is a 68 y.o. male with a h/o paroxysmal afib that is in the afib clinc for one month f/u ablation. He reports only one episode of afib several days after the procedure that lasted 15 mins. Otherwise, only notices a few seconds of mild chest discomfort and fullness in the chest that happens 1-2 a week. It feels as afib may occur, but it doesn't. He was having similar episodes before the procedure. No swallowing or groin issues.  Today, he denies symptoms of palpitations, chest pain, shortness of breath, orthopnea, PND, lower extremity edema, dizziness, presyncope, syncope, or neurologic sequela. The patient is tolerating medications without difficulties and is otherwise without complaint today.   Past Medical History:  Diagnosis Date  . Hypertension   . Paroxysmal atrial fibrillation (HCC)   . Partial tear of rotator cuff 06/27/2015  . 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 Seizure after head injury ([place]) 12/14/2013  . Shoulder pain, left    Past Surgical History:  Procedure Laterality Date  . ATRIAL FIBRILLATION ABLATION N/A 09/07/2018   Procedure: ATRIAL FIBRILLATION ABLATION;  Surgeon: Thompson Grayer, MD;  Location: [facility];  Service: Cardiovascular;  Laterality: N/A;  . knee scopes Bilateral   . NASAL SEPTUM SURGERY    . right inguinal hernia     child  . SHOULDER ACROMIOPLASTY Left 06/27/2015   Procedure: SHOULDER ACROMIOPLASTY DEBRIDE LABRUM;  Surgeon: [name], MD;  Location: [facility];  Service: Orthopedics;  Laterality: Left;  . SHOULDER ARTHROSCOPY WITH ROTATOR CUFF REPAIR Left 06/27/2015   Procedure: SHOULDER ARTHROSCOPY WITH ROTATOR CUFF REPAIR;  Surgeon: [name], MD;  Location: [facility];  Service: Orthopedics;  Laterality: Left;  . TONSILECTOMY/ADENOIDECTOMY WITH MYRINGOTOMY     child    Current Outpatient Medications    Medication Sig Dispense Refill  . acetaminophen (TYLENOL) 325 MG tablet Take 650 mg by mouth daily as needed for moderate pain or headache.    . losartan (COZAAR) 25 MG tablet Take 1 tablet (25 mg total) by mouth daily. 30 tablet 6  . pantoprazole (PROTONIX) 40 MG tablet Take 1 tablet (40 mg total) by mouth daily. 45 tablet 0  . rivaroxaban (XARELTO) 20 MG TABS tablet Take 1 tablet (20 mg total) by mouth daily with supper. 90 tablet 3  . diltiazem (CARDIZEM) 30 MG tablet Take 1 tablet (30 mg total) by mouth 2 (two) times daily as needed (palpitations). (Patient not taking: Reported on 10/05/2018) 60 tablet 6   No current facility-administered medications for this encounter.     Allergies  Allergen Reactions  . Cardura [Doxazosin Mesylate]     Caused patient to go into a. Fib.  . Simvastatin     muscle aches    Social History   Socioeconomic History  . Marital status: Married    Spouse name: Not on file  . Number of children: 3  . Years of education: MD  . Highest education level: Not on file  Occupational History  . Occupation: N/A  Social Needs  . Financial resource strain: Not on file  . Food insecurity:    Worry: Not on file    Inability: Not on file  . Transportation needs:    Medical: Not on file    Non-medical: Not on file  Tobacco Use  . Smoking status: Never Smoker  . Smokeless tobacco: Never Used  Substance and Sexual Activity  .  Alcohol use: Yes    Alcohol/week: 7.0 - 14.0 standard drinks    Types: 7 - 14 Standard drinks or equivalent per week    Comment: glass of wine nightly  . Drug use: No  . Sexual activity: Not on file  Lifestyle  . Physical activity:    Days per week: Not on file    Minutes per session: Not on file  . Stress: Not on file  Relationships  . Social connections:    Talks on phone: Not on file    Gets together: Not on file    Attends religious service: Not on file    Active member of club or organization: Not on file    Attends  meetings of clubs or organizations: Not on file    Relationship status: Not on file  . Intimate partner violence:    Fear of current or ex partner: Not on file    Emotionally abused: Not on file    Physically abused: Not on file    Forced sexual activity: Not on file  Other Topics Concern  . Not on file  Social History Narrative   Married (2nd marriage in 2010). 3 children from first marriage. 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   Patient drinks about 1 cup of caffeine daily and occasionally has a soda.     Family History  Problem Relation Age of Onset  . Cancer - Other Father        renal cell metastatic  . CAD Father 66  . Hypertension Brother        smoker  . CAD Brother 61       stent, smoker    ROS- All systems are reviewed and negative except as per the HPI above  Physical Exam: Vitals:   10/05/18 0926  BP: 108/74  Pulse: 61  Weight: 77.6 kg  Height: 6\' 1"  (1.854 m)   Wt Readings from Last 3 Encounters:  10/05/18 77.6 kg  09/07/18 77.1 kg  07/31/18 79.8 kg    Labs: Lab Results  Component Value Date   NA 142 08/21/2018   K 4.9 08/21/2018   CL 103 08/21/2018   CO2 24 08/21/2018   GLUCOSE 87 08/21/2018   BUN 18 08/21/2018   CREATININE 1.18 08/21/2018   CALCIUM 9.3 08/21/2018   MG 2.0 07/24/2018   No results found for: INR Lab Results  Component Value Date   CHOL 269 (H) 05/04/2018   HDL 120.20 05/04/2018   LDLCALC 135 (H) 05/04/2018   TRIG 66.0 05/04/2018     GEN- The patient is well appearing, alert and oriented x 3 today.   Head-  normocephalic, atraumatic Eyes-  Sclera clear, conjunctiva pink Ears- hearing intact Oropharynx- clear Neck- supple, no JVP Lymph- no cervical lymphadenopathy Lungs- Clear to ausculation bilaterally, normal work of breathing Heart- Regular rate and rhythm, no murmurs, rubs or gallops, PMI not laterally displaced GI- soft, NT, ND, + BS Extremities- no clubbing, cyanosis, or edema MS- no significant deformity  or atrophy Skin- no rash or lesion Psych- euthymic mood, full affect Neuro- strength and sensation are intact  EKG-NSR at 61 bpm, pr int 120 ms, qrs int 90 ms, qtc 408 ms Epic records reviewed    Assessment and Plan: 1.Paroxysmal afib  Doing very welll s/p ablation, staying in SR Is having a few seconds of sensation of chest discomfort with throat pressure that is not exertional, that pt is questioning may be arrhythmia Right now the sensation is not that often, but if it increases then we could place a ZIo patch for 1-2 weeks   2. CHA2DS2VASc score of 2(age/HTN) Continue xarelto 20 mg daily Reminded not to interrupt anticoagulation for the 3 month healing period  F/u with Dr. Rayann Heman 4/22 afib clinic as needed  Butch Penny C. Micki Cassel, [facility] [address] [place], [place] [address] [phone]

## 2018-10-19 MED FILL — XARELTO 20 MG TABLET: 20 | 90 days supply | Qty: 90 | Fill #2 | Status: TO

## 2018-11-06 MED FILL — LOSARTAN POTASSIUM 25 MG TA: 25 | 30 days supply | Qty: 30 | Fill #0

## 2018-11-14 DIAGNOSIS — Z0289 Encounter for other administrative examinations: Secondary | ICD-10-CM

## 2018-11-15 ENCOUNTER — Telehealth: Payer: Self-pay

## 2018-11-15 NOTE — Telephone Encounter (Signed)
Attending Physician's Statement has been completed/signed by Dr. Jannifer Franklin.   Form has been fwd back to medical records for processing.

## 2018-11-20 ENCOUNTER — Telehealth: Payer: Self-pay

## 2018-11-20 NOTE — Telephone Encounter (Signed)
Spoke with pt wife regarding appt on 11/24/18. Pt wife was advise to check pt vitals day of appt. Pt wife stated pt had no concerns.

## 2018-11-20 NOTE — Telephone Encounter (Signed)
Follow up     Pts wife is returning call   Please call back  

## 2018-11-20 NOTE — Telephone Encounter (Signed)
Left message regarding appt on 11/24/18.

## 2018-11-24 ENCOUNTER — Other Ambulatory Visit: Payer: Self-pay

## 2018-11-24 ENCOUNTER — Encounter: Payer: Self-pay | Admitting: Internal Medicine

## 2018-11-24 ENCOUNTER — Telehealth (INDEPENDENT_AMBULATORY_CARE_PROVIDER_SITE_OTHER): Payer: 59 | Admitting: Internal Medicine

## 2018-11-24 VITALS — BP 122/76 | HR 68 | Wt 168.0 lb

## 2018-11-24 DIAGNOSIS — I10 Essential (primary) hypertension: Secondary | ICD-10-CM

## 2018-11-24 DIAGNOSIS — R931 Abnormal findings on diagnostic imaging of heart and coronary circulation: Secondary | ICD-10-CM

## 2018-11-24 DIAGNOSIS — I480 Paroxysmal atrial fibrillation: Secondary | ICD-10-CM

## 2018-11-24 DIAGNOSIS — E785 Hyperlipidemia, unspecified: Secondary | ICD-10-CM

## 2018-11-24 DIAGNOSIS — R002 Palpitations: Secondary | ICD-10-CM

## 2018-11-24 MED ORDER — ROSUVASTATIN CALCIUM 5 MG PO TABS: 5.0000 mg | ORAL_TABLET | Freq: Every day | ORAL | 3 refills | Status: DC

## 2018-11-24 MED ORDER — PANTOPRAZOLE SODIUM 40 MG PO TBEC: 40.0000 mg | DELAYED_RELEASE_TABLET | Freq: Every day | ORAL | 2 refills | Status: DC

## 2018-11-24 MED FILL — PANTOPRAZOLE SOD DR 40 MG T: 40 | 45 days supply | Qty: 45 | Fill #0 | Status: TO

## 2018-11-24 MED FILL — ROSUVASTATIN CALCIUM 5 MG T: 5 | 90 days supply | Qty: 90 | Fill #0 | Status: TO

## 2018-11-24 NOTE — Progress Notes (Signed)
Order placed for 7 day monitor to assess palpitations.

## 2018-11-24 NOTE — Progress Notes (Signed)
Electrophysiology TeleHealth Note   Due to national recommendations of social distancing due to COVID 19, an audio/video telehealth visit is felt to be most appropriate for this patient at this time.  See MyChart message from today for the patient's consent to telehealth for [facility].   Date:  11/28/2018   ID:  Nicholas Perry, DOB [birth year], MRN [id number]  Location: patient's home  Provider location: [address] [place]  Evaluation Performed: Follow-up visit  PCP:  [name], MD  Cardiologist:  Dr Percival Spanish Electrophysiologist:  Dr Rayann Heman  Chief Complaint:  afib  History of Present Illness:    Nicholas Perry is a 68 y.o. male who presents via audio/video conferencing for a telehealth visit today.  Since his afib ablation, the patient reports doing very well.  He remains in sinus rhythm and denies procedure related complications. Today, he denies symptoms of palpitations, chest pain, shortness of breath,  lower extremity edema, dizziness, presyncope, or syncope.  The patient is otherwise without complaint today.  The patient denies symptoms of fevers, chills, cough, or new SOB worrisome for COVID 19.  Past Medical History:  Diagnosis Date  . Hypertension   . Paroxysmal atrial fibrillation (HCC)   . Partial tear of rotator cuff 06/27/2015  . 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 Seizure after head injury ([place]) 12/14/2013  . Shoulder pain, left     Past Surgical History:  Procedure Laterality Date  . ATRIAL FIBRILLATION ABLATION N/A 09/07/2018   Procedure: ATRIAL FIBRILLATION ABLATION;  Surgeon: Thompson Grayer, MD;  Location: [facility];  Service: Cardiovascular;  Laterality: N/A;  . knee scopes Bilateral   . NASAL SEPTUM SURGERY    . right inguinal hernia     child  . SHOULDER ACROMIOPLASTY Left 06/27/2015   Procedure: SHOULDER ACROMIOPLASTY DEBRIDE LABRUM;  Surgeon: [name], MD;  Location: [facility];  Service:  Orthopedics;  Laterality: Left;  . SHOULDER ARTHROSCOPY WITH ROTATOR CUFF REPAIR Left 06/27/2015   Procedure: SHOULDER ARTHROSCOPY WITH ROTATOR CUFF REPAIR;  Surgeon: [name], MD;  Location: [facility];  Service: Orthopedics;  Laterality: Left;  . TONSILECTOMY/ADENOIDECTOMY WITH MYRINGOTOMY     child    Current Outpatient Medications  Medication Sig Dispense Refill  . acetaminophen (TYLENOL) 325 MG tablet Take 650 mg by mouth daily as needed for moderate pain or headache.    . diltiazem (CARDIZEM) 30 MG tablet Take 1 tablet (30 mg total) by mouth 2 (two) times daily as needed (palpitations). 60 tablet 6  . losartan (COZAAR) 25 MG tablet Take 1 tablet (25 mg total) by mouth daily. 30 tablet 6  . pantoprazole (PROTONIX) 40 MG tablet Take 1 tablet (40 mg total) by mouth daily. 45 tablet 2  . rivaroxaban (XARELTO) 20 MG TABS tablet Take 1 tablet (20 mg total) by mouth daily with supper. 90 tablet 3  . rosuvastatin (CRESTOR) 5 MG tablet Take 1 tablet (5 mg total) by mouth daily. 90 tablet 3   No current facility-administered medications for this visit.     Allergies:   Cardura [doxazosin mesylate] and Simvastatin   Social History:  The patient  reports that he has never smoked. He has never used smokeless tobacco. He reports current alcohol use of about 7.0 - 14.0 standard drinks of alcohol per week. He reports that he does not use drugs.   Family History:  The patient's  family history includes CAD (age of onset: 53) in his  father; CAD (age of onset: 42) in his brother; Cancer - Other in his father; Hypertension in his brother.   ROS:  Please see the history of present illness.   All other systems are personally reviewed and negative.    Exam:    Vital Signs:  BP 122/76   Pulse 68   Wt 168 lb (76.2 kg)   BMI 22.16 kg/m   Well appearing, alert and conversant, regular work of breathing,  good skin color Eyes- anicteric, neuro- grossly intact, skin- no apparent rash  or lesions or cyanosis, mouth- oral mucosa is pink   Labs/Other Tests and Data Reviewed:    Recent Labs: 05/04/2018: ALT 27 07/24/2018: Magnesium 2.0; TSH 0.697 08/21/2018: BUN 18; Creatinine, Ser 1.18; Hemoglobin 14.1; Platelets 202; Potassium 4.9; Sodium 142   Wt Readings from Last 3 Encounters:  11/24/18 168 lb (76.2 kg)  10/05/18 171 lb (77.6 kg)  09/07/18 170 lb (77.1 kg)     Other studies personally reviewed: Additional studies/ records that were reviewed today include: procedure note, my prior notes  Review of the above records today demonstrates: as above Prior radiographs: cardiac CT from 09/04/2018 was reviewed at length with the patient today.     ASSESSMENT & PLAN:    1.  Paroxysmal atrial fibrillation and atrial flutter Maintaining isnus rhythm post ablation off AAD therapy chads2vasc score is at least 2.  He is on xarelto  2. HTN Stable No change required today  3. CAD Cardiac CT is reviewed at length with the patient today.  He does not have ischemic symptoms currently.  He wishes for a conservative approach.  We will therefore continue medical management. Will start crestor 5mg  daily today.  He will follow-up with Dr Percival Spanish for additional management.  4. COVID 19 screen The patient denies symptoms of COVID 19 at this time.  The importance of social distancing was discussed today.  Follow-up with Dr Percival Spanish as scheduled, I will see in 6 months  Current medicines are reviewed at length with the patient today.   The patient does not have concerns regarding his medicines.  The following changes were made today:  none  Labs/ tests ordered today include:  No orders of the defined types were placed in this encounter.  Patient Risk:  after full review of this patients clinical status, I feel that they are at moderate risk at this time.  Today, I have spent 25 minutes with the patient with telehealth technology discussing afib .    Army Fossa, MD   11/28/2018 7:44 AM     [address] [place] [address] [phone] (office) [phone] (fax)

## 2018-11-29 ENCOUNTER — Ambulatory Visit: Payer: 59 | Admitting: Internal Medicine

## 2018-11-30 MED FILL — LOSARTAN POTASSIUM 25 MG TA: 25 | 30 days supply | Qty: 30 | Fill #1 | Status: TO

## 2018-12-08 ENCOUNTER — Telehealth: Payer: Self-pay | Admitting: Radiology

## 2018-12-08 NOTE — Telephone Encounter (Signed)
Enrolled patient for a 7 Day Zio long Term monitor to be mailed. I went over brief instructions and patient knows to expect the monitor to arrive in 3-4 days

## 2018-12-14 ENCOUNTER — Ambulatory Visit (INDEPENDENT_AMBULATORY_CARE_PROVIDER_SITE_OTHER): Payer: 59

## 2018-12-14 DIAGNOSIS — R002 Palpitations: Secondary | ICD-10-CM

## 2018-12-28 ENCOUNTER — Telehealth: Payer: Self-pay | Admitting: Cardiology

## 2018-12-28 DIAGNOSIS — R002 Palpitations: Secondary | ICD-10-CM | POA: Diagnosis not present

## 2018-12-28 NOTE — Telephone Encounter (Signed)
call [phone]/ consent/ my chart active/ pre reg completed

## 2018-12-29 ENCOUNTER — Other Ambulatory Visit: Payer: Self-pay

## 2018-12-30 MED FILL — LOSARTAN POTASSIUM 25 MG TA: 25 | 30 days supply | Qty: 30 | Fill #0

## 2018-12-30 MED FILL — PANTOPRAZOLE SOD DR 40 MG T: 40 | 45 days supply | Qty: 45 | Fill #0

## 2018-12-30 MED FILL — XARELTO 20 MG TABLET: 20 | 90 days supply | Qty: 90 | Fill #0
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
## 2019-01-02 ENCOUNTER — Encounter: Payer: Self-pay | Admitting: Cardiology

## 2019-01-02 ENCOUNTER — Telehealth (INDEPENDENT_AMBULATORY_CARE_PROVIDER_SITE_OTHER): Payer: 59 | Admitting: Cardiology

## 2019-01-02 VITALS — BP 126/78 | HR 68 | Ht 73.0 in | Wt 165.0 lb

## 2019-01-02 DIAGNOSIS — I480 Paroxysmal atrial fibrillation: Secondary | ICD-10-CM | POA: Diagnosis not present

## 2019-01-02 DIAGNOSIS — R931 Abnormal findings on diagnostic imaging of heart and coronary circulation: Secondary | ICD-10-CM

## 2019-01-02 DIAGNOSIS — U071 COVID-19: Secondary | ICD-10-CM | POA: Insufficient documentation

## 2019-01-02 MED ORDER — RIVAROXABAN 20 MG PO TABS: 20.0000 mg | ORAL_TABLET | Freq: Every day | ORAL | 3 refills | Status: DC

## 2019-01-02 MED ORDER — LOSARTAN POTASSIUM 25 MG PO TABS: 25.0000 mg | ORAL_TABLET | Freq: Every day | ORAL | 3 refills | Status: DC

## 2019-01-02 NOTE — Patient Instructions (Signed)

## 2019-02-07 ENCOUNTER — Telehealth (INDEPENDENT_AMBULATORY_CARE_PROVIDER_SITE_OTHER): Payer: 59 | Admitting: Internal Medicine

## 2019-02-07 ENCOUNTER — Encounter: Payer: Self-pay | Admitting: Internal Medicine

## 2019-02-07 VITALS — BP 120/62 | HR 58 | Ht 73.0 in | Wt 168.0 lb

## 2019-02-07 DIAGNOSIS — I10 Essential (primary) hypertension: Secondary | ICD-10-CM

## 2019-02-07 DIAGNOSIS — I480 Paroxysmal atrial fibrillation: Secondary | ICD-10-CM

## 2019-02-07 NOTE — Progress Notes (Signed)
Electrophysiology TeleHealth Note   Due to national recommendations of social distancing due to COVID 19, an audio/video telehealth visit is felt to be most appropriate for this patient at this time.  See MyChart message from today for the patient's consent to telehealth for [facility].   Date:  02/07/2019   ID:  Nicholas Perry, DOB 1951-02-22, MRN [id number]  Location: patient's home  Provider location:  [facility]  Evaluation Performed: Follow-up visit  PCP:  [name], MD   Electrophysiologist:  Dr Rayann Heman  Chief Complaint:  palpitations  History of Present Illness:    Nicholas Perry is a 68 y.o. male who presents via telehealth conferencing today.  Since last being seen in our clinic, the patient reports doing very well.  Today, he denies symptoms of palpitations, chest pain, shortness of breath,  lower extremity edema, dizziness, presyncope, or syncope.  The patient is otherwise without complaint today.  The patient denies symptoms of fevers, chills, cough, or new SOB worrisome for COVID 19.  Past Medical History:  Diagnosis Date  . Hypertension   . Paroxysmal atrial fibrillation (HCC)   . Partial tear of rotator cuff 06/27/2015  . 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 Seizure after head injury ([place]) 12/14/2013  . Shoulder pain, left     Past Surgical History:  Procedure Laterality Date  . ATRIAL FIBRILLATION ABLATION N/A 09/07/2018   Procedure: ATRIAL FIBRILLATION ABLATION;  Surgeon: Thompson Grayer, MD;  Location: [place] [facility];  Service: Cardiovascular;  Laterality: N/A;  . knee scopes Bilateral   . NASAL SEPTUM SURGERY    . right inguinal hernia     child  . SHOULDER ACROMIOPLASTY Left 06/27/2015   Procedure: SHOULDER ACROMIOPLASTY DEBRIDE LABRUM;  Surgeon: [name], MD;  Location: [place];  Service: Orthopedics;  Laterality: Left;  . SHOULDER ARTHROSCOPY WITH ROTATOR CUFF REPAIR Left 06/27/2015   Procedure: SHOULDER  ARTHROSCOPY WITH ROTATOR CUFF REPAIR;  Surgeon: [name], MD;  Location: [place];  Service: Orthopedics;  Laterality: Left;  . TONSILECTOMY/ADENOIDECTOMY WITH MYRINGOTOMY     child    Current Outpatient Medications  Medication Sig Dispense Refill  . acetaminophen (TYLENOL) 325 MG tablet Take 650 mg by mouth daily as needed for moderate pain or headache.    . diltiazem (CARDIZEM) 30 MG tablet Take 1 tablet (30 mg total) by mouth 2 (two) times daily as needed (palpitations). 60 tablet 6  . losartan (COZAAR) 25 MG tablet Take 1 tablet (25 mg total) by mouth daily. 90 tablet 3  . rivaroxaban (XARELTO) 20 MG TABS tablet Take 1 tablet (20 mg total) by mouth daily with supper. 90 tablet 3  . rosuvastatin (CRESTOR) 5 MG tablet Take 1 tablet (5 mg total) by mouth daily. 90 tablet 3   No current facility-administered medications for this visit.     Allergies:   Cardura [doxazosin mesylate] and Simvastatin   Social History:  The patient  reports that he has never smoked. He has never used smokeless tobacco. He reports current alcohol use of about 7.0 - 14.0 standard drinks of alcohol per week. He reports that he does not use drugs.   Family History:  The patient's family history includes CAD (age of onset: 74) in his father; CAD (age of onset: 16) in his brother; Cancer - Other in his father; Hypertension in his brother.   ROS:  Please see the history of present illness.   All other systems are personally  reviewed and negative.    Exam:    Vital Signs:  BP 120/62   Pulse (!) 58   Ht 6\' 1"  (1.854 m)   Wt 168 lb (76.2 kg)   BMI 22.16 kg/m   Well appearing, NAD, OP clear, normal WOB today   Labs/Other Tests and Data Reviewed:    Recent Labs: 05/04/2018: ALT 27 07/24/2018: Magnesium 2.0; TSH 0.697 08/21/2018: BUN 18; Creatinine, Ser 1.18; Hemoglobin 14.1; Platelets 202; Potassium 4.9; Sodium 142   Wt Readings from Last 3 Encounters:  02/07/19 168 lb (76.2 kg)   01/02/19 165 lb (74.8 kg)  11/24/18 168 lb (76.2 kg)     ASSESSMENT & PLAN:    1.  Paroxysmal atrial fibrillation/ atrial flutter Doing very well post ablation off AAD No AF on event monitor 12/2018 chads2vasc score is at least 2.  Continue xarelto  2. CAD Followed by Dr Percival Spanish  3. HTN Stable No change required today  Follow-up:  With Dr Percival Spanish in 6 months I will see in a year   Patient Risk:  after full review of this patients clinical status, I feel that they are at moderate risk at this time.  Today, I have spent 15 minutes with the patient with telehealth technology discussing arrhythmia management .    SignedThompson Grayer, MD  02/07/2019 2:11 PM     [place] [id number] [phone] (office) [phone] (fax)

## 2019-02-12 ENCOUNTER — Telehealth: Payer: 59 | Admitting: Internal Medicine

## 2019-02-14 MED FILL — LOSARTAN POTASSIUM 25 MG TA: 25 | 30 days supply | Qty: 30 | Fill #1

## 2019-03-15 MED FILL — LOSARTAN POTASSIUM 25 MG TA: 25 | 90 days supply | Qty: 90 | Fill #0

## 2019-03-27 MED FILL — ROSUVASTATIN CALCIUM 5 MG T: 5 | 90 days supply | Qty: 90 | Fill #0

## 2019-04-05 ENCOUNTER — Ambulatory Visit (INDEPENDENT_AMBULATORY_CARE_PROVIDER_SITE_OTHER): Payer: 59 | Admitting: Family Medicine

## 2019-04-05 ENCOUNTER — Encounter: Payer: Self-pay | Admitting: Family Medicine

## 2019-04-05 VITALS — BP 112/61 | HR 67 | Ht 73.0 in | Wt 171.0 lb

## 2019-04-05 DIAGNOSIS — I480 Paroxysmal atrial fibrillation: Secondary | ICD-10-CM | POA: Diagnosis not present

## 2019-04-05 DIAGNOSIS — Z87898 Personal history of other specified conditions: Secondary | ICD-10-CM | POA: Diagnosis not present

## 2019-04-05 DIAGNOSIS — R5383 Other fatigue: Secondary | ICD-10-CM

## 2019-04-05 NOTE — Patient Instructions (Addendum)
Health Maintenance Due  Topic Date Due  . INFLUENZA VACCINE - scheduled 04/18/19 03/10/2019    Depression screen PHQ 2/9 05/04/2018 04/25/2017  Decreased Interest 0 0  Down, Depressed, Hopeless 0 0  PHQ - 2 Score 0 0

## 2019-04-05 NOTE — Progress Notes (Signed)
Phone [phone]   Subjective:  Virtual visit via Video note. Chief complaint: Chief Complaint  Patient presents with  . Discuss Covid Testing    This visit type was conducted due to national recommendations for restrictions regarding the COVID-19 Pandemic (e.g. social distancing).  This format is felt to be most appropriate for this patient at this time balancing risks to patient and risks to population by having him in for in person visit.  No physical exam was performed (except for noted visual exam or audio findings with Telehealth visits).    Our team/I connected with Nicholas Perry at  2:20 PM EDT by a video enabled telemedicine application (doxy.me or caregility through epic) and verified that I am speaking with the correct person using two identifiers.  Location patient: Home[place] Location provider: [facility], office Persons participating in the virtual visit:  patient  Our team/I discussed the limitations of evaluation and management by telemedicine and the availability of in person appointments. In light of current covid-19 pandemic, patient also understands that we are trying to protect them by minimizing in office contact if at all possible.  The patient expressed consent for telemedicine visit and agreed to proceed. Patient understands insurance will be billed.   ROS-no fever/chills/nausea/vomiting.  No loss of taste or smell.  Does have some mild lingering fatigue since July as well as mild chest tightness-states "would not think anything of it "if not for COVID-19  Past Medical History-  Patient Active Problem List   Diagnosis Date Noted  . 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 Seizure after head injury ([place]) 12/14/2013    Priority: High  . Atrial fibrillation ([place]) 09/22/2009    Priority: High  . BPH (benign prostatic hyperplasia) 07/18/2014    Priority: Medium  . Hyperlipidemia 01/02/2008    Priority: Medium  . Essential hypertension  01/02/2008    Priority: Medium  . Other fatigue 07/24/2018    Priority: Low  . COVID-19 01/02/2019  . Paroxysmal atrial fibrillation ([place]) 09/07/2018  . Elevated coronary artery calcium score 04/28/2017  . Partial tear of rotator cuff 06/27/2015    Medications- reviewed and updated Current Outpatient Medications  Medication Sig Dispense Refill  . acetaminophen (TYLENOL) 325 MG tablet Take 650 mg by mouth daily as needed for moderate pain or headache.    . losartan (COZAAR) 25 MG tablet Take 1 tablet (25 mg total) by mouth daily. 90 tablet 3  . rivaroxaban (XARELTO) 20 MG TABS tablet Take 1 tablet (20 mg total) by mouth daily with supper. 90 tablet 3  . rosuvastatin (CRESTOR) 5 MG tablet Take 1 tablet (5 mg total) by mouth daily. 90 tablet 3  . diltiazem (CARDIZEM) 30 MG tablet Take 1 tablet (30 mg total) by mouth 2 (two) times daily as needed (palpitations). (Patient not taking: Reported on 04/05/2019) 60 tablet 6   No current facility-administered medications for this visit.      Objective:  BP 112/61   Pulse 67   Ht 6\' 1"  (1.854 m)   Wt 171 lb (77.6 kg)   BMI 22.56 kg/m  self reported vitals Gen: NAD, resting comfortably Lungs: nonlabored, normal respiratory rate  Skin: appears dry, no obvious rash    Assessment and Plan   # Discuss Covid Testing/history of fatigue/cough S:His mother is turning 53 and [place] requires a 14 day quarantine before visiting if coming in state from [facility] unless has negative  covid Antibody testing.   Patient does wonder if  he may have been infected in past. Early July - week or two after seeing some family members- had 2-3 days of strong fatigue, loss of appetite, headache. Never had a fever. Very slight cough (thought could be related to allergies) but did feel mild chest tightness.   1.5 months later feels some fatigue and very mild chest tightness- states so minor would not have brought up if not for covid 19. No exertional chest pain or  shortness of breath. Admits to being more sedentary with gyms closed- wonders if a hard workout would eb more difficult.  A/P: Patient with history of cough, significant fatigue, loss of appetite, headache some days after family gathering- I believe COVID-19 testing is reasonable  He does have some mild lingering fatigue as well as very mild chest tightness and occasional cough related to allergies.  Offered nasopharyngeal swab for COVID-19 but he declines.  I think the probability of active covid-19 is extraordinarily low given time course.  He agrees to let us know if new or worsening symptoms.  We also discussed possible chest x-ray- denies exertional chest pain or shortness of breath -He will call to schedule this lab visit-hoping to line this up with Tamiflu shot  #Atrial fibrillation S: Patient did not he would do well with flecainide and eventually had to have an ablation.  He feels like he has done well since that time.  He is compliant with Xarelto 20 mg.  Also has diltiazem available if needed A/P: Patient is doing well status post ablation.  Continue on Xarelto for anticoagulation.  Recommended follow up: Already scheduled for physical in September Future Appointments  Date Time Provider [place]  04/18/2019  3:00 PM [facility] NURSE [facility]  05/07/2019  9:40 AM [name], MD [facility]  02/18/2020  9:30 AM Thompson Grayer, MD [facility]    Lab/Order associations:   ICD-10-CM   1. History of fatigue  Z87.898 SAR CoV2 Serology (COVID 19)AB(IGG)IA  2. Fatigue, unspecified type  R53.83   3. Paroxysmal atrial fibrillation (HCC)  I48.0    Return precautions advised.  Garret Reddish, MD

## 2019-04-17 MED FILL — XARELTO 20 MG TABLET: 20 | 90 days supply | Qty: 90 | Fill #0

## 2019-04-18 ENCOUNTER — Other Ambulatory Visit (INDEPENDENT_AMBULATORY_CARE_PROVIDER_SITE_OTHER): Payer: 59

## 2019-04-18 ENCOUNTER — Other Ambulatory Visit: Payer: Self-pay

## 2019-04-18 ENCOUNTER — Ambulatory Visit (INDEPENDENT_AMBULATORY_CARE_PROVIDER_SITE_OTHER): Payer: 59

## 2019-04-18 DIAGNOSIS — Z87898 Personal history of other specified conditions: Secondary | ICD-10-CM | POA: Diagnosis not present

## 2019-04-18 DIAGNOSIS — Z23 Encounter for immunization: Secondary | ICD-10-CM

## 2019-04-19 LAB — SAR COV2 SEROLOGY (COVID19)AB(IGG),IA: SARS CoV2 AB IGG: NEGATIVE

## 2019-05-07 ENCOUNTER — Encounter: Payer: Self-pay | Admitting: Family Medicine

## 2019-05-07 ENCOUNTER — Other Ambulatory Visit: Payer: Self-pay

## 2019-05-07 ENCOUNTER — Ambulatory Visit (INDEPENDENT_AMBULATORY_CARE_PROVIDER_SITE_OTHER): Payer: 59 | Admitting: Family Medicine

## 2019-05-07 VITALS — BP 130/70 | HR 57 | Temp 98.2°F | Ht 73.0 in | Wt 168.4 lb

## 2019-05-07 DIAGNOSIS — E785 Hyperlipidemia, unspecified: Secondary | ICD-10-CM | POA: Diagnosis not present

## 2019-05-07 DIAGNOSIS — I10 Essential (primary) hypertension: Secondary | ICD-10-CM | POA: Diagnosis not present

## 2019-05-07 DIAGNOSIS — Z0000 Encounter for general adult medical examination without abnormal findings: Secondary | ICD-10-CM | POA: Diagnosis not present

## 2019-05-07 DIAGNOSIS — Z23 Encounter for immunization: Secondary | ICD-10-CM

## 2019-05-07 DIAGNOSIS — Z1211 Encounter for screening for malignant neoplasm of colon: Secondary | ICD-10-CM

## 2019-05-07 DIAGNOSIS — N401 Enlarged prostate with lower urinary tract symptoms: Secondary | ICD-10-CM | POA: Diagnosis not present

## 2019-05-07 DIAGNOSIS — R351 Nocturia: Secondary | ICD-10-CM

## 2019-05-07 DIAGNOSIS — Z125 Encounter for screening for malignant neoplasm of prostate: Secondary | ICD-10-CM

## 2019-05-07 DIAGNOSIS — R561 Post traumatic seizures: Secondary | ICD-10-CM

## 2019-05-07 DIAGNOSIS — M549 Dorsalgia, unspecified: Secondary | ICD-10-CM | POA: Diagnosis not present

## 2019-05-07 DIAGNOSIS — I480 Paroxysmal atrial fibrillation: Secondary | ICD-10-CM | POA: Diagnosis not present

## 2019-05-07 DIAGNOSIS — R931 Abnormal findings on diagnostic imaging of heart and coronary circulation: Secondary | ICD-10-CM

## 2019-05-07 DIAGNOSIS — F0781 Postconcussional syndrome: Secondary | ICD-10-CM | POA: Diagnosis not present

## 2019-05-07 LAB — COMPREHENSIVE METABOLIC PANEL
ALT: 17 U/L (ref 0–53)
AST: 22 U/L (ref 0–37)
Albumin: 4.1 g/dL (ref 3.5–5.2)
Alkaline Phosphatase: 52 U/L (ref 39–117)
BUN: 21 mg/dL (ref 6–23)
CO2: 30 mEq/L (ref 19–32)
Calcium: 9.3 mg/dL (ref 8.4–10.5)
Chloride: 102 mEq/L (ref 96–112)
Creatinine, Ser: 1.23 mg/dL (ref 0.40–1.50)
GFR: 58.7 mL/min — ABNORMAL LOW (ref >60.00)
Glucose, Bld: 84 mg/dL (ref 70–99)
Potassium: 3.8 mEq/L (ref 3.5–5.1)
Sodium: 140 mEq/L (ref 135–145)
Total Bilirubin: 0.8 mg/dL (ref 0.2–1.2)
Total Protein: 6.6 g/dL (ref 6.0–8.3)

## 2019-05-07 LAB — POC URINALSYSI DIPSTICK (AUTOMATED)
Bilirubin, UA: NEGATIVE
Blood, UA: NEGATIVE
Glucose, UA: NEGATIVE
Ketones, UA: NEGATIVE
Leukocytes, UA: NEGATIVE
Nitrite, UA: NEGATIVE
Protein, UA: NEGATIVE
Spec Grav, UA: 1.015 (ref 1.010–1.025)
Urobilinogen, UA: 0.2 E.U./dL
pH, UA: 5.5 (ref 5.0–8.0)

## 2019-05-07 LAB — LIPID PANEL
Cholesterol: 197 mg/dL (ref 0–200)
HDL: 113.1 mg/dL (ref >39.00)
LDL Cholesterol: 73 mg/dL (ref 0–99)
NonHDL: 83.42
Total CHOL/HDL Ratio: 2
Triglycerides: 51 mg/dL (ref 0.0–149.0)
VLDL: 10.2 mg/dL (ref 0.0–40.0)

## 2019-05-07 LAB — CBC
HCT: 42.1 % (ref 39.0–52.0)
Hemoglobin: 14.2 g/dL (ref 13.0–17.0)
MCHC: 33.8 g/dL (ref 30.0–36.0)
MCV: 95.8 fl (ref 78.0–100.0)
Platelets: 155 10*3/uL (ref 150.0–400.0)
RBC: 4.4 Mil/uL (ref 4.22–5.81)
RDW: 13.2 % (ref 11.5–15.5)
WBC: 4.2 10*3/uL (ref 4.0–10.5)

## 2019-05-07 LAB — PSA: PSA: 0.61 ng/mL (ref 0.10–4.00)

## 2019-05-07 LAB — SEDIMENTATION RATE: Sed Rate: 8 mm/hr (ref 0–20)

## 2019-05-07 NOTE — Patient Instructions (Addendum)
Health Maintenance Due  Topic Date Due  . PNA vac Low Risk Adult (2 of 2 - PPSV23)-recommended Pneumovax 23 today and given 05/05/2019   Please drop off pathology report for last 2 colonoscopies-  May also need to drop this off with GI.   We will call you within two weeks about your referral to GI and Dr. Tamala Julian.  If you do not hear within 3 weeks, give [place] a call.    Please stop by lab before you go If you do not have mychart- we will call you about results within 5 business days of [place] receiving them.  If you have mychart- we will send your results within 3 business days of [place] receiving them.  If abnormal or we want to clarify a result, we will call or mychart you to make sure you receive the message.  If you have questions or concerns or don't hear within 5-7 days, please send [place] a message or call us.

## 2019-05-07 NOTE — Addendum Note (Signed)
Addended by: Jasper Loser on: 05/07/2019 10:37 AM   Modules accepted: Orders

## 2019-05-07 NOTE — Progress Notes (Signed)
Phone: [phone]   Subjective:  Patient presents today for their annual physical. Chief complaint-noted.   See problem oriented charting- ROS- full  review of systems was completed and negative  except for: back pain and seasonal allergies  The following were reviewed and entered/updated in epic: Past Medical History:  Diagnosis Date  . Hypertension   . Paroxysmal atrial fibrillation (HCC)   . Partial tear of rotator cuff 06/27/2015  . 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 Seizure after head injury ([place]) 12/14/2013  . Shoulder pain, left    Patient Active Problem List   Diagnosis Date Noted  . 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 Seizure after head injury ([place]) 12/14/2013    Priority: High  . Atrial fibrillation ([place]) 09/22/2009    Priority: High  . BPH (benign prostatic hyperplasia) 07/18/2014    Priority: Medium  . Hyperlipidemia 01/02/2008    Priority: Medium  . Essential hypertension 01/02/2008    Priority: Medium  . Other fatigue 07/24/2018    Priority: Low  . Paroxysmal atrial fibrillation ([place]) 09/07/2018  . Elevated coronary artery calcium score 04/28/2017  . Partial tear of rotator cuff 06/27/2015   Past Surgical History:  Procedure Laterality Date  . ATRIAL FIBRILLATION ABLATION N/A 09/07/2018   Procedure: ATRIAL FIBRILLATION ABLATION;  Surgeon: Thompson Grayer, MD;  Location: [place] [facility];  Service: Cardiovascular;  Laterality: N/A;  . knee scopes Bilateral   . NASAL SEPTUM SURGERY    . right inguinal hernia     child  . SHOULDER ACROMIOPLASTY Left 06/27/2015   Procedure: SHOULDER ACROMIOPLASTY DEBRIDE LABRUM;  Surgeon: [name], MD;  Location: [place];  Service: Orthopedics;  Laterality: Left;  . SHOULDER ARTHROSCOPY WITH ROTATOR CUFF REPAIR Left 06/27/2015   Procedure: SHOULDER ARTHROSCOPY WITH ROTATOR CUFF REPAIR;  Surgeon: [name], MD;  Location: [place];  Service:  Orthopedics;  Laterality: Left;  . TONSILECTOMY/ADENOIDECTOMY WITH MYRINGOTOMY     child    Family History  Problem Relation Age of Onset  . Cancer - Other Father        renal cell metastatic  . CAD Father 63  . Hypertension Brother        smoker  . CAD Brother 15       stent, smoker    Medications- reviewed and updated Current Outpatient Medications  Medication Sig Dispense Refill  . acetaminophen (TYLENOL) 325 MG tablet Take 650 mg by mouth daily as needed for moderate pain or headache.    . diltiazem (CARDIZEM) 30 MG tablet Take 1 tablet (30 mg total) by mouth 2 (two) times daily as needed (palpitations). 60 tablet 6  . losartan (COZAAR) 25 MG tablet Take 1 tablet (25 mg total) by mouth daily. 90 tablet 3  . rivaroxaban (XARELTO) 20 MG TABS tablet Take 1 tablet (20 mg total) by mouth daily with supper. 90 tablet 3  . rosuvastatin (CRESTOR) 5 MG tablet Take 1 tablet (5 mg total) by mouth daily. 90 tablet 3   No current facility-administered medications for this visit.     Allergies-reviewed and updated Allergies  Allergen Reactions  . Cardura [Doxazosin Mesylate]     Caused patient to go into a. Fib.  . Simvastatin     muscle aches    Social History   Social History Narrative   Married (2nd marriage in 2010). 3 children from first marriage. 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   Patient drinks about 1 cup of caffeine daily and occasionally has a soda.    Objective  Objective:  BP 130/70   Pulse (!) 57   Temp 98.2 F (36.8 C)   Ht 6\' 1"  (1.854 m)   Wt  168 lb 6.4 oz (76.4 kg)   SpO2 99%   BMI 22.22 kg/m  Gen: NAD, resting comfortably HEENT: Mucous membranes are moist. Oropharynx normal Neck: no thyromegaly CV: RRR no murmurs rubs or gallops Lungs: CTAB no crackles, wheeze, rhonchi Abdomen: soft/nontender/nondistended/normal bowel sounds. No rebound or guarding.  Ext: no edema Skin: warm, dry Neuro: grossly normal, moves all extremities, PERRLA MSK: right 2nd toe- slightly enlarged at DIP joint- seems somewhat mobile- possible ganglion cyst   Assessment and Plan  68 y.o. male presenting for annual physical.  Health Maintenance counseling: 1. Anticipatory guidance: Patient counseled regarding regular dental exams -q6 months, eye exams -yearly,  avoiding smoking and second hand smoke , limiting alcohol to 2 beverages per day - almost completely stopped.   2. Risk factor reduction:  Advised patient of need for regular exercise and diet rich and fruits and vegetables to reduce risk of heart attack and stroke. Exercise-last year doing some weights about 3 days a week and also walking dog for an hour vigorously- up to 1.5 hours per day- gym is limited due to covid 19 . Diet-reasonably healthy diet pt states his diet is still doing well.  Wt Readings from Last 3 Encounters:  05/07/19 168 lb 6.4 oz (76.4 kg)  04/05/19 171 lb (77.6 kg)  02/07/19 168 lb (76.2 kg)  3. Immunizations/screenings/ancillary studies-recommended Pneumovax 23.  Thanked patient for to getting the flu shot  Immunization History  Administered Date(s) Administered  . Fluad Quad(high Dose 65+) 04/18/2019  . Influenza Split 04/15/2016  . Influenza Whole 04/11/2009  . Influenza, High Dose Seasonal PF 05/04/2018  . Influenza,inj,Quad PF,6+ Mos 04/25/2017  . Influenza-Unspecified 04/23/2014  . Pneumococcal Conjugate-13 05/04/2018  . Td 06/09/2009  . Tdap 12/07/2013  . Zoster 07/18/2014  . Zoster Recombinat (Shingrix) 05/31/2018, 09/27/2018  4. Prostate cancer screening-  low risk prior PSA trend- we will get PSA today.  Nocturia stable once a night.  Prior BPH on exam Lab Results  Component Value Date   PSA 0.57 05/04/2018   PSA 0.70 04/25/2017   PSA 0.67 12/01/2015   5. Colon cancer screening - February 2016 was completed.  There was a note of a polyp but I do not have pathology report.  Done with Dr. Earlean Shawl who suggested 5-year follow-up. Patient has copy- will drop off for [place] or load on mychart- he would like to see if he can get this done this year if possible- referral placed and 150 day reminder for myself also placed to check on progress.  6. Skin cancer screening-no current dermatologist-has had AK's in the past and treated with fluorouracil without recurrence. advised regular sunscreen use. Denies worrisome, changing, or new skin lesions- has likely SK on right cheek- considered cryo or derm referral.  7.  Never smoker  Status of chronic or acute concerns   Atrial fibrillation- patient on Cardizem for rate control as needed only-he has done well after ablation-had an event monitor in May 2020 without atrial fibrillation.  He is on Xarelto for anticoagulation-with chads vasc score of 2. Has been told gets occasional PVCs but no persistent issues  Positive coronary calcium score- patient had a negative stress test.  He was able to do 12 minutes on the treadmill.  Dr. Percival Spanish did not suspect coronary disease.  Negative stress perfusion study in 2017.  He does have mild aortic insufficiency-he will have a repeat echocardiogram in 2 years per Dr. Percival Spanish  HTN: controlled on losartan 25Mg . Doesn't take cardizem regularly- hasnt taken in a long while.   Hyperlipidemia: Hopefully controlled on  Crestor 5Mg -started by Dr. Layla Maw lipid panel today-elevated last year but was not on statin at that time. He has tried this before and didn't get big results so he is not overly optimistic.  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  Patient continues to feel like he can develop a plan of any develops deviation from the plan and has to reset-such as making an early turn on global maps and having a recent map.   Low back pain- has had low back pain since end of January since coming off the table. Perhaps mild occasional pain into groin. Feels more stiff/sore above iliac crests. Has tried back rollers and exercises and nothing seems to help that much. Moderate level pain - seems to be worse when gets up in AM- sore in AM.  - given pain for over 6 months- we discussed possible x-rays- he opted out for now but would like to sit down with Dr. Tamala Julian of sports medicine to discuss further - also wants sed rate- if high would be willing to get x-ray but wants to avoid radiation if possible as has had a lot lately   Also possible ganglion cyst on right 2nd toe- he may ask Dr. Tamala Julian about as well   Recommended follow up: 1 year physical Future Appointments  Date Time Provider [place]  07/09/2019 10:00 AM Minus Breeding, MD [facility]  02/18/2020  9:30 AM Allred, Jeneen Rinks, MD [facility]   Lab/Order associations: fasting   ICD-10-CM   1. Preventative health care  Z00.00 PSA    Lipid Panel    Comprehensive metabolic panel    CBC (no diff)    POCT Urinalysis Dipstick (Automated)    Ambulatory referral to Gastroenterology  2. Hyperlipidemia, unspecified hyperlipidemia type  E78.5 Lipid Panel    Comprehensive metabolic panel    CBC (no diff)    POCT Urinalysis Dipstick (Automated)  3. Screening for prostate cancer  Z12.5 PSA  4. Paroxysmal atrial fibrillation (HCC)  I48.0   5. Post concussive encephalopathy  F07.81   6. Benign prostatic hyperplasia  with nocturia  N40.1    R35.1   7. Essential hypertension  I10   8. Elevated coronary artery calcium score  R93.1   9. Seizure after head injury ([place])  R56.1   10. Back pain, unspecified back location, unspecified back pain laterality, unspecified chronicity  M54.9 Sedimentation rate  Ambulatory referral to Sports Medicine  11. Screen for colon cancer  Z12.11 Ambulatory referral to Gastroenterology    Return precautions advised.  Garret Reddish, MD

## 2019-05-07 NOTE — Addendum Note (Signed)
Addended by: Francis Dowse T on: 05/07/2019 10:46 AM   Modules accepted: Orders

## 2019-05-14 ENCOUNTER — Encounter: Payer: Self-pay | Admitting: Family Medicine

## 2019-05-14 ENCOUNTER — Telehealth: Payer: Self-pay | Admitting: Family Medicine

## 2019-05-14 ENCOUNTER — Ambulatory Visit (INDEPENDENT_AMBULATORY_CARE_PROVIDER_SITE_OTHER): Payer: 59 | Admitting: Family Medicine

## 2019-05-14 ENCOUNTER — Ambulatory Visit (INDEPENDENT_AMBULATORY_CARE_PROVIDER_SITE_OTHER)
Admission: RE | Admit: 2019-05-14 | Discharge: 2019-05-14 | Disposition: A | Discharge status: Home / Self Care | Payer: 59 | Source: Ambulatory Visit | Attending: Family Medicine | Admitting: Family Medicine

## 2019-05-14 ENCOUNTER — Other Ambulatory Visit: Payer: Self-pay

## 2019-05-14 VITALS — BP 120/62 | HR 56 | Ht 73.0 in | Wt 176.0 lb

## 2019-05-14 DIAGNOSIS — M48062 Spinal stenosis, lumbar region with neurogenic claudication: Secondary | ICD-10-CM | POA: Diagnosis not present

## 2019-05-14 DIAGNOSIS — M5136 Other intervertebral disc degeneration, lumbar region: Secondary | ICD-10-CM | POA: Diagnosis not present

## 2019-05-14 DIAGNOSIS — M48061 Spinal stenosis, lumbar region without neurogenic claudication: Secondary | ICD-10-CM | POA: Insufficient documentation

## 2019-05-14 DIAGNOSIS — G8929 Other chronic pain: Secondary | ICD-10-CM

## 2019-05-14 DIAGNOSIS — M545 Low back pain, unspecified: Secondary | ICD-10-CM

## 2019-05-14 MED ORDER — GABAPENTIN 100 MG PO CAPS: 200.0000 mg | ORAL_CAPSULE | Freq: Every day | ORAL | 2 refills | Status: DC

## 2019-05-14 MED FILL — GABAPENTIN 100 MG CAPSULE: 100 | 30 days supply | Qty: 60 | Fill #0

## 2019-05-14 NOTE — Progress Notes (Signed)
[name] [address] [place], [place] [address] Phone: [phone] Subjective:    I'm seeing this patient by the request  of:   [name], MD   CC: Low back pain  [id number]    I, Wendy Poet, LAT, ATC, am serving as scribe for Dr. Hulan Saas  Nicholas Perry is a 68 y.o. male coming in with complaint of low back pain and has been referred by Dr. Yong Channel.  Low back pain started after having a cardio ablation in January 2020.  That pain lasted for 3-4 weeks and has improved somewhat but has remained.  He rates his pain at a 7-8/10 in the AM and then 5-6/10 later in the day.  Pt notes some intermittent radiating pain into his R groin.  He denies any N/T into his B LE and denies any new weakness in his LEs.  Patient did have a CT scan of the lumbar spine back in May 2015 after a huge fall.  Found to have congenital short pedicles noted with degenerative disc disease at L4-L5.  Pt also having pain and swelling in his R 2nd toe after jamming it into an ottoman about a month ago.  He states that he isn't having any current issues w/ his toe but it has flared 2x since his initial injury.1      Past Medical History:  Diagnosis Date  . Hypertension   . Paroxysmal atrial fibrillation (HCC)   . Partial tear of rotator cuff 06/27/2015  . 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 Seizure after head injury ([facility]) 12/14/2013  . Shoulder pain, left    Past Surgical History:  Procedure Laterality Date  . ATRIAL FIBRILLATION ABLATION N/A 09/07/2018   Procedure: ATRIAL FIBRILLATION ABLATION;  Surgeon: Thompson Grayer, MD;  Location: [facility];  Service: Cardiovascular;  Laterality: N/A;  . knee scopes Bilateral   . NASAL SEPTUM SURGERY    . right inguinal hernia     child  . SHOULDER ACROMIOPLASTY Left 06/27/2015   Procedure: SHOULDER ACROMIOPLASTY DEBRIDE LABRUM;  Surgeon: [name], MD;  Location: [facility];  Service:  Orthopedics;  Laterality: Left;  . SHOULDER ARTHROSCOPY WITH ROTATOR CUFF REPAIR Left 06/27/2015   Procedure: SHOULDER ARTHROSCOPY WITH ROTATOR CUFF REPAIR;  Surgeon: [name], MD;  Location: [facility];  Service: Orthopedics;  Laterality: Left;  . TONSILECTOMY/ADENOIDECTOMY WITH MYRINGOTOMY     child   Social History   Socioeconomic History  . Marital status: Married    Spouse name: Not on file  . Number of children: 3  . Years of education: MD  . Highest education level: Not on file  Occupational History  . Occupation: N/A  Social Needs  . Financial resource strain: Not on file  . Food insecurity    Worry: Not on file    Inability: Not on file  . Transportation needs    Medical: Not on file    Non-medical: Not on file  Tobacco Use  . Smoking status: Never Smoker  . Smokeless tobacco: Never Used  Substance and Sexual Activity  . Alcohol use: Yes    Alcohol/week: 7.0 - 14.0 standard drinks    Types: 7 - 14 Standard drinks or equivalent per week    Comment: glass of wine nightly  . Drug use: No  . Sexual activity: Not on file  Lifestyle  . Physical activity    Days per week: Not on file  Minutes per session: Not on file  . Stress: Not on file  Relationships  . Social Herbalist on phone: Not on file    Gets together: Not on file    Attends religious service: Not on file    Active member of club or organization: Not on file    Attends meetings of clubs or organizations: Not on file    Relationship status: Not on file  Other Topics Concern  . Not on file  Social History Narrative   Married (2nd marriage in 2010). 3 children from first marriage. 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   Patient drinks about 1 cup of caffeine daily and occasionally has a soda.    Allergies  Allergen Reactions  . Cardura [Doxazosin Mesylate]     Caused patient to go into a. Fib.  . Simvastatin     muscle aches   Family History  Problem Relation Age of Onset  . Cancer - Other Father        renal cell metastatic  . CAD Father 27  . Hypertension Brother        smoker  . CAD Brother 79       stent, smoker     Current Outpatient Medications (Cardiovascular):  .  diltiazem (CARDIZEM) 30 MG tablet, Take 1 tablet (30 mg total) by mouth 2 (two) times daily as needed (palpitations). .  losartan (COZAAR) 25 MG tablet, Take 1 tablet (25 mg total) by mouth daily. .  rosuvastatin (CRESTOR) 5 MG tablet, Take 1 tablet (5 mg total) by mouth daily.   Current Outpatient Medications (Analgesics):  .  acetaminophen (TYLENOL) 325 MG tablet, Take 650 mg by mouth daily as needed for moderate pain or headache.  Current Outpatient Medications (Hematological):  .  rivaroxaban (XARELTO) 20 MG TABS tablet, Take 1 tablet (20 mg total) by mouth daily with supper.  Current Outpatient Medications (Other):  .  gabapentin (NEURONTIN) 100 MG capsule, Take 2 capsules (200 mg total) by mouth at bedtime.    Past medical history, social, surgical and family history all reviewed in electronic medical record.  No pertanent information unless stated regarding to the chief complaint.   Review of Systems:  No headache, visual changes, nausea, vomiting, diarrhea, constipation, dizziness, abdominal pain, skin rash, fevers, chills, night sweats, weight loss, swollen lymph nodes, body aches, joint swelling,, chest pain, shortness of breath, mood changes.  Positive muscle aches  Objective  Blood pressure 120/62, pulse (!) 56, height 6\' 1"  (1.854 m), weight 176  lb (79.8 kg), SpO2 99 %.    General: No apparent distress alert and oriented x3 mood and affect normal, dressed appropriately.  HEENT: Pupils equal, extraocular movements intact  Respiratory: Patient's speak in full sentences and does not appear short of breath  Cardiovascular: No lower extremity edema, non tender, no erythema  Skin: Warm dry intact with no signs of infection or rash on extremities or on axial skeleton.  Abdomen: Soft nontender  Neuro: Cranial  nerves II through XII are intact, neurovascularly intact in all extremities with 2+ DTRs and 2+ pulses.  Lymph: No lymphadenopathy of posterior or anterior cervical chain or axillae bilaterally.  Gait mild antalgic MSK:  tender with full range of motion and good stability and symmetric strength and tone of shoulders, elbows, wrist, hip, knee and ankles bilaterally.  Patient does have some mild hypertonicity of multiple muscles.  Back exam has loss of lordosis with mild degenerative scoliosis noted.  Tightness significantly with straight leg test bilaterally.  No radicular symptoms.  Worsening pain with extension of the back but does have near full extension.  Limited flexion of only 25 degrees secondary to significant tightness of the hamstrings.  Neurovascularly intact distally.  [address]; 15 additional minutes spent for Therapeutic exercises as stated in above notes.  This included exercises focusing on stretching, strengthening, with significant focus on eccentric aspects.   Long term goals include an improvement in range of motion, strength, endurance as well as avoiding reinjury. Patient's frequency would include in 1-2 times a day, 3-5 times a week for a duration of 6-12 weeks. Low back exercises that included:  Pelvic tilt/bracing instruction to focus on control of the pelvic girdle and lower abdominal muscles  Glute strengthening exercises, focusing on proper firing of the glutes without engaging the low back muscles Proper stretching  techniques for maximum relief for the hamstrings, hip flexors, low back and some rotation where tolerated  Proper technique shown and discussed handout in great detail with ATC.  All questions were discussed and answered.      Impression and Recommendations:     This case required medical decision making of moderate complexity. The above documentation has been reviewed and is accurate and complete Nicholas Pulley, DO       Note: This dictation was prepared with Dragon dictation along with smaller phrase technology. Any transcriptional errors that result from this process are unintentional.

## 2019-05-14 NOTE — Telephone Encounter (Signed)
Called pharmacy to clarify rx.

## 2019-05-14 NOTE — Patient Instructions (Signed)
See me again in 4 weeks  Gaba 200 mg at night  Do HEP   Vit D 2000 IU daily  Tart cherry 1200 at night  Go to X-ray

## 2019-05-14 NOTE — Assessment & Plan Note (Addendum)
Patient does have lumbar spinal stenosis.  Discussed with patient about home exercises and icing regimen.  I believe the patient does have congenital spinal stenosis that is contributing.  Gabapentin given in a low dose.  We will monitor for any type of side effects especially with patient having some difficulty with more of a posttraumatic head injury that is contributing to a lot of difficulty.  Patient given home exercises, work with Product/process development scientist, new x-rays ordered to further evaluate any changes that have occurred over the course of time and advanced imaging would be warranted.  Patient is in agreement with the plan and will follow-up with me again in 4 weeks

## 2019-05-14 NOTE — Telephone Encounter (Signed)
Nicholas Perry, with [facility] pharmacy, calling to request clarification on Gabapentin. Specifically the amount.

## 2019-05-15 ENCOUNTER — Telehealth: Payer: Self-pay | Admitting: Internal Medicine

## 2019-05-15 NOTE — Telephone Encounter (Signed)
Hey Dr. Hilarie Fredrickson- This patient is being referred to [place] from Dr. Yong Channel to be seen at our practice and they are requesting that you be there doctor. I have the endoscopy and colonoscopy reports from where he was seen last and I am going to get them to you for review.

## 2019-05-17 NOTE — Telephone Encounter (Signed)
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  He had a colonoscopy on 09/24/2014 by Dr. Earlie Raveling.  This revealed a right colon polyp which was removed.  This was 3 mm.  Internal hemorrhoids.  Normal ileoscopy.  This polyp was found to be a tubular adenoma without high-grade dysplasia. --Based on these findings recall colonoscopy would be February 2021; please place appropriate recall  He also had an upper endoscopy which was performed by Dr. Earlean Shawl on 02/25/2014.  He had a normal endoscopy.  A Savary dilation was performed to 1718 mm.  Esophageal biopsies were negative for EoE.  Please see if he is having any troublesome GI symptoms at present, if not make him aware of the recall colonoscopy as above.  No recall EGD, future EGD with symptoms  Please let me know if he has any questions

## 2019-05-18 ENCOUNTER — Encounter: Payer: Self-pay | Admitting: Internal Medicine

## 2019-05-18 NOTE — Telephone Encounter (Signed)
Please schedule pt for colon in Nov or Dec, see note below from Dr. Hilarie Fredrickson.

## 2019-05-18 NOTE — Telephone Encounter (Signed)
Sure, can repeat Nov or Dec 2020

## 2019-05-18 NOTE — Telephone Encounter (Signed)
Left message for pt to call back.  Spoke with pt and he is aware of recall 09/2019. States he had discussed this with his PCP and he has met his deductible and wonders if he can possibly have it done prior to the end of the year. Please advise.

## 2019-05-18 NOTE — Telephone Encounter (Signed)
Patient scheduled 11/24 for OV- on Blood Thinner

## 2019-05-21 ENCOUNTER — Telehealth: Payer: Self-pay | Admitting: Internal Medicine

## 2019-05-21 NOTE — Telephone Encounter (Signed)
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 Rayann Heman.  Pt would like to know whether OV is still necessary.

## 2019-05-21 NOTE — Telephone Encounter (Signed)
Pt was scheduled for an OV prior to procedure as pt is on blood thinner. Pt states he has verbal ok from MD to hold blood thinner and wants to know if OV prior to procedure is still necessary. Please advise.

## 2019-05-22 NOTE — Telephone Encounter (Signed)
If we can get that from Dr. Rayann Heman thru the EMR I am okay without seeing him 1st

## 2019-05-23 ENCOUNTER — Telehealth: Payer: Self-pay

## 2019-05-23 NOTE — Telephone Encounter (Signed)
[facility] Pre-operative Risk Assessment     Request for surgical clearance:     Endoscopy Procedure  What type of surgery is being performed?     Colonoscopy  When is this surgery scheduled?     Procedure is not yet scheduled  What type of clearance is required ?  Pharmacy  Are there any medications that need to be held prior to surgery and how long?  Xarelto  Practice name and name of physician performing surgery?      [facility]  What is your office phone and fax number?      Phone- [phone]  Fax574 [phone]  Anesthesia type (None, local, MAC, general) ?       MAC

## 2019-05-23 NOTE — Telephone Encounter (Signed)
Clinical pharmacist to review Xarelto 

## 2019-05-23 NOTE — Telephone Encounter (Signed)
Patient with diagnosis of ATRIAL FIBRILLATION on [place] for anticoagulation.    Procedure: COLONOSCOPY Date of procedure: TBD  CHADS2-VASc score of 2 (HTN, AGE)  CrCl = 66.7 ml/min Platelet count = 155  Per office protocol, patient can hold XARELTO for 2 days prior to procedure.

## 2019-05-23 NOTE — Telephone Encounter (Signed)
Spoke with pt and let him know an encounter has been sent to cardiology to obtain clearance for holding the xarelto. Let him know as soon as we obtain clearance he can be scheduled for a direct colon. Will call pt back once clearance obtained.

## 2019-05-24 ENCOUNTER — Encounter: Payer: Self-pay | Admitting: Internal Medicine

## 2019-05-24 NOTE — Telephone Encounter (Signed)
   Primary Cardiologist: Minus Breeding, MD  The pharmacists have reviewed the chart. Per office protocol, patient can hold Xarelto for 2 days prior to procedure.  I will route this recommendation to the requesting party via Epic fax function and remove from pre-op pool.  Please call with questions.  Rosaria Ferries, PA-C 05/24/2019, 9:07 AM  [facility]  What is your office phone and fax number?       Phone- [phone]  Fax- [phone]

## 2019-05-24 NOTE — Telephone Encounter (Signed)
Received clearance to hold Xarelto for 2 days prior to exam. Left message for pt to call back and schedule previsit and procedure, he does not need an OV.

## 2019-05-28 ENCOUNTER — Ambulatory Visit: Payer: 59 | Admitting: Internal Medicine

## 2019-06-05 ENCOUNTER — Ambulatory Visit (AMBULATORY_SURGERY_CENTER): Payer: Self-pay | Admitting: *Deleted

## 2019-06-05 ENCOUNTER — Encounter: Payer: Self-pay | Admitting: Internal Medicine

## 2019-06-05 ENCOUNTER — Other Ambulatory Visit: Payer: Self-pay

## 2019-06-05 VITALS — Temp 97.3°F | Ht 73.0 in | Wt 171.0 lb

## 2019-06-05 DIAGNOSIS — Z86010 Personal history of colonic polyps: Secondary | ICD-10-CM

## 2019-06-05 MED ORDER — SUPREP BOWEL PREP KIT 17.5-3.13-1.6 GM/177ML PO SOLN: 1.0000 | Freq: Once | ORAL | 0 refills | Status: AC

## 2019-06-05 MED FILL — SUPREP BOWEL PREP KIT: 17.5-3.13-1 | 1 days supply | Qty: 354 | Fill #0

## 2019-06-05 NOTE — Progress Notes (Signed)
No egg or soy allergy known to patient  No issues with past sedation with any surgeries  or procedures, no intubation problems  No diet pills per patient No home 02 use per patient   [place] X 2 DAYS, last dose11/02/2019  Pt denies issues with constipation  A fib or A flutter hx EMM Iinformation given  COVID test scheduled for 06/14/2019 at 1010  Due to the COVID-19 pandemic we are asking patients to follow these guidelines. Please only bring one care partner. Please be aware that your care partner may wait in the car in the parking lot or if they feel like they will be too hot to wait in the car, they may wait in the lobby on the 4th floor. All care partners are required to wear a mask the entire time (we do not have any that we can provide them), they need to practice social distancing, and we will do a Covid check for all patient's and care partners when you arrive. Also we will check their temperature and your temperature. If the care partner waits in their car they need to stay in the parking lot the entire time and we will call them on their cell phone when the patient is ready for discharge so they can bring the car to the front of the building. Also all patient's will need to wear a mask into building.

## 2019-06-12 ENCOUNTER — Other Ambulatory Visit: Payer: Self-pay

## 2019-06-12 ENCOUNTER — Ambulatory Visit (INDEPENDENT_AMBULATORY_CARE_PROVIDER_SITE_OTHER): Payer: 59 | Admitting: Family Medicine

## 2019-06-12 ENCOUNTER — Encounter: Payer: Self-pay | Admitting: Family Medicine

## 2019-06-12 VITALS — BP 110/80 | HR 54 | Ht 73.0 in | Wt 171.0 lb

## 2019-06-12 DIAGNOSIS — M48062 Spinal stenosis, lumbar region with neurogenic claudication: Secondary | ICD-10-CM | POA: Diagnosis not present

## 2019-06-12 DIAGNOSIS — G8929 Other chronic pain: Secondary | ICD-10-CM

## 2019-06-12 DIAGNOSIS — M545 Low back pain, unspecified: Secondary | ICD-10-CM

## 2019-06-12 MED ORDER — VENLAFAXINE HCL ER 37.5 MG PO CP24: 37.5000 mg | ORAL_CAPSULE | Freq: Every day | ORAL | 0 refills | Status: DC

## 2019-06-12 MED FILL — VENLAFAXINE HCL ER 37.5 MG: 37.5 | 30 days supply | Qty: 30 | Fill #0

## 2019-06-12 NOTE — Patient Instructions (Signed)
Good to see you [facility] [phone] Effexor sent in

## 2019-06-12 NOTE — Assessment & Plan Note (Signed)
Continues to have more of a radicular symptoms.  Not making significant progress with conservative therapy at the moment.  Having difficulty with gabapentin.  Was started on Effexor low dose, MRI of the lumbar spine because patient would be a candidate for an epidural but would have to hold on his blood thinner.  Patient is having a colonoscopy next week.  Able to do that fairly regularly.  Follow-up again in 4 to 8 weeks. spent  25 minutes with patient face-to-face and had greater than 50% of counseling including as described above in assessment and plan.

## 2019-06-12 NOTE — Progress Notes (Signed)
[facility] [address] [place], [place] [address] Phone: [phone] Subjective:   I Nicholas Perry am serving as a Education administrator for Dr. Hulan Saas.  I'm seeing this patient by the request  of:    CC: Back pain follow-up  [id number]  Nicholas Perry is a 68 y.o. male coming in with complaint of back pain. Patient states he feels about the same. He is better after exercise but his back stiffens up by the end of the day.  Patient feels like since last visit has some made only 10 degrees or 10% improvement.     Past Medical History:  Diagnosis Date  . GERD (gastroesophageal reflux disease)   . Hyperlipidemia   . Hypertension   . Paroxysmal atrial fibrillation (HCC)   . Partial tear of rotator cuff 06/27/2015  . 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 Seizure after head injury ([facility]) 12/14/2013  . Shoulder pain, left    Past Surgical History:  Procedure Laterality Date  . ATRIAL FIBRILLATION ABLATION N/A 09/07/2018   Procedure: ATRIAL FIBRILLATION ABLATION;  Surgeon: Thompson Grayer, MD;  Location: [facility];  Service: Cardiovascular;  Laterality: N/A;  . BICEPS TENDON REPAIR    . COLONOSCOPY    . knee scopes Bilateral   . NASAL SEPTUM SURGERY    . right inguinal hernia     child  . SHOULDER ACROMIOPLASTY Left 06/27/2015   Procedure: SHOULDER ACROMIOPLASTY DEBRIDE LABRUM;  Surgeon: [name], MD;  Location: [facility];  Service: Orthopedics;  Laterality: Left;  . SHOULDER ARTHROSCOPY WITH ROTATOR CUFF REPAIR Left 06/27/2015   Procedure: SHOULDER ARTHROSCOPY WITH ROTATOR CUFF REPAIR;  Surgeon: [name], MD;  Location: [facility];  Service: Orthopedics;  Laterality: Left;  . TONSILECTOMY/ADENOIDECTOMY WITH MYRINGOTOMY     child  . UPPER GASTROINTESTINAL ENDOSCOPY     Social History   Socioeconomic History  . Marital status: Married    Spouse name: Not on file  . Number of children: 3  . Years of  education: MD  . Highest education level: Not on file  Occupational History  . Occupation: N/A  Social Needs  . Financial resource strain: Not on file  . Food insecurity    Worry: Not on file    Inability: Not on file  . Transportation needs    Medical: Not on file    Non-medical: Not on file  Tobacco Use  . Smoking status: Never Smoker  . Smokeless tobacco: Never Used  Substance and Sexual Activity  . Alcohol use: Yes    Alcohol/week: 7.0 - 14.0 standard drinks    Types: 7 - 14 Standard drinks or equivalent per week    Comment: 1/2 glass a week  . Drug use: No  . Sexual activity: Not on file  Lifestyle  . Physical activity    Days per week: Not on file    Minutes per session: Not on file  . Stress: Not on file  Relationships  . Social Herbalist on phone: Not on file    Gets together: Not on file    Attends religious service: Not on file    Active member of club or organization: Not on file    Attends meetings of clubs or organizations: Not on file    Relationship status: Not on file  Other Topics Concern  . Not on file  Social History Narrative   Married (2nd marriage in 2010). 3 children  from first marriage. 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   Patient drinks about 1 cup of caffeine daily and occasionally has a soda.    Allergies  Allergen Reactions  . Cardura [Doxazosin Mesylate]     Caused patient to go into a. Fib.  . Simvastatin     muscle aches   Family History  Problem Relation Age of Onset  . Cancer - Other Father        renal  cell metastatic  . CAD Father 21  . Hypertension Brother        smoker  . CAD Brother 72       stent, smoker  . Colon cancer Neg Hx   . Colon polyps Neg Hx   . Esophageal cancer Neg Hx   . Stomach cancer Neg Hx   . Rectal cancer Neg Hx      Current Outpatient Medications (Cardiovascular):  .  diltiazem (CARDIZEM) 30 MG tablet, Take 1 tablet (30 mg total) by mouth 2 (two) times daily as needed (palpitations). .  losartan (COZAAR) 25 MG tablet, Take 1 tablet (25 mg total) by mouth daily. .  rosuvastatin (CRESTOR) 5 MG tablet, Take 1 tablet (5 mg total) by mouth daily.   Current Outpatient Medications (Analgesics):  .  acetaminophen (TYLENOL) 325 MG tablet, Take 650 mg by mouth daily as needed for moderate pain or headache.  Current Outpatient Medications (Hematological):  .  rivaroxaban (XARELTO) 20 MG TABS tablet, Take 1 tablet (20 mg total) by mouth daily with supper.  Current Outpatient Medications (Other):  .  gabapentin (NEURONTIN) 100 MG capsule, Take 2 capsules (200 mg total) by mouth at bedtime. Marland Kitchen  venlafaxine XR (EFFEXOR XR) 37.5 MG 24 hr capsule, Take 1 capsule (37.5 mg total) by mouth daily with breakfast.    Past medical history, social, surgical and family history all reviewed in electronic medical record.  No pertanent information unless stated regarding to the chief complaint.   Review of Systems:  No headache, visual changes, nausea, vomiting, diarrhea, constipation, dizziness, abdominal pain, skin rash, fevers, chills, night sweats, weight loss, swollen lymph nodes, body aches, joint swelling, chest pain, shortness of breath, mood changes.  Positive muscle aches  Objective  Blood pressure 110/80, pulse (!) 54, height 6\' 1"  (1.854 m), weight 171 lb (77.6 kg), SpO2 95 %.    General: No apparent distress alert and oriented x3 mood and affect normal, dressed appropriately.  HEENT: Pupils equal, extraocular movements intact  Respiratory: Patient's speak in full  sentences and does not appear short of breath  Cardiovascular: No lower extremity edema, non tender, no erythema  Skin: Warm dry intact with no signs of infection or rash on extremities or on axial skeleton.  Abdomen: Soft nontender  Neuro: Cranial nerves II through XII are intact, neurovascularly intact in all extremities with 2+ DTRs and 2+ pulses.  Lymph: No lymphadenopathy of posterior or anterior cervical chain or axillae bilaterally.  Gait normal with good balance and coordination.  MSK:  tender with limited range of motion and good stability and symmetric strength and tone of shoulders, elbows, wrist, hip, knee and ankles bilaterally.  Back exam severe tightness noted patient does have severe tenderness in the hamstrings.  The patient does have some mild atrophy of the urine thighs bilaterally.  Deep tendon reflexes noted to be intact, patient does have some worsening pain with flexion and extension.   Impression and Recommendations:     This case required medical decision making of moderate complexity. The above documentation has been reviewed and is accurate and complete Lyndal Pulley, DO       Note: This dictation was prepared with Dragon dictation along with smaller phrase technology. Any transcriptional errors that result from this process are unintentional.

## 2019-06-14 ENCOUNTER — Other Ambulatory Visit: Payer: Self-pay | Admitting: Internal Medicine

## 2019-06-14 DIAGNOSIS — Z1159 Encounter for screening for other viral diseases: Secondary | ICD-10-CM | POA: Diagnosis not present

## 2019-06-14 LAB — SARS CORONAVIRUS 2 (TAT 6-24 HRS): SARS Coronavirus 2: NEGATIVE

## 2019-06-18 MED FILL — LOSARTAN POTASSIUM 25 MG TA: 25 | 90 days supply | Qty: 90 | Fill #1

## 2019-06-19 ENCOUNTER — Ambulatory Visit (AMBULATORY_SURGERY_CENTER): Payer: 59 | Admitting: Internal Medicine

## 2019-06-19 ENCOUNTER — Other Ambulatory Visit: Payer: Self-pay

## 2019-06-19 ENCOUNTER — Encounter: Payer: Self-pay | Admitting: Internal Medicine

## 2019-06-19 VITALS — BP 122/73 | HR 48 | Temp 97.9°F | Resp 14 | Ht 73.0 in | Wt 170.0 lb

## 2019-06-19 DIAGNOSIS — I4891 Unspecified atrial fibrillation: Secondary | ICD-10-CM | POA: Diagnosis not present

## 2019-06-19 DIAGNOSIS — D120 Benign neoplasm of cecum: Secondary | ICD-10-CM

## 2019-06-19 DIAGNOSIS — Z86010 Personal history of colonic polyps: Secondary | ICD-10-CM

## 2019-06-19 DIAGNOSIS — E785 Hyperlipidemia, unspecified: Secondary | ICD-10-CM | POA: Diagnosis not present

## 2019-06-19 DIAGNOSIS — Z1211 Encounter for screening for malignant neoplasm of colon: Secondary | ICD-10-CM | POA: Diagnosis not present

## 2019-06-19 DIAGNOSIS — I10 Essential (primary) hypertension: Secondary | ICD-10-CM | POA: Diagnosis not present

## 2019-06-19 MED ORDER — SODIUM CHLORIDE 0.9 % IV SOLN: 500.0000 mL | Freq: Once | INTRAVENOUS | Status: DC

## 2019-06-19 NOTE — Progress Notes (Signed)
Report to PACU, RN, vss, BBS= Clear.  

## 2019-06-19 NOTE — Progress Notes (Signed)
Called to room to assist during endoscopic procedure.  Patient ID and intended procedure confirmed with present staff. Received instructions for my participation in the procedure from the performing physician.  

## 2019-06-19 NOTE — Patient Instructions (Signed)
Please read handouts provided. Continue present medications. Resume Xarelto ( rivaroxaban ) at prior dose tomorrow. Refer to managing physician for further adjustment of therapy. Await pathology results.       YOU HAD AN ENDOSCOPIC PROCEDURE TODAY AT [facility]:   Refer to the procedure report that was given to you for any specific questions about what was found during the examination.  If the procedure report does not answer your questions, please call your gastroenterologist to clarify.  If you requested that your care partner not be given the details of your procedure findings, then the procedure report has been included in a sealed envelope for you to review at your convenience later.  YOU SHOULD EXPECT: Some feelings of bloating in the abdomen. Passage of more gas than usual.  Walking can help get rid of the air that was put into your GI tract during the procedure and reduce the bloating. If you had a lower endoscopy (such as a colonoscopy or flexible sigmoidoscopy) you may notice spotting of blood in your stool or on the toilet paper. If you underwent a bowel prep for your procedure, you may not have a normal bowel movement for a few days.  Please Note:  You might notice some irritation and congestion in your nose or some drainage.  This is from the oxygen used during your procedure.  There is no need for concern and it should clear up in a day or so.  SYMPTOMS TO REPORT IMMEDIATELY:   Following lower endoscopy (colonoscopy or flexible sigmoidoscopy):  Excessive amounts of blood in the stool  Significant tenderness or worsening of abdominal pains  Swelling of the abdomen that is new, acute  Fever of 100F or higher    For urgent or emergent issues, a gastroenterologist can be reached at any hour by calling [phone].   DIET:  We do recommend a small meal at first, but then you may proceed to your regular diet.  Drink plenty of fluids but you should avoid  alcoholic beverages for 24 hours.  ACTIVITY:  You should plan to take it easy for the rest of today and you should NOT DRIVE or use heavy machinery until tomorrow (because of the sedation medicines used during the test).    FOLLOW UP: Our staff will call the number listed on your records 48-72 hours following your procedure to check on you and address any questions or concerns that you may have regarding the information given to you following your procedure. If we do not reach you, we will leave a message.  We will attempt to reach you two times.  During this call, we will ask if you have developed any symptoms of COVID 19. If you develop any symptoms (ie: fever, flu-like symptoms, shortness of breath, cough etc.) before then, please call [phone].  If you test positive for Covid 19 in the 2 weeks post procedure, please call and report this information to [place].    If any biopsies were taken you will be contacted by phone or by letter within the next 1-3 weeks.  Please call us at [phone] if you have not heard about the biopsies in 3 weeks.    SIGNATURES/CONFIDENTIALITY: You and/or your care partner have signed paperwork which will be entered into your electronic medical record.  These signatures attest to the fact that that the information above on your After Visit Summary has been reviewed and is understood.  Full responsibility of the confidentiality of this  discharge information lies with you and/or your care-partner. 

## 2019-06-19 NOTE — Progress Notes (Signed)
Pt's states no medical or surgical changes since previsit or office visit.  JB - temp CW - vitals. 

## 2019-06-19 NOTE — Op Note (Signed)
[place] Patient Name: Nicholas Perry Procedure Date: 06/19/2019 2:24 PM MRN: [id number] Endoscopist: Jerene Bears , MD Age: 68 Referring MD:  Date of Birth: 12-30-1950 Gender: Male Account #: [id number] Procedure:                Colonoscopy Indications:              High risk colon cancer surveillance: Personal                            history of non-advanced adenoma, Last colonoscopy:                            February 2016 Medicines:                Monitored Anesthesia Care Procedure:                Pre-Anesthesia Assessment:                           - Prior to the procedure, a History and Physical                            was performed, and patient medications and                            allergies were reviewed. The patient's tolerance of                            previous anesthesia was also reviewed. The risks                            and benefits of the procedure and the sedation                            options and risks were discussed with the patient.                            All questions were answered, and informed consent                            was obtained. Prior Anticoagulants: The patient has                            taken Xarelto (rivaroxaban), last dose was 2 days                            prior to procedure. ASA Grade Assessment: II - A                            patient with mild systemic disease. After reviewing                            the risks and benefits, the patient was deemed in  satisfactory condition to undergo the procedure.                           After obtaining informed consent, the colonoscope                            was passed under direct vision. Throughout the                            procedure, the patient's blood pressure, pulse, and                            oxygen saturations were monitored continuously. The                            Colonoscope was introduced through the  anus and                            advanced to the cecum, identified by appendiceal                            orifice and ileocecal valve. The colonoscopy was                            performed without difficulty. The patient tolerated                            the procedure well. The quality of the bowel                            preparation was good. The ileocecal valve,                            appendiceal orifice, and rectum were photographed. Scope In: 2:49:13 PM Scope Out: 3:03:09 PM Scope Withdrawal Time: 0 hours 9 minutes 24 seconds  Total Procedure Duration: 0 hours 13 minutes 56 seconds  Findings:                 The digital rectal exam was normal.                           Two sessile polyps were found in the cecum. The                            polyps were 2 to 3 mm in size. These polyps were                            removed with a cold snare. Resection and retrieval                            were complete.                           A few small-mouthed diverticula were found in the  sigmoid colon.                           Small post hemorrhoidal banding scars found in the                            distal rectum.                           Internal hemorrhoids were found during                            retroflexion. The hemorrhoids were small.                           The exam was otherwise without abnormality. Complications:            No immediate complications. Estimated Blood Loss:     Estimated blood loss was minimal. Impression:               - Two 2 to 3 mm polyps in the cecum, removed with a                            cold snare. Resected and retrieved.                           - Minimal diverticulosis in the sigmoid colon.                           - Scarring from prior hemorrhoidal banding in the                            distal rectum.                           - Small internal hemorrhoids.                           -  The examination was otherwise normal. Recommendation:           - Patient has a contact number available for                            emergencies. The signs and symptoms of potential                            delayed complications were discussed with the                            patient. Return to normal activities tomorrow.                            Written discharge instructions were provided to the                            patient.                           -  Resume previous diet.                           - Continue present medications.                           - Resume Xarelto (rivaroxaban) at prior dose                            tomorrow. Refer to managing physician for further                            adjustment of therapy.                           - Await pathology results.                           - Repeat colonoscopy is recommended for                            surveillance. The colonoscopy date will be                            determined after pathology results from today's                            exam become available for review. Jerene Bears, MD 06/19/2019 3:10:39 PM This report has been signed electronically.

## 2019-06-21 ENCOUNTER — Telehealth: Payer: Self-pay

## 2019-06-21 NOTE — Telephone Encounter (Signed)
  Follow up Call-  Call back number 06/19/2019  Post procedure Call Back phone  # [phone]  Permission to leave phone message Yes  Some recent data might be hidden     Patient questions:  Do you have a fever, pain , or abdominal swelling? No. Pain Score  0 *  Have you tolerated food without any problems? Yes.    Have you been able to return to your normal activities? Yes.    Do you have any questions about your discharge instructions: Diet   No. Medications  No. Follow up visit  No.  Do you have questions or concerns about your Care? No.  Actions: * If pain score is 4 or above: No action needed, pain <4.  1. Have you developed a fever since your procedure? no  2.   Have you had an respiratory symptoms (SOB or cough) since your procedure? no  3.   Have you tested positive for COVID 19 since your procedure no  4.   Have you had any family members/close contacts diagnosed with the COVID 19 since your procedure?  no   If yes to any of these questions please route to Joylene John, RN and Alphonsa Gin, Therapist, sports.

## 2019-06-25 ENCOUNTER — Encounter: Payer: Self-pay | Admitting: Internal Medicine

## 2019-06-25 MED FILL — ROSUVASTATIN CALCIUM 5 MG T: 5 | 90 days supply | Qty: 90 | Fill #1

## 2019-06-26 ENCOUNTER — Encounter: Payer: Self-pay | Admitting: Family Medicine

## 2019-06-26 DIAGNOSIS — Z86010 Personal history of colonic polyps: Secondary | ICD-10-CM | POA: Insufficient documentation

## 2019-06-29 ENCOUNTER — Other Ambulatory Visit: Payer: Self-pay

## 2019-06-29 ENCOUNTER — Ambulatory Visit
Admission: RE | Admit: 2019-06-29 | Discharge: 2019-06-29 | Disposition: A | Discharge status: Home / Self Care | Payer: 59 | Source: Ambulatory Visit | Attending: Family Medicine | Admitting: Family Medicine

## 2019-06-29 DIAGNOSIS — M5127 Other intervertebral disc displacement, lumbosacral region: Secondary | ICD-10-CM | POA: Diagnosis not present

## 2019-06-29 DIAGNOSIS — G8929 Other chronic pain: Secondary | ICD-10-CM

## 2019-06-29 DIAGNOSIS — M545 Low back pain, unspecified: Secondary | ICD-10-CM

## 2019-06-29 DIAGNOSIS — M47816 Spondylosis without myelopathy or radiculopathy, lumbar region: Secondary | ICD-10-CM | POA: Diagnosis not present

## 2019-07-02 ENCOUNTER — Telehealth: Payer: Self-pay

## 2019-07-02 ENCOUNTER — Encounter: Payer: Self-pay | Admitting: Family Medicine

## 2019-07-02 ENCOUNTER — Other Ambulatory Visit: Payer: Self-pay

## 2019-07-02 DIAGNOSIS — M2550 Pain in unspecified joint: Secondary | ICD-10-CM

## 2019-07-02 NOTE — Telephone Encounter (Signed)
Spoke with patient per Dr. Thompson Caul recommendations. All let patient know that the orders are in for labwork. Patient is likely going to wait until next week to get labs drawn.

## 2019-07-03 ENCOUNTER — Ambulatory Visit: Payer: 59 | Admitting: Internal Medicine

## 2019-07-04 ENCOUNTER — Other Ambulatory Visit (INDEPENDENT_AMBULATORY_CARE_PROVIDER_SITE_OTHER): Payer: 59

## 2019-07-04 DIAGNOSIS — M2550 Pain in unspecified joint: Secondary | ICD-10-CM | POA: Diagnosis not present

## 2019-07-04 LAB — CBC WITH DIFFERENTIAL/PLATELET
Basophils Absolute: 0 10*3/uL (ref 0.0–0.1)
Basophils Relative: 0.6 % (ref 0.0–3.0)
Eosinophils Absolute: 0.1 10*3/uL (ref 0.0–0.7)
Eosinophils Relative: 2.2 % (ref 0.0–5.0)
HCT: 43.5 % (ref 39.0–52.0)
Hemoglobin: 14.4 g/dL (ref 13.0–17.0)
Lymphocytes Relative: 19.7 % (ref 12.0–46.0)
Lymphs Abs: 0.9 10*3/uL (ref 0.7–4.0)
MCHC: 33.1 g/dL (ref 30.0–36.0)
MCV: 95.3 fl (ref 78.0–100.0)
Monocytes Absolute: 0.6 10*3/uL (ref 0.1–1.0)
Monocytes Relative: 12 % (ref 3.0–12.0)
Neutro Abs: 3.2 10*3/uL (ref 1.4–7.7)
Neutrophils Relative %: 65.5 % (ref 43.0–77.0)
Platelets: 177 10*3/uL (ref 150.0–400.0)
RBC: 4.57 Mil/uL (ref 4.22–5.81)
RDW: 13.9 % (ref 11.5–15.5)
WBC: 4.8 10*3/uL (ref 4.0–10.5)

## 2019-07-04 LAB — COMPREHENSIVE METABOLIC PANEL
ALT: 21 U/L (ref 0–53)
AST: 24 U/L (ref 0–37)
Albumin: 4 g/dL (ref 3.5–5.2)
Alkaline Phosphatase: 58 U/L (ref 39–117)
BUN: 23 mg/dL (ref 6–23)
CO2: 30 mEq/L (ref 19–32)
Calcium: 9.5 mg/dL (ref 8.4–10.5)
Chloride: 106 mEq/L (ref 96–112)
Creatinine, Ser: 1.1 mg/dL (ref 0.40–1.50)
GFR: 66.75 mL/min (ref >60.00)
Glucose, Bld: 53 mg/dL — ABNORMAL LOW (ref 70–99)
Potassium: 4.3 mEq/L (ref 3.5–5.1)
Sodium: 143 mEq/L (ref 135–145)
Total Bilirubin: 0.8 mg/dL (ref 0.2–1.2)
Total Protein: 7.1 g/dL (ref 6.0–8.3)

## 2019-07-04 LAB — C-REACTIVE PROTEIN: CRP: <1 mg/dL (ref 0.5–20.0)

## 2019-07-04 LAB — VITAMIN D 25 HYDROXY (VIT D DEFICIENCY, FRACTURES): VITD: 34.35 ng/mL (ref 30.00–100.00)

## 2019-07-04 LAB — TESTOSTERONE: Testosterone: 246 ng/dL — ABNORMAL LOW (ref 300.00–890.00)

## 2019-07-04 LAB — IBC PANEL
Iron: 111 ug/dL (ref 42–165)
Saturation Ratios: 34 % (ref 20.0–50.0)
Transferrin: 233 mg/dL (ref 212.0–360.0)

## 2019-07-04 LAB — SEDIMENTATION RATE: Sed Rate: 6 mm/hr (ref 0–20)

## 2019-07-04 LAB — TSH: TSH: 0.4 u[IU]/mL (ref 0.35–4.50)

## 2019-07-04 LAB — FERRITIN: Ferritin: 136.7 ng/mL (ref 22.0–322.0)

## 2019-07-04 LAB — URIC ACID: Uric Acid, Serum: 6 mg/dL (ref 4.0–7.8)

## 2019-07-07 DIAGNOSIS — I351 Nonrheumatic aortic (valve) insufficiency: Secondary | ICD-10-CM | POA: Insufficient documentation
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
## 2019-07-09 ENCOUNTER — Encounter: Payer: Self-pay | Admitting: Cardiology

## 2019-07-09 ENCOUNTER — Ambulatory Visit (INDEPENDENT_AMBULATORY_CARE_PROVIDER_SITE_OTHER): Payer: 59 | Admitting: Cardiology

## 2019-07-09 ENCOUNTER — Other Ambulatory Visit: Payer: Self-pay

## 2019-07-09 VITALS — BP 118/74 | HR 65 | Ht 73.0 in | Wt 172.4 lb

## 2019-07-09 DIAGNOSIS — I480 Paroxysmal atrial fibrillation: Secondary | ICD-10-CM

## 2019-07-09 DIAGNOSIS — E785 Hyperlipidemia, unspecified: Secondary | ICD-10-CM

## 2019-07-09 DIAGNOSIS — I10 Essential (primary) hypertension: Secondary | ICD-10-CM

## 2019-07-09 DIAGNOSIS — I351 Nonrheumatic aortic (valve) insufficiency: Secondary | ICD-10-CM | POA: Diagnosis not present

## 2019-07-09 DIAGNOSIS — R931 Abnormal findings on diagnostic imaging of heart and coronary circulation: Secondary | ICD-10-CM

## 2019-07-09 LAB — PTH, INTACT AND CALCIUM
Calcium: 9.6 mg/dL (ref 8.6–10.3)
PTH: 16 pg/mL (ref 14–64)

## 2019-07-09 LAB — ANA: Anti Nuclear Antibody (ANA): NEGATIVE

## 2019-07-09 LAB — CYCLIC CITRUL PEPTIDE ANTIBODY, IGG: Cyclic Citrullin Peptide Ab: <16 UNITS

## 2019-07-09 LAB — RHEUMATOID FACTOR: Rheumatoid fact SerPl-aCnc: <14 IU/mL (ref <14)

## 2019-07-09 LAB — CALCIUM, IONIZED: Calcium, Ion: 5.32 mg/dL (ref 4.8–5.6)

## 2019-07-09 LAB — ANGIOTENSIN CONVERTING ENZYME: Angiotensin-Converting Enzyme: 13 U/L (ref 9–67)

## 2019-07-09 MED ORDER — RIVAROXABAN 20 MG PO TABS: 20.0000 mg | ORAL_TABLET | Freq: Every day | ORAL | 3 refills | Status: DC

## 2019-07-09 MED FILL — XARELTO 20 MG TABLET: 20 | 90 days supply | Qty: 90 | Fill #0

## 2019-07-09 NOTE — Patient Instructions (Signed)
Medication Instructions:  Your physician recommends that you continue on your current medications as directed. Please refer to the Current Medication list given to you today.  If you need a refill on your cardiac medications before your next appointment, please call your pharmacy.   Lab work: NONE  Testing/Procedures: NONE  Follow-Up: At [facility], you and your health needs are our priority.  As part of our continuing mission to provide you with exceptional heart care, we have created designated Provider Care Teams.  These Care Teams include your primary Cardiologist (physician) and Advanced Practice Providers (APPs -  Physician Assistants and Nurse Practitioners) who all work together to provide you with the care you need, when you need it. You may see Minus Breeding, MD or one of the following Advanced Practice Providers on your designated Care Team:    Rosaria Ferries, PA-C  Jory Sims, DNP, ANP  Cadence Kathlen Mody, NP  Your physician wants you to follow-up in: 1 year. You will receive a reminder letter in the mail two months in advance. If you don't receive a letter, please call our office to schedule the follow-up appointment.

## 2019-07-11 ENCOUNTER — Encounter: Payer: Self-pay | Admitting: Family Medicine

## 2019-07-23 DIAGNOSIS — H40013 Open angle with borderline findings, low risk, bilateral: Secondary | ICD-10-CM | POA: Diagnosis not present

## 2019-07-31 ENCOUNTER — Ambulatory Visit (INDEPENDENT_AMBULATORY_CARE_PROVIDER_SITE_OTHER): Payer: 59 | Admitting: Family Medicine

## 2019-07-31 ENCOUNTER — Encounter: Payer: Self-pay | Admitting: Family Medicine

## 2019-07-31 ENCOUNTER — Other Ambulatory Visit: Payer: Self-pay

## 2019-07-31 DIAGNOSIS — M48062 Spinal stenosis, lumbar region with neurogenic claudication: Secondary | ICD-10-CM | POA: Diagnosis not present

## 2019-07-31 DIAGNOSIS — M999 Biomechanical lesion, unspecified: Secondary | ICD-10-CM | POA: Diagnosis not present

## 2019-07-31 MED ORDER — VITAMIN D (ERGOCALCIFEROL) 1.25 MG (50000 UNIT) PO CAPS: 50000.0000 [IU] | ORAL_CAPSULE | ORAL | 0 refills | Status: DC

## 2019-07-31 MED ORDER — MELOXICAM 7.5 MG PO TABS: 7.5000 mg | ORAL_TABLET | Freq: Two times a day (BID) | ORAL | 3 refills | Status: DC

## 2019-07-31 MED ORDER — VENLAFAXINE HCL ER 37.5 MG PO CP24: 75.0000 mg | ORAL_CAPSULE | Freq: Every day | ORAL | 3 refills | Status: DC

## 2019-07-31 MED FILL — MELOXICAM 7.5 MG TABLET: 7.5 | 30 days supply | Qty: 60 | Fill #0

## 2019-07-31 MED FILL — VIT D2 1.25 MG (50,000 UNIT: 1.25 MG | 84 days supply | Qty: 12 | Fill #0

## 2019-07-31 MED FILL — VENLAFAXINE HCL ER 37.5 MG: 37.5 | 30 days supply | Qty: 60 | Fill #0

## 2019-07-31 NOTE — Assessment & Plan Note (Signed)
Decision today to treat with OMT was based on Physical Exam  After verbal consent patient was treated with HVLA, ME, FPR techniques in cervical, thoracic, lumbar and sacral areas  Patient tolerated the procedure well with improvement in symptoms  Patient given exercises, stretches and lifestyle modifications  See medications in patient instructions if given  Patient will follow up in 4-8 weeks 

## 2019-07-31 NOTE — Assessment & Plan Note (Signed)
Doing relatively well overall.  Discussed with activities to do which was avoid.  Discussed icing regimen and home exercise, which activities that I think will be beneficial.  Patient will increase activity as tolerated.  Follow-up again 1 week for another manipulation and then likely 4 weeks after that.  Increased Effexor to 75 mg and we will see how patient responds.

## 2019-07-31 NOTE — Progress Notes (Signed)
[facility] [place], [place] [address] Phone: [phone] Subjective:   I Nicholas Perry am serving as a Education administrator for Dr. Hulan Saas.  This visit occurred during the SARS-CoV-2 public health emergency.  Safety protocols were in place, including screening questions prior to the visit, additional usage of staff PPE, and extensive cleaning of exam room while observing appropriate contact time as indicated for disinfecting solutions.     CC: Low back pain follow-up  [id number]  Nicholas Perry is a 68 y.o. male coming in with complaint of back pain. Patient states he is slowly improving. Effexor refill.  Patient is making improvement at the moment.  Patient states his symptoms are better.  Has had some improvement in pain but is having still some fatigue issues.     Past Medical History:  Diagnosis Date  . GERD (gastroesophageal reflux disease)   . Hyperlipidemia   . Hypertension   . Paroxysmal atrial fibrillation (HCC)   . Partial tear of rotator cuff 06/27/2015  . 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 Seizure after head injury ([facility]) 12/14/2013  . Shoulder pain, left    Past Surgical History:  Procedure Laterality Date  . ATRIAL FIBRILLATION ABLATION N/A 09/07/2018   Procedure: ATRIAL FIBRILLATION ABLATION;  Surgeon: Thompson Grayer, MD;  Location: [facility];  Service: Cardiovascular;  Laterality: N/A;  . BICEPS TENDON REPAIR    . COLONOSCOPY    . knee scopes Bilateral   . NASAL SEPTUM SURGERY    . right inguinal hernia     child  . SHOULDER ACROMIOPLASTY Left 06/27/2015   Procedure: SHOULDER ACROMIOPLASTY DEBRIDE LABRUM;  Surgeon: [name], MD;  Location: [facility];  Service: Orthopedics;  Laterality: Left;  . SHOULDER ARTHROSCOPY WITH ROTATOR CUFF REPAIR Left 06/27/2015   Procedure: SHOULDER ARTHROSCOPY WITH ROTATOR CUFF REPAIR;  Surgeon: [name], MD;  Location: [facility];  Service:  Orthopedics;  Laterality: Left;  . TONSILECTOMY/ADENOIDECTOMY WITH MYRINGOTOMY     child  . UPPER GASTROINTESTINAL ENDOSCOPY     Social History   Socioeconomic History  . Marital status: Married    Spouse name: Not on file  . Number of children: 3  . Years of education: MD  . Highest education level: Not on file  Occupational History  . Occupation: N/A  Tobacco Use  . Smoking status: Never Smoker  . Smokeless tobacco: Never Used  Substance and Sexual Activity  . Alcohol use: Yes    Alcohol/week: 7.0 - 14.0 standard drinks    Types: 7 - 14 Standard drinks or equivalent per week    Comment: 1/2 glass a week  . Drug use: No  . Sexual activity: Not on file  Other Topics Concern  . Not on file  Social History Narrative   Married (2nd marriage in 2010). 3 children from first marriage. 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   Patient drinks about 1 cup of caffeine daily and occasionally has a  soda.    Social Determinants of Health   Financial Resource Strain:   . Difficulty of Paying Living Expenses: Not on file  Food Insecurity:   . Worried About Charity fundraiser in the Last Year: Not on file  . Ran Out of Food in the Last Year: Not on file  Transportation Needs:   . Lack of Transportation (Medical): Not on file  . Lack of Transportation (Non-Medical): Not on file  Physical Activity:   . Days of Exercise per Week: Not on file  . Minutes of Exercise per Session: Not on file  Stress:   . Feeling of Stress : Not on file  Social Connections:   . Frequency of  Communication with Friends and Family: Not on file  . Frequency of Social Gatherings with Friends and Family: Not on file  . Attends Religious Services: Not on file  . Active Member of Clubs or Organizations: Not on file  . Attends Archivist Meetings: Not on file  . Marital Status: Not on file   Allergies  Allergen Reactions  . Cardura [Doxazosin Mesylate]     Caused patient to go into a. Fib.  . Simvastatin     muscle aches   Family History  Problem Relation Age of Onset  . Cancer - Other Father        renal cell metastatic  . CAD Father 56  . Hypertension Brother        smoker  . CAD Brother 51       stent, smoker  . Colon cancer Neg Hx   . Colon polyps Neg Hx   . Esophageal cancer Neg Hx   . Stomach cancer Neg Hx   . Rectal cancer Neg Hx      Current Outpatient Medications (Cardiovascular):  .  losartan (COZAAR) 25 MG tablet, Take 1 tablet (25 mg total) by mouth daily. .  rosuvastatin (CRESTOR) 5 MG tablet, Take 1 tablet (5 mg total) by mouth daily.    Current Outpatient Medications (Hematological):  .  rivaroxaban (XARELTO) 20 MG TABS tablet, Take 1 tablet (20 mg total) by mouth daily with supper.  Current Outpatient Medications (Other):  .  venlafaxine XR (EFFEXOR XR) 37.5 MG 24 hr capsule, Take 2 capsules (75 mg total) by mouth daily with breakfast. Please keep with the 37.5 mg capsules .  Vitamin D, Ergocalciferol, (DRISDOL) 1.25 MG (50000 UT) CAPS capsule, Take 1 capsule (50,000 Units total) by mouth every 7 (seven) days.    Past medical history, social, surgical and family history all reviewed in electronic medical record.  No pertanent information unless stated regarding to the chief complaint.   Review of Systems:  No headache, visual changes, nausea, vomiting, diarrhea, constipation, dizziness, abdominal pain, skin rash, fevers, chills, night sweats, weight loss, swollen lymph nodes, body aches, joint swelling, , chest pain, shortness of breath,  mood changes.  Positive muscle aches  Objective  Blood pressure 110/62, pulse (!) 54, height 6\' 1"  (1.854 m), weight 173 lb (78.5 kg), SpO2 92 %.    General: No apparent distress alert and oriented x3 mood and affect normal, dressed appropriately.  HEENT: Pupils equal, extraocular movements intact  Respiratory: Patient's speak in full sentences and does not appear short of breath  Cardiovascular: No lower extremity edema, non tender, no erythema  Skin: Warm dry intact with no signs of infection or rash on extremities or on axial skeleton.  Abdomen: Soft nontender  Neuro: Cranial nerves II through  XII are intact, neurovascularly intact in all extremities with 2+ DTRs and 2+ pulses.  Lymph: No lymphadenopathy of posterior or anterior cervical chain or axillae bilaterally.  Gait normal with good balance and coordination.  MSK:  Non tender with full range of motion and good stability and symmetric strength and tone of shoulders, elbows, wrist, hip, knee and ankles bilaterally.  Back exam does have some loss of lordosis.  Patient does have some tenderness to palpation in the paraspinal musculature lumbar spine right greater than left.  Has a negative straight leg raise but does have significant tightness of the hamstrings bilaterally.  Osteopathic findings  T8 extended rotated and side bent left L5 flexed rotated and side bent left  Sacrum right on right      Impression and Recommendations:     This case required medical decision making of moderate complexity. The above documentation has been reviewed and is accurate and complete Lyndal Pulley, DO       Note: This dictation was prepared with Dragon dictation along with smaller phrase technology. Any transcriptional errors that result from this process are unintentional.

## 2019-07-31 NOTE — Patient Instructions (Addendum)
Good to see you.  Keep 7.5 2 pills a day  Once weekly vitamin D See me next week

## 2019-08-08 ENCOUNTER — Other Ambulatory Visit: Payer: Self-pay

## 2019-08-08 ENCOUNTER — Encounter: Payer: Self-pay | Admitting: Family Medicine

## 2019-08-08 ENCOUNTER — Ambulatory Visit (INDEPENDENT_AMBULATORY_CARE_PROVIDER_SITE_OTHER): Payer: 59 | Admitting: Family Medicine

## 2019-08-08 VITALS — BP 130/82 | HR 55 | Ht 73.0 in | Wt 173.0 lb

## 2019-08-08 DIAGNOSIS — M999 Biomechanical lesion, unspecified: Secondary | ICD-10-CM | POA: Diagnosis not present

## 2019-08-08 DIAGNOSIS — M48062 Spinal stenosis, lumbar region with neurogenic claudication: Secondary | ICD-10-CM | POA: Diagnosis not present

## 2019-08-08 NOTE — Assessment & Plan Note (Signed)
Decision today to treat with OMT was based on Physical Exam  After verbal consent patient was treated with HVLA, ME, FPR techniques in  thoracic, lumbar and sacral areas  Patient tolerated the procedure well with improvement in symptoms  Patient given exercises, stretches and lifestyle modifications  See medications in patient instructions if given  Patient will follow up in 4 weeks 

## 2019-08-08 NOTE — Progress Notes (Signed)
[facility] [address] [place] Phone: [phone] Subjective:   I Nicholas Perry am serving as a Education administrator for Dr. Hulan Saas.  This visit occurred during the SARS-CoV-2 public health emergency.  Safety protocols were in place, including screening questions prior to the visit, additional usage of staff PPE, and extensive cleaning of exam room while observing appropriate contact time as indicated for disinfecting solutions.    CC: Low back pain follow-up  [id number]  Nicholas Perry is a 68 y.o. male coming in with complaint of back pain. Patient states he is doing well. OMT.   Seen last week.  Increased Effexor to 75 mg.  Having some mild hangover feeling in the mornings.  Potentially increase in vivid dreams.  Patient has not noticed any more improvement in the back but no worsening.  Also started once weekly vitamin D with no side effect so far.     Past Medical History:  Diagnosis Date  . GERD (gastroesophageal reflux disease)   . Hyperlipidemia   . Hypertension   . Paroxysmal atrial fibrillation (HCC)   . Partial tear of rotator cuff 06/27/2015  . 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 Seizure after head injury ([facility]) 12/14/2013  . Shoulder pain, left    Past Surgical History:  Procedure Laterality Date  . ATRIAL FIBRILLATION ABLATION N/A 09/07/2018   Procedure: ATRIAL FIBRILLATION ABLATION;  Surgeon: Thompson Grayer, MD;  Location: [facility];  Service: Cardiovascular;  Laterality: N/A;  . BICEPS TENDON REPAIR    . COLONOSCOPY    . knee scopes Bilateral   . NASAL SEPTUM SURGERY    . right inguinal hernia     child  . SHOULDER ACROMIOPLASTY Left 06/27/2015   Procedure: SHOULDER ACROMIOPLASTY DEBRIDE LABRUM;  Surgeon: [name], MD;  Location: [facility];  Service: Orthopedics;  Laterality: Left;  . SHOULDER ARTHROSCOPY WITH ROTATOR CUFF REPAIR Left 06/27/2015   Procedure: SHOULDER ARTHROSCOPY WITH  ROTATOR CUFF REPAIR;  Surgeon: [name], MD;  Location: [facility];  Service: Orthopedics;  Laterality: Left;  . TONSILECTOMY/ADENOIDECTOMY WITH MYRINGOTOMY     child  . UPPER GASTROINTESTINAL ENDOSCOPY     Social History   Socioeconomic History  . Marital status: Married    Spouse name: Not on file  . Number of children: 3  . Years of education: MD  . Highest education level: Not on file  Occupational History  . Occupation: N/A  Tobacco Use  . Smoking status: Never Smoker  . Smokeless tobacco: Never Used  Substance and Sexual Activity  . Alcohol use: Yes    Alcohol/week: 7.0 - 14.0 standard drinks    Types: 7 - 14 Standard drinks or equivalent per week    Comment: 1/2 glass a week  . Drug use: No  . Sexual activity: Not on file  Other Topics Concern  . Not on file  Social History Narrative   Married (2nd marriage in 2010). 3 children from first marriage. 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   Patient drinks about 1 cup of caffeine daily and occasionally has a soda.    Social Determinants of Health   Financial Resource Strain:   . Difficulty of Paying Living Expenses: Not on file  Food Insecurity:   . Worried About Charity fundraiser in the Last Year: Not on file  . Ran Out of Food in the Last Year: Not on file  Transportation Needs:   . Lack of Transportation (Medical): Not on file  . Lack of Transportation (Non-Medical): Not on file  Physical Activity:   . Days of Exercise per Week: Not on file  . Minutes of Exercise per Session: Not on  file  Stress:   . Feeling of Stress : Not on file  Social Connections:   . Frequency of Communication with Friends and Family: Not on file  . Frequency of Social Gatherings with Friends and Family: Not on file  . Attends Religious Services: Not on file  . Active Member of Clubs or Organizations: Not on file  . Attends Archivist Meetings: Not on file  . Marital Status: Not on file   Allergies  Allergen Reactions  . Cardura [Doxazosin Mesylate]     Caused patient to go into a. Fib.  . Simvastatin     muscle aches   Family History  Problem Relation Age of Onset  . Cancer - Other Father        renal cell metastatic  . CAD Father 61  . Hypertension Brother        smoker  . CAD Brother 19       stent, smoker  . Colon cancer Neg Hx   . Colon polyps Neg Hx   . Esophageal cancer Neg Hx   . Stomach cancer Neg Hx   . Rectal cancer Neg Hx      Current Outpatient Medications (Cardiovascular):  .  losartan (COZAAR) 25 MG tablet, Take 1 tablet (25 mg total) by mouth daily. .  rosuvastatin (CRESTOR) 5 MG tablet, Take 1 tablet (5 mg total) by mouth daily.    Current Outpatient Medications (Hematological):  .  rivaroxaban (XARELTO) 20 MG TABS tablet, Take 1 tablet (20 mg total) by mouth daily with supper.  Current Outpatient Medications (Other):  .  venlafaxine XR (EFFEXOR XR) 37.5 MG 24 hr capsule, Take 2 capsules (75 mg total) by mouth daily with breakfast. Please keep with the 37.5 mg capsules .  Vitamin D, Ergocalciferol, (DRISDOL) 1.25 MG (50000 UT) CAPS capsule, Take 1 capsule (50,000 Units total) by mouth every 7 (seven) days.    Past medical history, social, surgical and family history all reviewed in electronic medical record.  No pertanent information unless stated regarding to the chief complaint.   Review of Systems:  No headache, visual changes, nausea, vomiting, diarrhea, constipation, dizziness, abdominal pain, skin rash, fevers, chills, night sweats,  weight loss, swollen lymph nodes, body aches, joint swelling,  chest pain, shortness of breath, mood changes.  Mild positive muscle aches  Objective  Blood pressure 130/82, pulse (!) 55, height 6\' 1"  (1.854 m), weight 173 lb (78.5 kg), SpO2 95 %.    General: No apparent distress alert and oriented x3 mood and affect normal, dressed appropriately.  HEENT: Pupils equal, extraocular movements intact  Respiratory: Patient's speak in full sentences and does not appear short of breath  Cardiovascular: No lower extremity edema, non tender, no erythema  Skin: Warm dry intact with no signs of  infection or rash on extremities or on axial skeleton.  Abdomen: Soft nontender  Neuro: Cranial nerves II through XII are intact, neurovascularly intact in all extremities with 2+ DTRs and 2+ pulses.  Lymph: No lymphadenopathy of posterior or anterior cervical chain or axillae bilaterally.  Gait normal with good balance and coordination.  MSK:  Non tender with full range of motion and good stability and symmetric strength and tone of shoulders, elbows, wrist, hip, knee and ankles bilaterally.  Low back exam does have some loss of lordosis.  Patient is tender to palpation more in the paraspinal musculature lumbar spine.  Mild tightness but improvement in range of motion.  Mild tightness with Corky Sox.  Negative straight leg test.  Osteopathic findings  T6 extended rotated and side bent left L4 flexed rotated and side bent left Sacrum right on right    Impression and Recommendations:     This case required medical decision making of moderate complexity. The above documentation has been reviewed and is accurate and complete Lyndal Pulley, DO       Note: This dictation was prepared with Dragon dictation along with smaller phrase technology. Any transcriptional errors that result from this process are unintentional.

## 2019-08-08 NOTE — Assessment & Plan Note (Signed)
Significant improvement with conservative therapy.  Recently increased Effexor to 75 mg/day.  Patient has not noticed any improvement but it is only been 1 week.  Discussed with patient about the other medications and the vitamin supplementations.  Patient is doing well with the conservative therapy including the home exercises.  Follow-up again for more osteopathic manipulation in 3 to 4 weeks.

## 2019-08-08 NOTE — Patient Instructions (Signed)
Happy with progress. Keep it up! See me again in 4 weeks

## 2019-09-05 ENCOUNTER — Ambulatory Visit: Payer: 59 | Admitting: Family Medicine

## 2019-09-06 ENCOUNTER — Ambulatory Visit: Payer: 59

## 2019-09-10 MED FILL — ROSUVASTATIN CALCIUM 5 MG T: 5 | 90 days supply | Qty: 90 | Fill #2

## 2019-09-10 MED FILL — LOSARTAN POTASSIUM 25 MG TA: 25 | 90 days supply | Qty: 90 | Fill #2

## 2019-09-23 ENCOUNTER — Ambulatory Visit: Payer: 59

## 2019-10-18 MED FILL — XARELTO 20 MG TABLET: 20 | 90 days supply | Qty: 90 | Fill #1

## 2019-10-31 ENCOUNTER — Encounter: Payer: Self-pay | Admitting: Family Medicine

## 2019-12-09 ENCOUNTER — Encounter: Payer: Self-pay | Admitting: Family Medicine

## 2019-12-10 MED ORDER — ROSUVASTATIN CALCIUM 5 MG PO TABS: 5.0000 mg | ORAL_TABLET | Freq: Every day | ORAL | 3 refills | Status: DC

## 2019-12-10 MED FILL — ROSUVASTATIN CALCIUM 5 MG T: 5 | 90 days supply | Qty: 90 | Fill #0

## 2019-12-10 NOTE — Telephone Encounter (Signed)
Patient was last seen on 05/07/2019 with labs. Next app 05/12/2020 for CPE. Ok to give meds?

## 2019-12-11 MED FILL — LOSARTAN POTASSIUM 25 MG TA: 25 | 90 days supply | Qty: 90 | Fill #3

## 2020-02-18 ENCOUNTER — Ambulatory Visit: Payer: 59 | Admitting: Internal Medicine

## 2020-03-13 ENCOUNTER — Other Ambulatory Visit: Payer: Self-pay | Admitting: Cardiology

## 2020-03-13 MED FILL — LOSARTAN POTASSIUM 25 MG TA: 25 | 90 days supply | Qty: 90 | Fill #0

## 2020-03-13 MED FILL — ROSUVASTATIN CALCIUM 5 MG T: 5 | 90 days supply | Qty: 90 | Fill #1

## 2020-04-09 MED FILL — XARELTO 20 MG TABLET: 20 | 90 days supply | Qty: 90 | Fill #3

## 2020-05-12 ENCOUNTER — Encounter: Payer: Self-pay | Admitting: Family Medicine

## 2020-05-12 ENCOUNTER — Ambulatory Visit (INDEPENDENT_AMBULATORY_CARE_PROVIDER_SITE_OTHER): Payer: 59 | Admitting: Family Medicine

## 2020-05-12 ENCOUNTER — Other Ambulatory Visit: Payer: Self-pay

## 2020-05-12 VITALS — BP 122/64 | HR 72 | Temp 98.2°F | Resp 18 | Ht 73.0 in | Wt 172.0 lb

## 2020-05-12 DIAGNOSIS — Z125 Encounter for screening for malignant neoplasm of prostate: Secondary | ICD-10-CM | POA: Diagnosis not present

## 2020-05-12 DIAGNOSIS — R561 Post traumatic seizures: Secondary | ICD-10-CM

## 2020-05-12 DIAGNOSIS — R351 Nocturia: Secondary | ICD-10-CM | POA: Diagnosis not present

## 2020-05-12 DIAGNOSIS — E785 Hyperlipidemia, unspecified: Secondary | ICD-10-CM

## 2020-05-12 DIAGNOSIS — I480 Paroxysmal atrial fibrillation: Secondary | ICD-10-CM

## 2020-05-12 DIAGNOSIS — I10 Essential (primary) hypertension: Secondary | ICD-10-CM | POA: Diagnosis not present

## 2020-05-12 DIAGNOSIS — Z0000 Encounter for general adult medical examination without abnormal findings: Secondary | ICD-10-CM

## 2020-05-12 DIAGNOSIS — Z23 Encounter for immunization: Secondary | ICD-10-CM | POA: Diagnosis not present

## 2020-05-12 MED ORDER — LOSARTAN POTASSIUM 25 MG PO TABS
25.0000 mg | ORAL_TABLET | Freq: Every day | ORAL | 3 refills | Status: DC
Start: 2020-05-12 — End: 2021-05-13

## 2020-05-12 MED ORDER — RIVAROXABAN 20 MG PO TABS: 20.0000 mg | ORAL_TABLET | Freq: Every day | ORAL | 3 refills | Status: DC

## 2020-05-12 MED ORDER — ROSUVASTATIN CALCIUM 5 MG PO TABS: 5.0000 mg | ORAL_TABLET | Freq: Every day | ORAL | 3 refills | Status: DC

## 2020-05-12 NOTE — Patient Instructions (Addendum)
Please stop by lab before you go If you have mychart- we will send your results within 3 business days of us receiving them.  If you do not have mychart- we will call you about results within 5 business days of us receiving them.  *please note we are currently using [facility] which has a longer processing time than [facility] typically so labs may not come back as quickly as in the past *please also note that you will see labs on mychart as soon as they post. I will later go in and write notes on them- will say "notes from Dr. Hunter"  Health Maintenance Due  Topic Date Due  . INFLUENZA VACCINE In office flu shot today high-dose 03/09/2020     

## 2020-05-12 NOTE — Progress Notes (Signed)
Phone: [phone]   Subjective:  Patient presents today for their annual physical. Chief complaint-noted.   See problem oriented charting- ROS- full  review of systems was completed and negative  except for: PVCs, difficulty urinating, back pain, seasonal allergies, back pain  The following were reviewed and entered/updated in epic: Past Medical History:  Diagnosis Date  . GERD (gastroesophageal reflux disease)   . Hyperlipidemia   . Hypertension   . Paroxysmal atrial fibrillation (HCC)   . Partial tear of rotator cuff 06/27/2015  . 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 Seizure after head injury ([place]) 12/14/2013  . Shoulder pain, left    Patient Active Problem List   Diagnosis Date Noted  . Elevated coronary artery calcium score 04/28/2017    Priority: High  . 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 Seizure after head injury ([place]) 12/14/2013    Priority: High  . Paroxysmal atrial fibrillation ([place]) 09/22/2009    Priority: High  . History of adenomatous polyp of colon 06/26/2019    Priority: Medium  . BPH (benign prostatic hyperplasia) 07/18/2014    Priority: Medium  . Hyperlipidemia 01/02/2008    Priority: Medium  . Essential hypertension 01/02/2008    Priority: Medium  . Other fatigue 07/24/2018    Priority: Low  . Nonallopathic lesion of lumbosacral region 07/31/2019  . Nonallopathic lesion of sacral region 07/31/2019  . Nonallopathic lesion of thoracic region 07/31/2019  . Nonrheumatic aortic valve insufficiency 07/07/2019  . Lumbar spinal stenosis 05/14/2019  . Partial tear of rotator cuff 06/27/2015   Past Surgical History:  Procedure Laterality Date  . ATRIAL FIBRILLATION ABLATION N/A 09/07/2018   Procedure: ATRIAL FIBRILLATION ABLATION;  Surgeon: Thompson Grayer, MD;  Location: [facility];  Service: Cardiovascular;  Laterality: N/A;  . BICEPS TENDON REPAIR    . COLONOSCOPY    . knee scopes Bilateral   . NASAL SEPTUM  SURGERY    . right inguinal hernia     child  . SHOULDER ACROMIOPLASTY Left 06/27/2015   Procedure: SHOULDER ACROMIOPLASTY DEBRIDE LABRUM;  Surgeon: [name], MD;  Location: [facility];  Service: Orthopedics;  Laterality: Left;  . SHOULDER ARTHROSCOPY WITH ROTATOR CUFF REPAIR Left 06/27/2015   Procedure: SHOULDER ARTHROSCOPY WITH ROTATOR CUFF REPAIR;  Surgeon: [name], MD;  Location: [facility];  Service: Orthopedics;  Laterality: Left;  . TONSILECTOMY/ADENOIDECTOMY WITH MYRINGOTOMY     child  . UPPER GASTROINTESTINAL ENDOSCOPY      Family History  Problem Relation Age of Onset  . Cancer - Other Father        renal cell metastatic  . CAD Father 74  . Hypertension Brother        smoker  . CAD Brother 68       stent, smoker  . Colon cancer Neg Hx   . Colon polyps Neg Hx   . Esophageal cancer Neg Hx   . Stomach cancer Neg Hx   . Rectal cancer Neg Hx     Medications- reviewed and updated Current Outpatient Medications  Medication Sig Dispense Refill  . losartan (COZAAR) 25 MG tablet Take 1 tablet (25 mg total) by mouth daily. 90 tablet 3  . rivaroxaban (XARELTO) 20 MG TABS tablet Take 1 tablet (20 mg total) by mouth daily with supper. 90 tablet 3  . rosuvastatin (CRESTOR) 5 MG tablet Take 1 tablet (5 mg total) by mouth daily. 90 tablet 3   No current facility-administered medications for  this visit.    Allergies-reviewed and updated Allergies  Allergen Reactions  . Cardura [Doxazosin Mesylate]     Caused patient to go into a. Fib.  . Simvastatin     muscle aches    Social History   Social History Narrative   Married (2nd marriage in 2010). 3 children from first marriage. 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   Patient drinks about 1 cup of caffeine daily and occasionally has a soda.    Objective  Objective:  BP 122/64   Pulse 72   Temp 98.2 F (36.8 C) (Temporal)   Resp 18   Ht 6\' 1"  (1.854 m)   Wt 172 lb (78 kg)   SpO2 97%   BMI 22.69 kg/m  Gen: NAD, resting comfortably HEENT: Mucous membranes are moist. Oropharynx normal Neck: no thyromegaly CV: RRR no murmurs rubs or gallops Lungs: CTAB no crackles, wheeze, rhonchi Abdomen: soft/nontender/nondistended/normal bowel sounds. No rebound or guarding.  Ext: no edema Skin: warm, dry Neuro: grossly normal, moves all extremities, PERRLA    Assessment and Plan  69 y.o. male presenting for annual physical.  Health Maintenance counseling: 1. Anticipatory guidance: Patient counseled regarding regular dental exams q6 months, eye exams yearly,  avoiding smoking and second hand smoke, limiting alcohol to 2 beverages per day- was 0 for a while now half a glass a wine 3x a week.   2. Risk factor reduction:  Advised patient of need for regular exercise and diet rich and fruits and vegetables to reduce risk of heart attack and stroke. Exercise- gym shut down- increased walking at home and calisthenics. Diet-continues healthy diet.  Wt Readings from Last 3 Encounters:  05/12/20 172 lb (78 kg)  08/08/19 173 lb (78.5 kg)  07/31/19 173 lb (78.5 kg)  3. Immunizations/screenings/ancillary studies- wants to go ahead with flu shot 1 week after booster Immunization History  Administered Date(s) Administered  . Fluad Quad(high Dose 65+) 04/18/2019  . Influenza Split 04/15/2016  . Influenza Whole 04/11/2009  . Influenza, High Dose Seasonal PF 05/04/2018  . Influenza,inj,Quad PF,6+ Mos 04/25/2017  . Influenza-Unspecified 04/23/2014  . PFIZER SARS-COV-2 Vaccination  08/24/2019, 09/14/2019, 05/06/2020  . Pneumococcal Conjugate-13 05/04/2018  . Pneumococcal Polysaccharide-23 05/07/2019  . Td 06/09/2009  . Tdap 12/07/2013  . Zoster 07/18/2014  . Zoster Recombinat (Shingrix) 05/31/2018, 09/27/2018  4. Prostate cancer screening- nocturia once a night. Has to do some double voiding. We will trend PSA with labs.   Lab Results  Component Value Date   PSA 0.61 05/07/2019   PSA 0.57 05/04/2018   PSA 0.70 04/25/2017   5. Colon cancer screening - 06/19/2019 with 7 year repeat 6. Skin cancer screening- does not see dermatologyadvised regular sunscreen use. Denies worrisome, changing, or new skin lesions.  7. Non smoker 8. STD screening - monogamous   Status of chronic or acute concerns   # Atrial fibrillation- s/p ablation. Still gets PVCs though S: Rate controlled with cardizem only as needed in past- now off all meds  Anticoagulated with xarelto Patient is  followed by cardiology: Dr. Percival Spanish  A/P: Stable. Continue current medications.   # Back pain S:doesnt tolerate medicines well- Dr. Tamala Julian tried mobic and effexor but prefers to stay off. Some of the exercises were helpful. Has had imaging A/P: living with pain at present-  Continue to monitor. Defers follow up for now.   #hyperlipidemia/coronary calcium  S: Medication: Rosuvastatin 5Mg  Lab Results  Component Value Date   CHOL 197 05/07/2019   HDL 113.10 05/07/2019   LDLCALC 73 05/07/2019   LDLDIRECT 151.1 08/12/2009   TRIG 51.0 05/07/2019   CHOLHDL 2 05/07/2019   A/P: hopefully controlled- ideally LDL under 70 - update today  #hypertension S: medication: Losartan 25mg  Home readings #s: very similar at home BP Readings from Last 3 Encounters:  05/12/20 122/64  08/08/19 130/82  07/31/19 110/62  A/P: Stable. Continue current medications.   #mild aortic insufficiency- repeat echocardiogram in December or January.   # no recurrent seizures  #postconcussive encephalopathy- still not  practicing medicine. He still has some setbacks related to this.   Recommended follow up: Return in about 1 year (around 05/12/2021) for physical or sooner if needed.  Lab/Order associations:Non fasting   ICD-10-CM   1. Preventative health care  X61.47 COMPLETE METABOLIC PANEL WITH GFR    Lipid panel    CBC with Differential/Platelet    PSA  2. Essential hypertension  W92 COMPLETE METABOLIC PANEL WITH GFR    Lipid panel    CBC with Differential/Platelet  3. Hyperlipidemia, unspecified hyperlipidemia type  H57.4 COMPLETE METABOLIC PANEL WITH GFR    Lipid panel    CBC with Differential/Platelet  4. Screening for prostate cancer  Z12.5 PSA  5. Nocturia  R35.1 PSA    Meds ordered this encounter  Medications  . rosuvastatin (CRESTOR) 5 MG tablet    Sig: Take 1 tablet (5 mg total) by mouth daily.    Dispense:  90 tablet    Refill:  3  . rivaroxaban (XARELTO) 20 MG TABS tablet    Sig: Take 1 tablet (20 mg total) by mouth daily with supper.    Dispense:  90 tablet    Refill:  3  . losartan (COZAAR) 25 MG tablet    Sig: Take 1 tablet (25 mg total) by mouth daily.    Dispense:  90 tablet    Refill:  3    Return precautions advised.  Garret Reddish, MD

## 2020-05-12 NOTE — Addendum Note (Signed)
Addended by: Thomes Cake on: 05/12/2020 11:03 AM   Modules accepted: Orders

## 2020-05-13 ENCOUNTER — Other Ambulatory Visit: Payer: Self-pay | Admitting: Family Medicine

## 2020-05-13 ENCOUNTER — Encounter: Payer: Self-pay | Admitting: Family Medicine

## 2020-05-13 LAB — CBC WITH DIFFERENTIAL/PLATELET
Absolute Monocytes: 456 cells/uL (ref 200–950)
Basophils Absolute: 19 cells/uL (ref 0–200)
Basophils Relative: 0.4 %
Eosinophils Absolute: 89 cells/uL (ref 15–500)
Eosinophils Relative: 1.9 %
HCT: 43.4 % (ref 38.5–50.0)
Hemoglobin: 13.8 g/dL (ref 13.2–17.1)
Lymphs Abs: 705 cells/uL — ABNORMAL LOW (ref 850–3900)
MCH: 30.5 pg (ref 27.0–33.0)
MCHC: 31.8 g/dL — ABNORMAL LOW (ref 32.0–36.0)
MCV: 96 fL (ref 80.0–100.0)
MPV: 11.8 fL (ref 7.5–12.5)
Monocytes Relative: 9.7 %
Neutro Abs: 3431 cells/uL (ref 1500–7800)
Neutrophils Relative %: 73 %
Platelets: 182 10*3/uL (ref 140–400)
RBC: 4.52 10*6/uL (ref 4.20–5.80)
RDW: 12.2 % (ref 11.0–15.0)
Total Lymphocyte: 15 %
WBC: 4.7 10*3/uL (ref 3.8–10.8)

## 2020-05-13 LAB — COMPLETE METABOLIC PANEL WITH GFR
AG Ratio: 1.6 (calc) (ref 1.0–2.5)
ALT: 16 U/L (ref 9–46)
AST: 21 U/L (ref 10–35)
Albumin: 4.2 g/dL (ref 3.6–5.1)
Alkaline phosphatase (APISO): 50 U/L (ref 35–144)
BUN: 24 mg/dL (ref 7–25)
CO2: 31 mmol/L (ref 20–32)
Calcium: 9.6 mg/dL (ref 8.6–10.3)
Chloride: 105 mmol/L (ref 98–110)
Creat: 1.16 mg/dL (ref 0.70–1.25)
GFR, Est African American: 75 mL/min/{1.73_m2} (ref >=60)
GFR, Est Non African American: 65 mL/min/{1.73_m2} (ref >=60)
Globulin: 2.7 g/dL (calc) (ref 1.9–3.7)
Glucose, Bld: 76 mg/dL (ref 65–99)
Potassium: 4.4 mmol/L (ref 3.5–5.3)
Sodium: 141 mmol/L (ref 135–146)
Total Bilirubin: 0.7 mg/dL (ref 0.2–1.2)
Total Protein: 6.9 g/dL (ref 6.1–8.1)

## 2020-05-13 LAB — LIPID PANEL
Cholesterol: 221 mg/dL — ABNORMAL HIGH (ref <200)
HDL: 118 mg/dL (ref >=40)
LDL Cholesterol (Calc): 88 mg/dL (calc)
Non-HDL Cholesterol (Calc): 103 mg/dL (calc) (ref <130)
Total CHOL/HDL Ratio: 1.9 (calc) (ref <5.0)
Triglycerides: 65 mg/dL (ref <150)

## 2020-05-13 LAB — PSA: PSA: 0.58 ng/mL (ref <=4.0)

## 2020-05-13 MED ORDER — ROSUVASTATIN CALCIUM 10 MG PO TABS: 10.0000 mg | ORAL_TABLET | Freq: Every day | ORAL | 3 refills | Status: DC

## 2020-05-13 MED FILL — ROSUVASTATIN CALCIUM 10 MG: 10 | 90 days supply | Qty: 90 | Fill #0

## 2020-06-08 IMAGING — MR MR LUMBAR SPINE W/O CM
4 of 5 series · 18 of 48 positions shown · non-contrast
Comparison: None.

CLINICAL DATA: Back pain. Centralized low back pain does not
radiate.

EXAM:
MRI LUMBAR SPINE WITHOUT CONTRAST
TECHNIQUE: Multiplanar, multisequence MR imaging of the lumbar spine was
performed. No intravenous contrast was administered.

[Series 6: T2 · sagittal · 4.0mm · 0.73mm/px · 6 of 17 slices shown (1 of 2)]
[im 1/17]
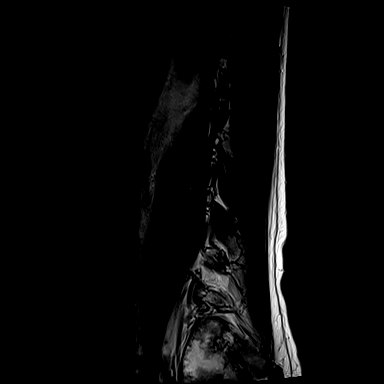
[im 4/17]
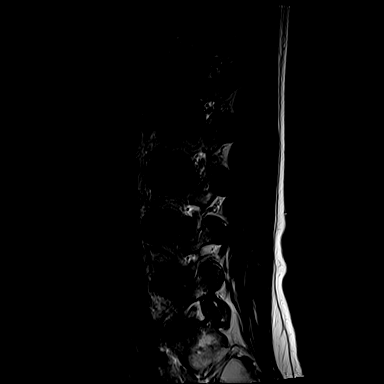
[im 7/17]
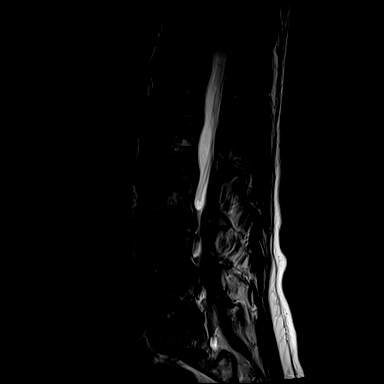
[im 10/17]
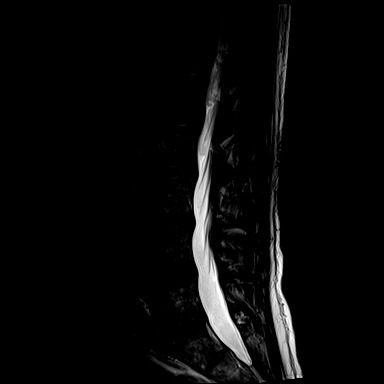
[im 13/17]
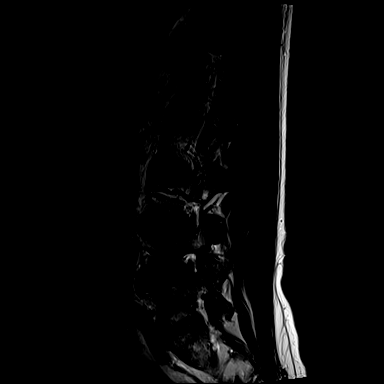
[im 17/17]
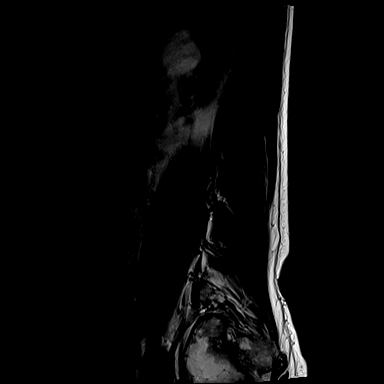

[Series 7: T1 · sagittal · 4.0mm · 0.73mm/px · 3 of 17 slices shown (1 of 2)]
[im 4/17]
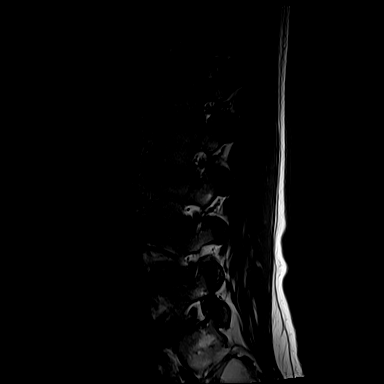
[im 10/17]
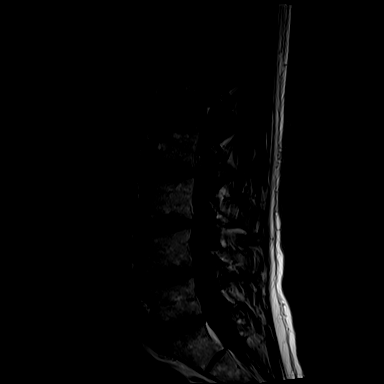
[im 17/17]
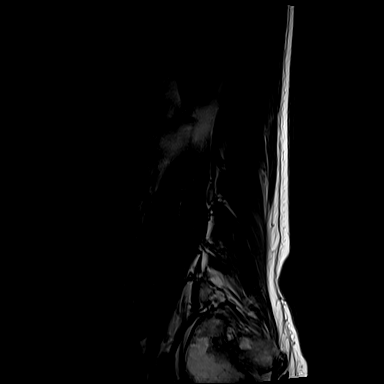

[Series 11: T1 · axial · 4.0mm · 0.28mm/px · z∈[-142,+25]mm · 3 of 44 slices shown (2 of 2)]
[im 7/44]
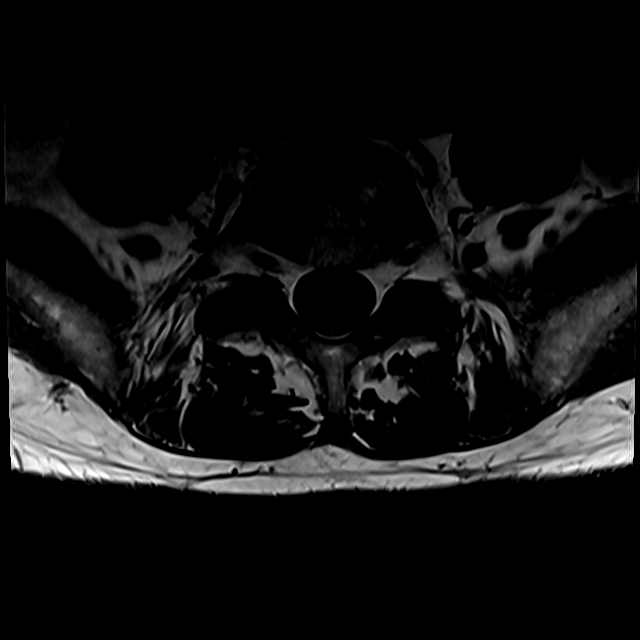
[im 22/44]
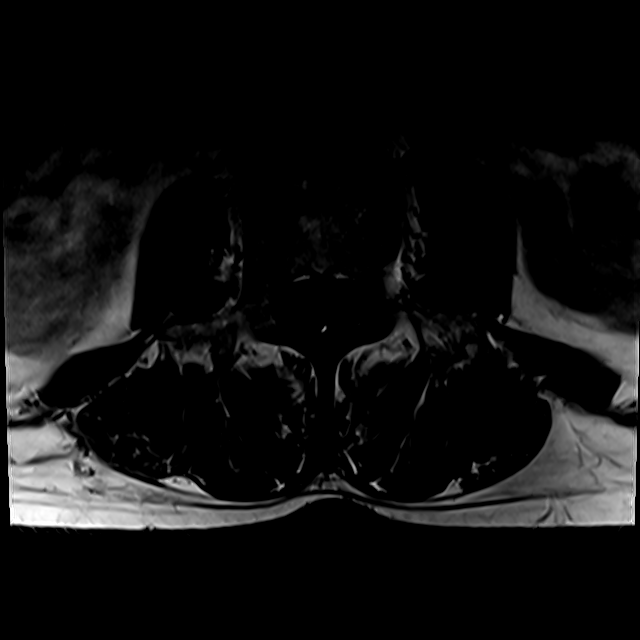
[im 37/44]
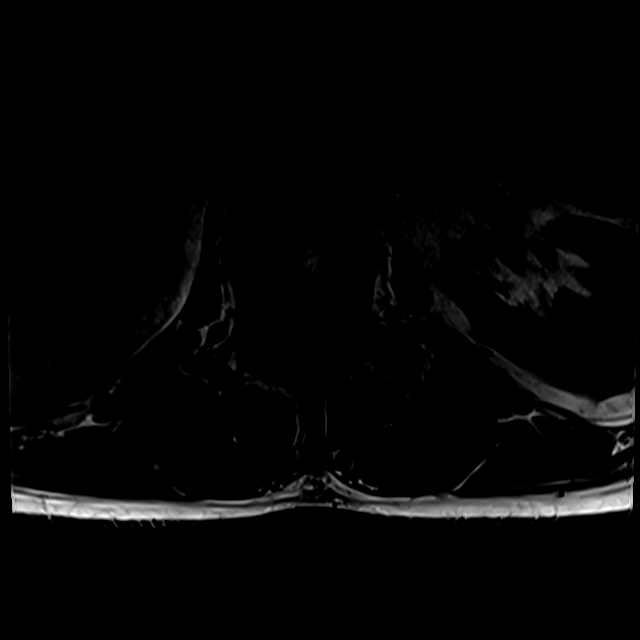

[Series 14: T2 · axial · 4.0mm · 0.28mm/px · z∈[-172,+25]mm · 6 of 44 slices shown (2 of 2)]
[im 1/44]
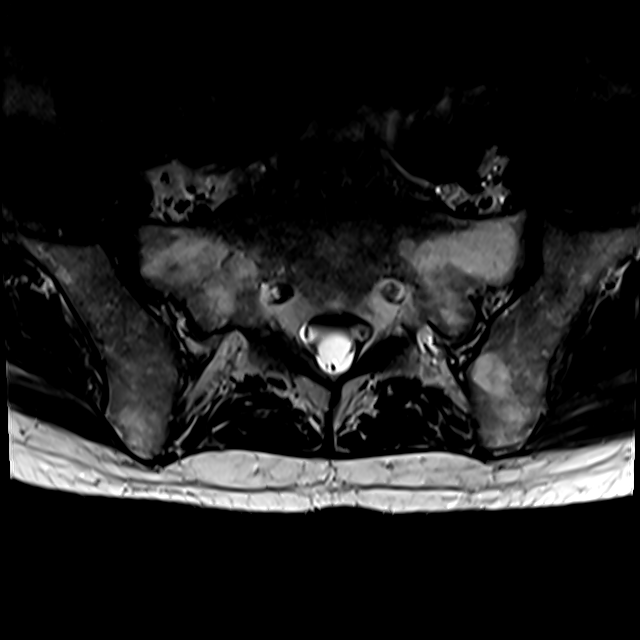
[im 7/44]
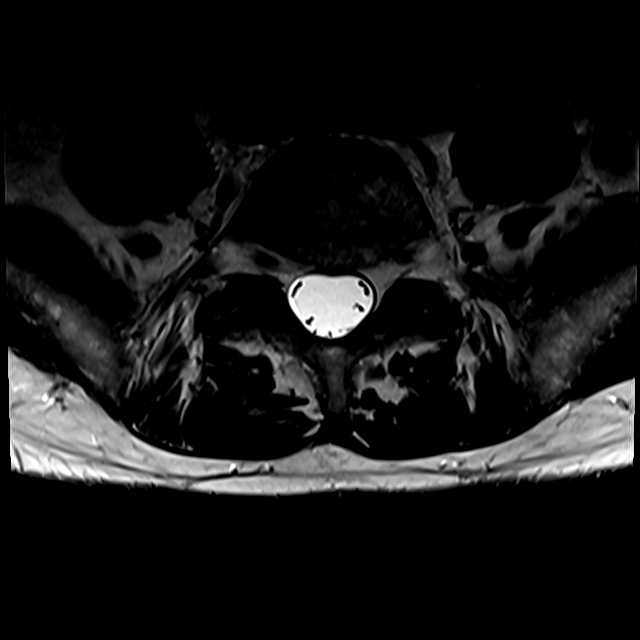
[im 13/44]
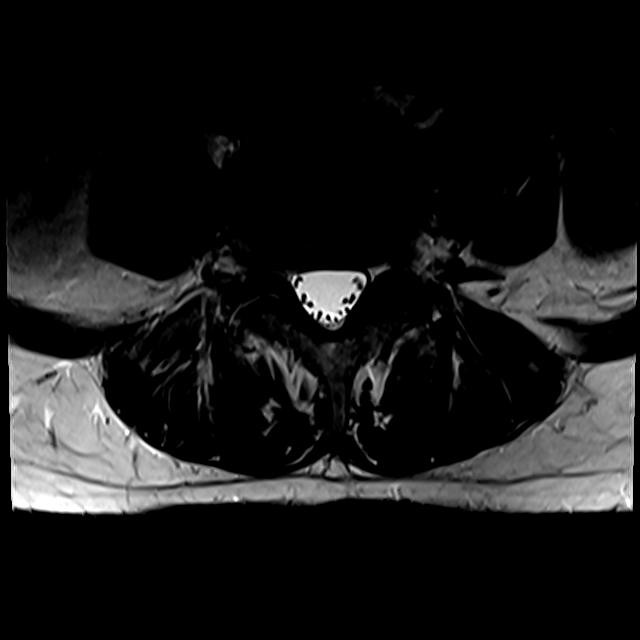
[im 19/44]
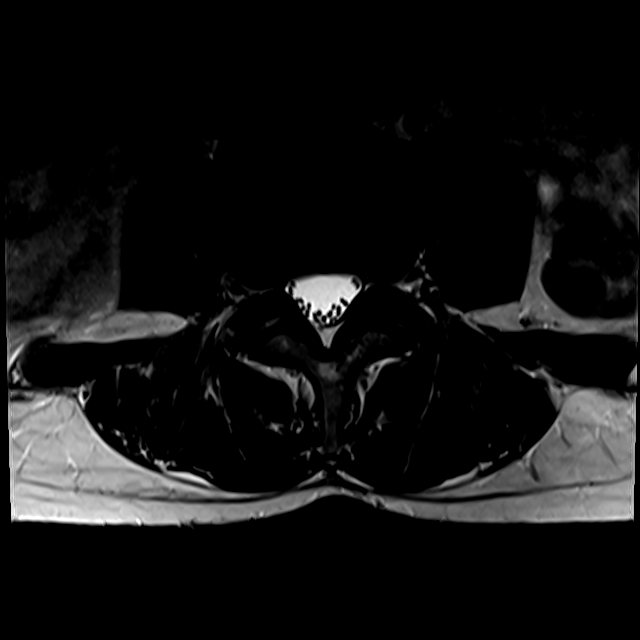
[im 22/44]
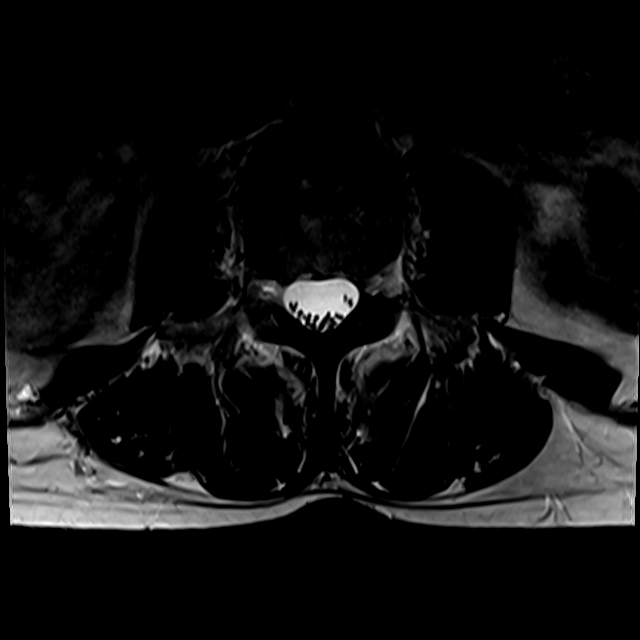
[im 37/44]
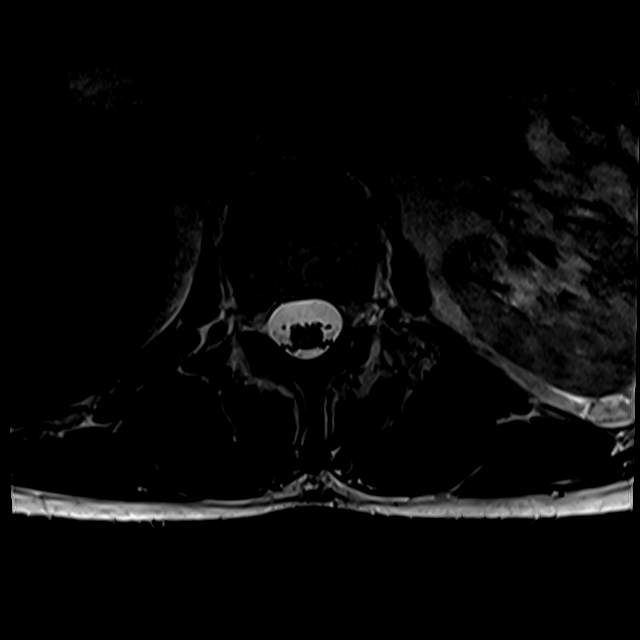

[18 of 48 positions shown; findings below may reference images not displayed]

FINDINGS: Segmentation:  Standard.

Alignment:  Physiologic.

Vertebrae:  No fracture, evidence of discitis, or bone lesion.

Conus medullaris and cauda equina: Conus extends to the L1-2 level.
Conus and cauda equina appear normal.

Paraspinal and other soft tissues: No acute paraspinal abnormality.
Left renal cyst is noted.

Disc levels:

Disc spaces: Degenerative disease with disc height loss at L5-S1 and
to lesser extent L4-5. Disc desiccation at L3-4 with minimal disc
height loss.

T12-L1: No significant disc bulge. No evidence of neural foraminal
stenosis. No central canal stenosis. Mild bilateral facet
arthropathy.

L1-L2: No significant disc bulge. Mild bilateral facet arthropathy.
No evidence of neural foraminal stenosis. No central canal stenosis.

L2-L3: No significant disc bulge. Mild bilateral facet arthropathy.
No evidence of neural foraminal stenosis. No central canal stenosis.

L3-L4: Mild broad-based disc bulge. Mild bilateral facet
arthropathy. No evidence of neural foraminal stenosis. No central
canal stenosis.

L4-L5: Broad-based disc bulge. Mild bilateral facet arthropathy.
Right subarticular recess narrowing. No evidence of neural foraminal
stenosis. No central canal stenosis.

L5-S1: Mild broad-based disc bulge. No evidence of neural foraminal
stenosis. No central canal stenosis.
IMPRESSION: 1. Mild lumbar spine spondylosis as detailed above.
2.  No acute osseous injury of the lumbar spine.

## 2020-06-11 MED FILL — LOSARTAN POTASSIUM 25 MG TA: 25 | 90 days supply | Qty: 90 | Fill #1

## 2020-07-09 MED FILL — XARELTO 20 MG TABLET: 20 | 90 days supply | Qty: 90 | Fill #0

## 2020-08-10 DIAGNOSIS — I2510 Atherosclerotic heart disease of native coronary artery without angina pectoris: Secondary | ICD-10-CM | POA: Insufficient documentation

## 2020-08-10 DIAGNOSIS — I341 Nonrheumatic mitral (valve) prolapse: Secondary | ICD-10-CM | POA: Insufficient documentation
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
## 2020-08-12 ENCOUNTER — Ambulatory Visit (INDEPENDENT_AMBULATORY_CARE_PROVIDER_SITE_OTHER): Payer: 59 | Admitting: Cardiology

## 2020-08-12 ENCOUNTER — Other Ambulatory Visit: Payer: Self-pay

## 2020-08-12 ENCOUNTER — Encounter: Payer: Self-pay | Admitting: Cardiology

## 2020-08-12 VITALS — BP 136/80 | HR 51 | Ht 73.0 in | Wt 176.0 lb

## 2020-08-12 DIAGNOSIS — I10 Essential (primary) hypertension: Secondary | ICD-10-CM

## 2020-08-12 DIAGNOSIS — I341 Nonrheumatic mitral (valve) prolapse: Secondary | ICD-10-CM

## 2020-08-12 DIAGNOSIS — I480 Paroxysmal atrial fibrillation: Secondary | ICD-10-CM | POA: Diagnosis not present

## 2020-08-12 DIAGNOSIS — I2510 Atherosclerotic heart disease of native coronary artery without angina pectoris: Secondary | ICD-10-CM

## 2020-08-12 DIAGNOSIS — I351 Nonrheumatic aortic (valve) insufficiency: Secondary | ICD-10-CM

## 2020-08-12 NOTE — Patient Instructions (Signed)
Medication Instructions:  Stop Xarelto Start Aspirin 81 mg daily Continue all other medications  *If you need a refill on your cardiac medications before your next appointment, please call your pharmacy*   Lab Work: Fasting Lipid panel   Testing/Procedures: None ordered   Follow-Up: At [facility], you and your health needs are our priority.  As part of our continuing mission to provide you with exceptional heart care, we have created designated Provider Care Teams.  These Care Teams include your primary Cardiologist (physician) and Advanced Practice Providers (APPs -  Physician Assistants and Nurse Practitioners) who all work together to provide you with the care you need, when you need it.  We recommend signing up for the patient portal called "MyChart".  Sign up information is provided on this After Visit Summary.  MyChart is used to connect with patients for Virtual Visits (Telemedicine).  Patients are able to view lab/test results, encounter notes, upcoming appointments, etc.  Non-urgent messages can be sent to your provider as well.   To learn more about what you can do with MyChart, go to [email or web address].    Your next appointment:  1 year   The format for your next appointment: Office   Provider:  Dr.Hochrein

## 2020-08-13 DIAGNOSIS — I10 Essential (primary) hypertension: Secondary | ICD-10-CM | POA: Diagnosis not present

## 2020-08-13 DIAGNOSIS — I480 Paroxysmal atrial fibrillation: Secondary | ICD-10-CM | POA: Diagnosis not present

## 2020-08-13 DIAGNOSIS — I341 Nonrheumatic mitral (valve) prolapse: Secondary | ICD-10-CM | POA: Diagnosis not present

## 2020-08-13 DIAGNOSIS — I2510 Atherosclerotic heart disease of native coronary artery without angina pectoris: Secondary | ICD-10-CM | POA: Diagnosis not present

## 2020-08-13 DIAGNOSIS — I351 Nonrheumatic aortic (valve) insufficiency: Secondary | ICD-10-CM | POA: Diagnosis not present

## 2020-08-13 LAB — LIPID PANEL
Chol/HDL Ratio: 1.9 ratio (ref 0.0–5.0)
Cholesterol, Total: 238 mg/dL — ABNORMAL HIGH (ref 100–199)
HDL: 126 mg/dL (ref >39)
LDL Chol Calc (NIH): 101 mg/dL — ABNORMAL HIGH (ref 0–99)
Triglycerides: 65 mg/dL (ref 0–149)
VLDL Cholesterol Cal: 11 mg/dL (ref 5–40)

## 2020-08-13 NOTE — Addendum Note (Signed)
Addended by: Neoma Laming on: 08/13/2020 02:03 PM   Modules accepted: Orders

## 2020-08-18 ENCOUNTER — Other Ambulatory Visit: Payer: Self-pay | Admitting: Cardiology

## 2020-08-18 DIAGNOSIS — E785 Hyperlipidemia, unspecified: Secondary | ICD-10-CM

## 2020-08-18 MED ORDER — ROSUVASTATIN CALCIUM 20 MG PO TABS: 20.0000 mg | ORAL_TABLET | Freq: Every day | ORAL | 3 refills | Status: DC

## 2020-08-18 MED FILL — ROSUVASTATIN CALCIUM 20 MG: 20 | 90 days supply | Qty: 90 | Fill #0

## 2020-09-08 MED FILL — LOSARTAN POTASSIUM 25 MG TA: 25 | 30 days supply | Qty: 30 | Fill #2

## 2020-10-13 MED FILL — LOSARTAN POTASSIUM 25 MG TA: 25 | 30 days supply | Qty: 30 | Fill #3

## 2020-10-16 ENCOUNTER — Other Ambulatory Visit (HOSPITAL_COMMUNITY): Payer: Self-pay | Admitting: Optometrist

## 2020-10-16 DIAGNOSIS — H40013 Open angle with borderline findings, low risk, bilateral: Secondary | ICD-10-CM | POA: Diagnosis not present

## 2020-10-16 MED FILL — NEO/POLY/DEXAMET EYE OINT: 3.5-10000-0 | 15 days supply | Qty: 4 | Fill #0

## 2020-10-29 DIAGNOSIS — E785 Hyperlipidemia, unspecified: Secondary | ICD-10-CM | POA: Diagnosis not present

## 2020-10-31 LAB — LIPID PANEL
Chol/HDL Ratio: 1.8 ratio (ref 0.0–5.0)
Cholesterol, Total: 196 mg/dL (ref 100–199)
HDL: 111 mg/dL (ref >39)
LDL Chol Calc (NIH): 75 mg/dL (ref 0–99)
Triglycerides: 55 mg/dL (ref 0–149)
VLDL Cholesterol Cal: 10 mg/dL (ref 5–40)

## 2020-11-08 ENCOUNTER — Other Ambulatory Visit (HOSPITAL_COMMUNITY): Payer: Self-pay

## 2020-11-16 MED FILL — Rosuvastatin Calcium Tab 20 MG: ORAL | 90 days supply | Qty: 90 | Fill #0 | Status: AC

## 2020-11-16 MED FILL — Losartan Potassium Tab 25 MG: ORAL | 90 days supply | Qty: 90 | Fill #0 | Status: AC

## 2020-11-17 ENCOUNTER — Other Ambulatory Visit (HOSPITAL_COMMUNITY): Payer: Self-pay

## 2020-11-18 ENCOUNTER — Encounter: Payer: Self-pay | Admitting: Family Medicine

## 2021-02-15 MED FILL — Losartan Potassium Tab 25 MG: ORAL | 30 days supply | Qty: 30 | Fill #1 | Status: AC

## 2021-02-15 MED FILL — Rosuvastatin Calcium Tab 20 MG: ORAL | 90 days supply | Qty: 90 | Fill #1 | Status: AC

## 2021-02-16 ENCOUNTER — Other Ambulatory Visit (HOSPITAL_COMMUNITY): Payer: Self-pay

## 2021-03-16 ENCOUNTER — Other Ambulatory Visit (HOSPITAL_COMMUNITY): Payer: Self-pay

## 2021-03-16 ENCOUNTER — Other Ambulatory Visit: Payer: Self-pay | Admitting: Cardiology

## 2021-03-16 MED ORDER — LOSARTAN POTASSIUM 25 MG PO TABS
25.0000 mg | ORAL_TABLET | Freq: Every day | ORAL | 3 refills | Status: DC
  Filled 2021-03-16: qty 90, 90d supply, fill #0
  Filled 2021-06-28: qty 90, 90d supply, fill #1
  Filled 2021-09-06: qty 90, 90d supply, fill #2

## 2021-05-08 NOTE — Progress Notes (Signed)
Phone: [phone]   Subjective:  Patient presents today for their annual physical. Chief complaint-noted.   See problem oriented charting- ROS- full  review of systems was completed and negative  per full ROS sheet  The following were reviewed and entered/updated in epic: Past Medical History:  Diagnosis Date   GERD (gastroesophageal reflux disease)    Hyperlipidemia    Hypertension    Paroxysmal atrial fibrillation (HCC)    Partial tear of rotator cuff 06/27/2015   Post concussive encephalopathy 12/26/2013   Seizure after head injury ([place]) 12/14/2013   Shoulder pain, left    Patient Active Problem List   Diagnosis Date Noted   Elevated coronary artery calcium score 04/28/2017    Priority: 1.   Post concussive encephalopathy 12/26/2013    Priority: 1.   Seizure after head injury ([place]) 12/14/2013    Priority: 1.   Paroxysmal atrial fibrillation ([place]) 09/22/2009    Priority: 1.   History of adenomatous polyp of colon 06/26/2019    Priority: 2.   BPH (benign prostatic hyperplasia) 07/18/2014    Priority: 2.   Hyperlipidemia 01/02/2008    Priority: 2.   Essential hypertension 01/02/2008    Priority: 2.   Other fatigue 07/24/2018    Priority: 3.   MVP (mitral valve prolapse) 08/10/2020   Coronary artery disease involving native coronary artery of native heart without angina pectoris 08/10/2020   Nonallopathic lesion of lumbosacral region 07/31/2019   Nonallopathic lesion of sacral region 07/31/2019   Nonallopathic lesion of thoracic region 07/31/2019   Nonrheumatic aortic valve insufficiency 07/07/2019   Lumbar spinal stenosis 05/14/2019   Partial tear of rotator cuff 06/27/2015   Past Surgical History:  Procedure Laterality Date   ATRIAL FIBRILLATION ABLATION N/A 09/07/2018   Procedure: ATRIAL FIBRILLATION ABLATION;  Surgeon: Thompson Grayer, MD;  Location: [facility];  Service: Cardiovascular;  Laterality: N/A;   BICEPS TENDON REPAIR     COLONOSCOPY      knee scopes Bilateral    NASAL SEPTUM SURGERY     right inguinal hernia     child   SHOULDER ACROMIOPLASTY Left 06/27/2015   Procedure: SHOULDER ACROMIOPLASTY DEBRIDE LABRUM;  Surgeon: [name], MD;  Location: [facility];  Service: Orthopedics;  Laterality: Left;   SHOULDER ARTHROSCOPY WITH ROTATOR CUFF REPAIR Left 06/27/2015   Procedure: SHOULDER ARTHROSCOPY WITH ROTATOR CUFF REPAIR;  Surgeon: [name], MD;  Location: [place];  Service: Orthopedics;  Laterality: Left;   TONSILECTOMY/ADENOIDECTOMY WITH MYRINGOTOMY     child   UPPER GASTROINTESTINAL ENDOSCOPY      Family History  Problem Relation Age of Onset   Cancer - Other Father        renal cell metastatic   CAD Father 87   Hypertension Brother        smoker   CAD Brother 13       stent, smoker   Colon cancer Neg Hx    Colon polyps Neg Hx    Esophageal cancer Neg Hx    Stomach cancer Neg Hx    Rectal cancer Neg Hx     Medications- reviewed and updated Current Outpatient Medications  Medication Sig Dispense Refill   aspirin EC 81 MG tablet Take 1 tablet (81 mg total) by mouth daily. Swallow whole. 30 tablet 11   losartan (COZAAR) 25 MG tablet Take 1 tablet (25 mg total) by mouth daily. 90 tablet 3   rosuvastatin (CRESTOR) 20 MG tablet TAKE 1 TABLET (  20 MG TOTAL) BY MOUTH DAILY. 90 tablet 3   No current facility-administered medications for this visit.    Allergies-reviewed and updated Allergies  Allergen Reactions   Cardura [Doxazosin Mesylate]     Caused patient to go into a. Fib.   Simvastatin     muscle aches    Social History   Social History Narrative   Married (2nd marriage in 2010). 3 children from first marriage. 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   Patient drinks about 1 cup of caffeine daily and occasionally has a soda.    Objective  Objective:  BP 126/73   Pulse (!) 57   Temp 97.9 F (36.6 C) (Temporal)   Ht 6\' 1"  (1.854 m)   Wt 170 lb (77.1 kg)   SpO2 98%   BMI 22.43 kg/m  Gen: NAD, resting comfortably HEENT: Mucous membranes are moist. Oropharynx normal Neck: no thyromegaly CV: RRR no murmurs rubs or gallops Lungs: CTAB no crackles, wheeze, rhonchi Abdomen: soft/nontender/nondistended/normal bowel sounds. No rebound or guarding.  Ext: no edema Skin: warm, dry Neuro: grossly normal, moves all extremities, PERRLA   Assessment and Plan  70 y.o. male presenting for annual physical.  Health Maintenance counseling: 1. Anticipatory guidance: Patient counseled regarding regular dental exams -q6 months, eye exams yearly,  avoiding smoking and second hand smoke , limiting alcohol to 2 beverages per day - was 0 for a while now half a glass a wine total per week and occasional beer.  No illicit drugs 2. Risk factor reduction:  Advised patient of need for regular exercise and diet rich and fruits and vegetables to reduce risk of heart attack and stroke.  Exercise- gym shut down last year- increased walking at home and calisthenics-today he reports walking daily at least 2-3 miles plus works out at [employer] 3 days a week. Plays golf once a week. Pickleball until had plantar fasciitis unfortunately- did not tolerate injection before 20 years ago- slowly improving. Working through home PT exercises and has orthostic.  Diet-continues healthy diet.  Wt Readings from Last 3 Encounters:  05/13/21 170 lb (77.1 kg)  08/12/20 176 lb (79.8 kg)  05/12/20 172 lb (78 kg)  3. Immunizations/screenings/ancillary studies DISCUSSED:  -COVID  bivalent vaccination discussed at pharmacy  - flu shot a week or two ago Immunization History  Administered Date(s) Administered   Fluad Quad(high Dose 65+) 04/18/2019, 05/12/2020   Influenza Split 04/15/2016   Influenza Whole 04/11/2009   Influenza, High Dose Seasonal PF 05/04/2018   Influenza,inj,Quad PF,6+ Mos 04/25/2017   Influenza-Unspecified 04/23/2014   PFIZER(Purple Top)SARS-COV-2 Vaccination 08/24/2019, 09/14/2019, 05/06/2020, 11/06/2020   Pneumococcal Conjugate-13 05/04/2018   Pneumococcal Polysaccharide-23 05/07/2019   Td 06/09/2009   Tdap 12/07/2013   Zoster Recombinat (Shingrix) 05/31/2018, 09/27/2018   Zoster, Live 07/18/2014  4. Prostate cancer screening- nocturia once a night. Has to do some double voiding. We will trend PSA with labs.  no worsening urinary symptoms- perhaps slightly bette  Lab Results  Component Value Date  PSA 0.58 05/12/2020   PSA 0.61 05/07/2019   PSA 0.57 05/04/2018   5. Colon cancer screening - colonoscopy 06/19/19, with 7 year repeat 6. Skin cancer screening-  does not see dermatology- advised regular sunscreen use. Denies worrisome, changing, or new skin lesions.  7. Smoking associated screening (lung cancer screening, AAA screen 65-75, UA)- never smoker 8. STD screening - monogamous  so not needed  Status of chronic or acute concerns   # Atrial fibrillation- s/p ablation. Still gets PVCs though. Did well after ablation S: Rate controlled with cardizem only as needed in past- now off all meds Anticoagulated with no meds- on asa only Prior xarelto (discontinued since have been 2 years past ablation with no recurrence)  Patient followed by cardiology: Dr. Percival Spanish  A/P: doing well on ablation- continue current asa alone    #hyperlipidemia/coronary calcium  S: Medication: Rosuvastatin 20 Mg daily-increased in January- mild GI rumbling increase and some increased gas.  Baby aspirin also recommended by cardiology Lab Results  Component  Value Date   CHOL 196 10/29/2020   HDL 111 10/29/2020   LDLCALC 75 10/29/2020   LDLDIRECT 151.1 08/12/2009   TRIG 55 10/29/2020   CHOLHDL 1.8 10/29/2020   A/P: Hopefully stable or slightly improved-with coronary artery calcium would prefer LDL under 70 if possible-update lipid panel today - on statin check fasting cbg   #hypertension S: medication: Losartan 25mg  daily Home readings #s: [id number] systolic at home. No orthostatic symptoms.  BP Readings from Last 3 Encounters:  05/13/21 126/73  08/12/20 136/80  05/12/20 122/64  A/P: Controlled. Continue current medications.     # Back pain S:doesnt tolerate medicines well- Dr. Tamala Julian tried mobic and effexor but preferred to stay off. Some of the exercises were helpful. Had imaging A/P: no recent issues- continue to monitor  #no recurrent seizures- after 1 episode of electrocution    #postconcussive encephalopathy- still not practicing medicine. Doing reasonably well - intermittent issues  Recommended follow up: Return in about 1 year (around 05/13/2022) for physical or sooner if needed..  Lab/Order associations: fasting   ICD-10-CM   1. Preventative health care  Z00.00 CBC with Differential/Platelet    Comprehensive metabolic panel    Lipid panel    PSA    Hemoglobin A1c    2. Paroxysmal atrial fibrillation (HCC)  I48.0     3. Hyperlipidemia, unspecified hyperlipidemia type  E78.5 CBC with Differential/Platelet    Comprehensive metabolic panel    Lipid panel    4. Essential hypertension  I10     5. Screening for prostate cancer  Z12.5 PSA    6. High risk medication use  Z79.899 Hemoglobin A1c      No orders of the defined types were placed in this encounter.   I,Jada Bradford,acting as a scribe for Garret Reddish, MD.,have documented all relevant documentation on the behalf of Garret Reddish, MD,as directed by  Garret Reddish, MD while in the presence of Garret Reddish, MD.  I, Garret Reddish, MD, have reviewed all  documentation for this visit. The documentation on 05/13/21 for the exam, diagnosis, procedures, and orders are all accurate and complete.  Return precautions advised.  Garret Reddish, MD

## 2021-05-13 ENCOUNTER — Other Ambulatory Visit: Payer: Self-pay

## 2021-05-13 ENCOUNTER — Ambulatory Visit (INDEPENDENT_AMBULATORY_CARE_PROVIDER_SITE_OTHER): Payer: 59 | Admitting: Family Medicine

## 2021-05-13 ENCOUNTER — Encounter: Payer: Self-pay | Admitting: Family Medicine

## 2021-05-13 VITALS — BP 126/73 | HR 57 | Temp 97.9°F | Ht 73.0 in | Wt 170.0 lb

## 2021-05-13 DIAGNOSIS — R561 Post traumatic seizures: Secondary | ICD-10-CM

## 2021-05-13 DIAGNOSIS — E785 Hyperlipidemia, unspecified: Secondary | ICD-10-CM

## 2021-05-13 DIAGNOSIS — Z0000 Encounter for general adult medical examination without abnormal findings: Secondary | ICD-10-CM | POA: Diagnosis not present

## 2021-05-13 DIAGNOSIS — Z125 Encounter for screening for malignant neoplasm of prostate: Secondary | ICD-10-CM | POA: Diagnosis not present

## 2021-05-13 DIAGNOSIS — I480 Paroxysmal atrial fibrillation: Secondary | ICD-10-CM

## 2021-05-13 DIAGNOSIS — I10 Essential (primary) hypertension: Secondary | ICD-10-CM | POA: Diagnosis not present

## 2021-05-13 DIAGNOSIS — Z79899 Other long term (current) drug therapy: Secondary | ICD-10-CM

## 2021-05-13 LAB — COMPREHENSIVE METABOLIC PANEL
ALT: 29 U/L (ref 0–53)
AST: 31 U/L (ref 0–37)
Albumin: 4.4 g/dL (ref 3.5–5.2)
Alkaline Phosphatase: 52 U/L (ref 39–117)
BUN: 23 mg/dL (ref 6–23)
CO2: 31 mEq/L (ref 19–32)
Calcium: 9.7 mg/dL (ref 8.4–10.5)
Chloride: 103 mEq/L (ref 96–112)
Creatinine, Ser: 1.25 mg/dL (ref 0.40–1.50)
GFR: 58.98 mL/min — ABNORMAL LOW (ref >60.00)
Glucose, Bld: 82 mg/dL (ref 70–99)
Potassium: 4.8 mEq/L (ref 3.5–5.1)
Sodium: 140 mEq/L (ref 135–145)
Total Bilirubin: 0.9 mg/dL (ref 0.2–1.2)
Total Protein: 7 g/dL (ref 6.0–8.3)

## 2021-05-13 LAB — CBC WITH DIFFERENTIAL/PLATELET
Basophils Absolute: 0 10*3/uL (ref 0.0–0.1)
Basophils Relative: 0.7 % (ref 0.0–3.0)
Eosinophils Absolute: 0.1 10*3/uL (ref 0.0–0.7)
Eosinophils Relative: 1.9 % (ref 0.0–5.0)
HCT: 44 % (ref 39.0–52.0)
Hemoglobin: 14.6 g/dL (ref 13.0–17.0)
Lymphocytes Relative: 17.5 % (ref 12.0–46.0)
Lymphs Abs: 0.8 10*3/uL (ref 0.7–4.0)
MCHC: 33.2 g/dL (ref 30.0–36.0)
MCV: 94.8 fl (ref 78.0–100.0)
Monocytes Absolute: 0.7 10*3/uL (ref 0.1–1.0)
Monocytes Relative: 14.2 % — ABNORMAL HIGH (ref 3.0–12.0)
Neutro Abs: 3 10*3/uL (ref 1.4–7.7)
Neutrophils Relative %: 65.7 % (ref 43.0–77.0)
Platelets: 164 10*3/uL (ref 150.0–400.0)
RBC: 4.65 Mil/uL (ref 4.22–5.81)
RDW: 14 % (ref 11.5–15.5)
WBC: 4.6 10*3/uL (ref 4.0–10.5)

## 2021-05-13 LAB — POC URINALSYSI DIPSTICK (AUTOMATED)
Bilirubin, UA: NEGATIVE
Blood, UA: NEGATIVE
Glucose, UA: NEGATIVE
Ketones, UA: NEGATIVE
Leukocytes, UA: NEGATIVE
Nitrite, UA: NEGATIVE
Protein, UA: NEGATIVE
Spec Grav, UA: 1.015 (ref 1.010–1.025)
Urobilinogen, UA: 0.2 E.U./dL
pH, UA: 6 (ref 5.0–8.0)

## 2021-05-13 LAB — LIPID PANEL
Cholesterol: 200 mg/dL (ref 0–200)
HDL: 111.2 mg/dL (ref >39.00)
LDL Cholesterol: 76 mg/dL (ref 0–99)
NonHDL: 88.8
Total CHOL/HDL Ratio: 2
Triglycerides: 65 mg/dL (ref 0.0–149.0)
VLDL: 13 mg/dL (ref 0.0–40.0)

## 2021-05-13 LAB — PSA: PSA: 0.69 ng/mL (ref 0.10–4.00)

## 2021-05-13 LAB — HEMOGLOBIN A1C: Hgb A1c MFr Bld: 6.1 % (ref 4.6–6.5)

## 2021-05-13 NOTE — Addendum Note (Signed)
Addended by: Loura Back on: 05/13/2021 11:18 AM   Modules accepted: Orders

## 2021-05-13 NOTE — Patient Instructions (Addendum)
Health Maintenance Due  Topic Date Due   COVID-19 Vaccine (5 - Booster for [employer] series)  - Please consider new Omicron booster shot this season.  -Give it about 2 more weeks to receive since you just receive flu shot.    03/08/2021   Please stop by lab before you go If you have mychart- we will send your results within 3 business days of [place] receiving them.  If you do not have mychart- we will call you about results within 5 business days of [place] receiving them.  *please also note that you will see labs on mychart as soon as they post. I will later go in and write notes on them- will say "notes from Dr. Yong Channel"  Recommended follow up: Return in about 1 year (around 05/13/2022) for physical or sooner if needed.Marland Kitchen

## 2021-05-15 ENCOUNTER — Encounter: Payer: Self-pay | Admitting: Family Medicine

## 2021-06-11 MED FILL — Rosuvastatin Calcium Tab 20 MG: ORAL | 90 days supply | Qty: 90 | Fill #2 | Status: AC

## 2021-06-12 ENCOUNTER — Other Ambulatory Visit (HOSPITAL_COMMUNITY): Payer: Self-pay

## 2021-06-29 ENCOUNTER — Other Ambulatory Visit (HOSPITAL_COMMUNITY): Payer: Self-pay

## 2021-07-14 ENCOUNTER — Encounter: Payer: Self-pay | Admitting: Cardiology

## 2021-08-13 ENCOUNTER — Ambulatory Visit: Payer: 59 | Admitting: Cardiology

## 2021-08-17 ENCOUNTER — Ambulatory Visit: Payer: 59 | Admitting: Cardiology

## 2021-08-17 ENCOUNTER — Other Ambulatory Visit (HOSPITAL_COMMUNITY): Payer: Self-pay

## 2021-08-17 DIAGNOSIS — M7130 Other bursal cyst, unspecified site: Secondary | ICD-10-CM | POA: Diagnosis not present

## 2021-08-17 DIAGNOSIS — L814 Other melanin hyperpigmentation: Secondary | ICD-10-CM | POA: Diagnosis not present

## 2021-08-17 DIAGNOSIS — D1801 Hemangioma of skin and subcutaneous tissue: Secondary | ICD-10-CM | POA: Diagnosis not present

## 2021-08-17 DIAGNOSIS — L570 Actinic keratosis: Secondary | ICD-10-CM | POA: Diagnosis not present

## 2021-08-17 DIAGNOSIS — L720 Epidermal cyst: Secondary | ICD-10-CM | POA: Diagnosis not present

## 2021-08-17 DIAGNOSIS — L821 Other seborrheic keratosis: Secondary | ICD-10-CM | POA: Diagnosis not present

## 2021-08-17 MED ORDER — FLUOROURACIL 5 % EX CREA
TOPICAL_CREAM | CUTANEOUS | 0 refills | Status: DC
  Filled 2021-08-17: qty 40, 14d supply, fill #0
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
## 2021-08-24 ENCOUNTER — Ambulatory Visit (INDEPENDENT_AMBULATORY_CARE_PROVIDER_SITE_OTHER): Payer: 59 | Admitting: Cardiology

## 2021-08-24 ENCOUNTER — Other Ambulatory Visit: Payer: Self-pay

## 2021-08-24 ENCOUNTER — Encounter: Payer: Self-pay | Admitting: Cardiology

## 2021-08-24 VITALS — BP 132/62 | HR 49 | Ht 73.0 in | Wt 175.6 lb

## 2021-08-24 DIAGNOSIS — E785 Hyperlipidemia, unspecified: Secondary | ICD-10-CM

## 2021-08-24 DIAGNOSIS — I341 Nonrheumatic mitral (valve) prolapse: Secondary | ICD-10-CM

## 2021-08-24 DIAGNOSIS — I10 Essential (primary) hypertension: Secondary | ICD-10-CM | POA: Diagnosis not present

## 2021-08-24 DIAGNOSIS — I480 Paroxysmal atrial fibrillation: Secondary | ICD-10-CM | POA: Diagnosis not present

## 2021-08-24 DIAGNOSIS — I2510 Atherosclerotic heart disease of native coronary artery without angina pectoris: Secondary | ICD-10-CM | POA: Diagnosis not present

## 2021-08-24 NOTE — Patient Instructions (Signed)
Medication Instructions:  Your Physician recommend you continue on your current medication as directed.    *If you need a refill on your cardiac medications before your next appointment, please call your pharmacy*   Lab Work: Please return for fasting blood work (lipoprotein a) If you have labs (blood work) drawn today and your tests are completely normal, you will receive your results only by: [place] (if you have MyChart) OR A paper copy in the mail If you have any lab test that is abnormal or we need to change your treatment, we will call you to review the results.   Testing/Procedures: Your physician has requested that you have an echocardiogram. Echocardiography is a painless test that uses sound waves to create images of your heart. It provides your doctor with information about the size and shape of your heart and how well your heart's chambers and valves are working. This procedure takes approximately one hour. There are no restrictions for this procedure.    Follow-Up: At [facility], you and your health needs are our priority.  As part of our continuing mission to provide you with exceptional heart care, we have created designated Provider Care Teams.  These Care Teams include your primary Cardiologist (physician) and Advanced Practice Providers (APPs -  Physician Assistants and Nurse Practitioners) who all work together to provide you with the care you need, when you need it.  We recommend signing up for the patient portal called "MyChart".  Sign up information is provided on this After Visit Summary.  MyChart is used to connect with patients for Virtual Visits (Telemedicine).  Patients are able to view lab/test results, encounter notes, upcoming appointments, etc.  Non-urgent messages can be sent to your provider as well.   To learn more about what you can do with MyChart, go to [email or web address].    Your next appointment:   1 year(s)  The format for your next  appointment:   In Person  Provider:   Minus Breeding, MD    Other Instructions We will schedule you for an appointment with Dr. Debara Pickett, MD for lipids.

## 2021-08-25 LAB — LIPOPROTEIN A (LPA): Lipoprotein (a): 12 nmol/L (ref <75.0)

## 2021-08-31 ENCOUNTER — Encounter: Payer: Self-pay | Admitting: *Deleted

## 2021-09-03 ENCOUNTER — Ambulatory Visit (HOSPITAL_COMMUNITY): Payer: 59 | Attending: Cardiology

## 2021-09-03 ENCOUNTER — Other Ambulatory Visit: Payer: Self-pay

## 2021-09-03 DIAGNOSIS — E785 Hyperlipidemia, unspecified: Secondary | ICD-10-CM | POA: Diagnosis not present

## 2021-09-03 DIAGNOSIS — I10 Essential (primary) hypertension: Secondary | ICD-10-CM | POA: Diagnosis not present

## 2021-09-03 DIAGNOSIS — I480 Paroxysmal atrial fibrillation: Secondary | ICD-10-CM | POA: Diagnosis not present

## 2021-09-03 DIAGNOSIS — I340 Nonrheumatic mitral (valve) insufficiency: Secondary | ICD-10-CM | POA: Diagnosis not present

## 2021-09-03 DIAGNOSIS — I341 Nonrheumatic mitral (valve) prolapse: Secondary | ICD-10-CM | POA: Insufficient documentation

## 2021-09-03 DIAGNOSIS — I2510 Atherosclerotic heart disease of native coronary artery without angina pectoris: Secondary | ICD-10-CM | POA: Diagnosis not present

## 2021-09-03 LAB — ECHOCARDIOGRAM COMPLETE
Area-P 1/2: 1.5 cm2
P 1/2 time: 1018 msec
S' Lateral: 2.6 cm

## 2021-09-04 ENCOUNTER — Other Ambulatory Visit: Payer: Self-pay | Admitting: *Deleted

## 2021-09-04 DIAGNOSIS — I351 Nonrheumatic aortic (valve) insufficiency: Secondary | ICD-10-CM

## 2021-09-06 ENCOUNTER — Other Ambulatory Visit: Payer: Self-pay | Admitting: Cardiology

## 2021-09-07 ENCOUNTER — Other Ambulatory Visit: Payer: Self-pay | Admitting: Cardiology

## 2021-09-07 ENCOUNTER — Other Ambulatory Visit (HOSPITAL_COMMUNITY): Payer: Self-pay

## 2021-09-07 MED ORDER — ROSUVASTATIN CALCIUM 20 MG PO TABS
20.0000 mg | ORAL_TABLET | Freq: Every day | ORAL | 3 refills | Status: DC
  Filled 2021-09-07: qty 90, 90d supply, fill #0
  Filled 2021-12-08: qty 90, 90d supply, fill #1

## 2021-09-08 ENCOUNTER — Other Ambulatory Visit (HOSPITAL_COMMUNITY): Payer: Self-pay

## 2021-09-15 ENCOUNTER — Other Ambulatory Visit (HOSPITAL_COMMUNITY): Payer: Self-pay

## 2021-09-15 ENCOUNTER — Encounter: Payer: Self-pay | Admitting: Cardiology

## 2021-09-15 MED ORDER — LOSARTAN POTASSIUM-HCTZ 100-12.5 MG PO TABS
1.0000 | ORAL_TABLET | Freq: Every day | ORAL | 3 refills | Status: DC
Start: 2021-09-15 — End: 2022-03-01
  Filled 2021-09-15: qty 90, 90d supply, fill #0
  Filled 2021-12-08: qty 90, 90d supply, fill #1

## 2021-09-15 NOTE — Telephone Encounter (Signed)
Per last MD note:  HTN: This is just above where I would like it to be.  If he finds out that his systolics are maintaining in the 130s I will probably increase his Cozaar to 50 mg.   There is not correspondence after this note where patient was advised to increase med nor was it on the AVS

## 2021-12-09 ENCOUNTER — Other Ambulatory Visit (HOSPITAL_COMMUNITY): Payer: Self-pay

## 2021-12-30 ENCOUNTER — Telehealth: Payer: 59 | Admitting: Internal Medicine

## 2022-01-07 ENCOUNTER — Other Ambulatory Visit (HOSPITAL_COMMUNITY): Payer: Self-pay

## 2022-02-19 ENCOUNTER — Other Ambulatory Visit (HOSPITAL_COMMUNITY): Payer: Self-pay

## 2022-02-22 ENCOUNTER — Other Ambulatory Visit (HOSPITAL_COMMUNITY): Payer: Self-pay

## 2022-03-01 ENCOUNTER — Other Ambulatory Visit: Payer: Self-pay

## 2022-03-01 MED ORDER — LOSARTAN POTASSIUM-HCTZ 100-12.5 MG PO TABS: 1.0000 | ORAL_TABLET | Freq: Every day | ORAL | 3 refills | Status: DC

## 2022-04-23 DIAGNOSIS — Z23 Encounter for immunization: Secondary | ICD-10-CM | POA: Diagnosis not present

## 2022-04-30 DIAGNOSIS — Z23 Encounter for immunization: Secondary | ICD-10-CM | POA: Diagnosis not present

## 2022-05-17 ENCOUNTER — Encounter: Payer: Self-pay | Admitting: Family Medicine

## 2022-05-17 ENCOUNTER — Ambulatory Visit (INDEPENDENT_AMBULATORY_CARE_PROVIDER_SITE_OTHER): Payer: Medicare Other | Admitting: Family Medicine

## 2022-05-17 VITALS — BP 116/60 | HR 54 | Temp 97.2°F | Ht 73.0 in | Wt 166.8 lb

## 2022-05-17 DIAGNOSIS — I480 Paroxysmal atrial fibrillation: Secondary | ICD-10-CM | POA: Diagnosis not present

## 2022-05-17 DIAGNOSIS — R351 Nocturia: Secondary | ICD-10-CM | POA: Diagnosis not present

## 2022-05-17 DIAGNOSIS — R739 Hyperglycemia, unspecified: Secondary | ICD-10-CM | POA: Insufficient documentation

## 2022-05-17 DIAGNOSIS — N401 Enlarged prostate with lower urinary tract symptoms: Secondary | ICD-10-CM | POA: Diagnosis not present

## 2022-05-17 DIAGNOSIS — E785 Hyperlipidemia, unspecified: Secondary | ICD-10-CM | POA: Diagnosis not present

## 2022-05-17 DIAGNOSIS — I10 Essential (primary) hypertension: Secondary | ICD-10-CM

## 2022-05-17 DIAGNOSIS — Z0000 Encounter for general adult medical examination without abnormal findings: Secondary | ICD-10-CM

## 2022-05-17 LAB — URINALYSIS, ROUTINE W REFLEX MICROSCOPIC
Bilirubin Urine: NEGATIVE
Hgb urine dipstick: NEGATIVE
Ketones, ur: NEGATIVE
Leukocytes,Ua: NEGATIVE
Nitrite: NEGATIVE
RBC / HPF: NONE SEEN (ref 0–?)
Specific Gravity, Urine: 1.015 (ref 1.000–1.030)
Total Protein, Urine: NEGATIVE
Urine Glucose: NEGATIVE
Urobilinogen, UA: 0.2 (ref 0.0–1.0)
pH: 7 (ref 5.0–8.0)

## 2022-05-17 LAB — CBC WITH DIFFERENTIAL/PLATELET
Basophils Absolute: 0 10*3/uL (ref 0.0–0.1)
Basophils Relative: 0.6 % (ref 0.0–3.0)
Eosinophils Absolute: 0.1 10*3/uL (ref 0.0–0.7)
Eosinophils Relative: 1.8 % (ref 0.0–5.0)
HCT: 43.1 % (ref 39.0–52.0)
Hemoglobin: 14.2 g/dL (ref 13.0–17.0)
Lymphocytes Relative: 14.3 % (ref 12.0–46.0)
Lymphs Abs: 0.7 10*3/uL (ref 0.7–4.0)
MCHC: 33 g/dL (ref 30.0–36.0)
MCV: 96.3 fl (ref 78.0–100.0)
Monocytes Absolute: 0.6 10*3/uL (ref 0.1–1.0)
Monocytes Relative: 12.4 % — ABNORMAL HIGH (ref 3.0–12.0)
Neutro Abs: 3.2 10*3/uL (ref 1.4–7.7)
Neutrophils Relative %: 70.9 % (ref 43.0–77.0)
Platelets: 163 10*3/uL (ref 150.0–400.0)
RBC: 4.48 Mil/uL (ref 4.22–5.81)
RDW: 14.1 % (ref 11.5–15.5)
WBC: 4.5 10*3/uL (ref 4.0–10.5)

## 2022-05-17 LAB — LIPID PANEL
Cholesterol: 200 mg/dL (ref 0–200)
HDL: 108.6 mg/dL (ref >39.00)
LDL Cholesterol: 79 mg/dL (ref 0–99)
NonHDL: 91.35
Total CHOL/HDL Ratio: 2
Triglycerides: 62 mg/dL (ref 0.0–149.0)
VLDL: 12.4 mg/dL (ref 0.0–40.0)

## 2022-05-17 LAB — HEMOGLOBIN A1C: Hgb A1c MFr Bld: 6.1 % (ref 4.6–6.5)

## 2022-05-17 LAB — COMPREHENSIVE METABOLIC PANEL
ALT: 21 U/L (ref 0–53)
AST: 23 U/L (ref 0–37)
Albumin: 4.4 g/dL (ref 3.5–5.2)
Alkaline Phosphatase: 52 U/L (ref 39–117)
BUN: 21 mg/dL (ref 6–23)
CO2: 30 mEq/L (ref 19–32)
Calcium: 9.6 mg/dL (ref 8.4–10.5)
Chloride: 104 mEq/L (ref 96–112)
Creatinine, Ser: 1.18 mg/dL (ref 0.40–1.50)
GFR: 62.76 mL/min (ref >60.00)
Glucose, Bld: 91 mg/dL (ref 70–99)
Potassium: 4.1 mEq/L (ref 3.5–5.1)
Sodium: 142 mEq/L (ref 135–145)
Total Bilirubin: 0.9 mg/dL (ref 0.2–1.2)
Total Protein: 7.2 g/dL (ref 6.0–8.3)

## 2022-05-17 LAB — PSA: PSA: 0.64 ng/mL (ref 0.10–4.00)

## 2022-05-17 MED ORDER — LOSARTAN POTASSIUM 50 MG PO TABS: 50.0000 mg | ORAL_TABLET | Freq: Every day | ORAL | 3 refills | Status: DC

## 2022-05-17 MED ORDER — HYDROCHLOROTHIAZIDE 12.5 MG PO TABS: 12.5000 mg | ORAL_TABLET | Freq: Every day | ORAL | 3 refills | Status: DC

## 2022-05-17 NOTE — Patient Instructions (Addendum)
Please stop by lab before you go If you have mychart- we will send your results within 3 business days of [place] receiving them.  If you do not have mychart- we will call you about results within 5 business days of [place] receiving them.  *please also note that you will see labs on mychart as soon as they post. I will later go in and write notes on them- will say "notes from Dr. Yong Channel"   blood pressure running low if takes meds in am- we will split dose losartan 50 mg (reduce) in PM and hctz 12.5 mg in AM and he will update me if blood pressure elevates too much or doesn't increase- prefer all readings over 105 ideally  Recommended follow up: Return in about 1 year (around 05/18/2023) for physical or sooner if needed.Schedule b4 you leave.

## 2022-05-17 NOTE — Progress Notes (Addendum)
Phone: [phone]   Subjective:  Patient presents today for their annual physical. Chief complaint-noted.   See problem oriented charting- ROS- full  review of systems was completed and negative  except for: palpitations- follows with cardiology  The following were reviewed and entered/updated in epic: Past Medical History:  Diagnosis Date   GERD (gastroesophageal reflux disease)    Hyperlipidemia    Hypertension    Paroxysmal atrial fibrillation (HCC)    Partial tear of rotator cuff 06/27/2015   Post concussive encephalopathy 12/26/2013   Seizure after head injury ([place]) 12/14/2013   Shoulder pain, left    Patient Active Problem List   Diagnosis Date Noted   Elevated coronary artery calcium score 04/28/2017    Priority: High   Post concussive encephalopathy 12/26/2013    Priority: High   History of seizure- after head injury/electrocution 12/14/2013    Priority: High   Paroxysmal atrial fibrillation ([place]) 09/22/2009    Priority: High   Hyperglycemia 05/17/2022    Priority: Medium    History of adenomatous polyp of colon 06/26/2019    Priority: Medium    BPH (benign prostatic hyperplasia) 07/18/2014    Priority: Medium    Hyperlipidemia 01/02/2008    Priority: Medium    Essential hypertension 01/02/2008    Priority: Medium    Other fatigue 07/24/2018    Priority: Low   Coronary artery disease involving native coronary artery of native heart without angina pectoris 08/10/2020    Priority: 1.   Lumbar spinal stenosis 05/14/2019    Priority: 1.   MVP (mitral valve prolapse) 08/10/2020   Nonallopathic lesion of lumbosacral region 07/31/2019   Nonallopathic lesion of sacral region 07/31/2019   Nonallopathic lesion of thoracic region 07/31/2019   Nonrheumatic aortic valve insufficiency 07/07/2019   Partial tear of rotator cuff 06/27/2015   Past Surgical History:  Procedure Laterality Date   ATRIAL FIBRILLATION ABLATION N/A 09/07/2018   Procedure: ATRIAL FIBRILLATION  ABLATION;  Surgeon: Thompson Grayer, MD;  Location: [facility];  Service: Cardiovascular;  Laterality: N/A;   BICEPS TENDON REPAIR     COLONOSCOPY     knee scopes Bilateral    NASAL SEPTUM SURGERY     right inguinal hernia     child   SHOULDER ACROMIOPLASTY Left 06/27/2015   Procedure: SHOULDER ACROMIOPLASTY DEBRIDE LABRUM;  Surgeon: [name], MD;  Location: [facility];  Service: Orthopedics;  Laterality: Left;   SHOULDER ARTHROSCOPY WITH ROTATOR CUFF REPAIR Left 06/27/2015   Procedure: SHOULDER ARTHROSCOPY WITH ROTATOR CUFF REPAIR;  Surgeon: [name], MD;  Location: [facility];  Service: Orthopedics;  Laterality: Left;   TONSILECTOMY/ADENOIDECTOMY WITH MYRINGOTOMY     child   UPPER GASTROINTESTINAL ENDOSCOPY      Family History  Problem Relation Age of Onset   Cancer - Other Father        renal cell metastatic   CAD Father 50   Hypertension Brother        smoker   CAD Brother 29       stent, smoker   Colon cancer Neg Hx    Colon polyps Neg Hx    Esophageal cancer Neg Hx    Stomach cancer Neg Hx    Rectal cancer Neg Hx     Medications- reviewed and updated Current Outpatient Medications  Medication Sig Dispense Refill   aspirin EC 81 MG tablet Take 1 tablet (81 mg total) by mouth daily. Swallow whole. 30 tablet 11   hydrochlorothiazide (  HYDRODIURIL) 12.5 MG tablet Take 1 tablet (12.5 mg total) by mouth daily. In the morning 90 tablet 3   losartan (COZAAR) 50 MG tablet Take 1 tablet (50 mg total) by mouth daily. Before bed 90 tablet 3   rosuvastatin (CRESTOR) 20 MG tablet Take 1 tablet (20 mg total) by mouth daily. 90 tablet 3   No current facility-administered medications for this visit.    Allergies-reviewed and updated Allergies  Allergen Reactions   Cardura [Doxazosin Mesylate]     Caused patient to go into a. Fib.   Simvastatin     muscle aches    Social History   Social History Narrative   Married (2nd marriage  in 2010). 3 children from first marriage. 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   Patient drinks about 1 cup of caffeine daily and occasionally has a soda.    Objective  Objective:  BP 116/60   Pulse (!) 54   Temp (!) 97.2 F (36.2 C)   Ht '6\' 1"'$  (1.854 m)   Wt 166 lb 12.8 oz (75.7 kg)   SpO2 98%   BMI 22.01 kg/m  Gen: NAD, resting comfortably HEENT: Mucous membranes are moist. Oropharynx normal Neck: no thyromegaly CV: RRR no murmurs rubs or gallops Lungs: CTAB no crackles, wheeze, rhonchi Abdomen: soft/nontender/nondistended/normal bowel sounds. No rebound or guarding.  Ext: no edema Skin: warm, dry Neuro: grossly normal, moves all extremities, PERRLA    Assessment and Plan  71 y.o. male presenting for annual follow up   Health Maintenance counseling: 1. Anticipatory guidance: Patient counseled regarding regular dental exams -q6 months, eye exams - recommended yearly- is going to schedule,  avoiding smoking and second hand smoke , limiting alcohol to 2 beverages per day - 1/2 glass of wine once a week, no illicit drugs.   2. Risk factor reduction:  Advised patient of need for regular exercise and diet rich and fruits and vegetables to reduce risk of heart attack and stroke.  Exercise- 3 days a week at gym and walking dog 4-5 miles a day.  Diet/weight management-discussed maintenance goal.  Wt Readings from Last 3 Encounters:  05/17/22 166 lb 12.8 oz (75.7 kg)  08/24/21 175 lb 9.6 oz (79.7 kg)  05/13/21 170 lb (77.1 kg)  3. Immunizations/screenings/ancillary studies-  wonders if had covid early June but also has several grandkids was around. RSV  - consider in future Immunization History  Administered Date(s) Administered   Fluad Quad(high Dose 65+) 04/18/2019, 05/12/2020, 04/23/2022   Influenza Split 04/15/2016   Influenza Whole 04/11/2009   Influenza, High Dose Seasonal PF 05/04/2018   Influenza,inj,Quad PF,6+ Mos 04/25/2017   Influenza-Unspecified 04/23/2014   PFIZER Comirnaty(Gray Top)Covid-19 Tri-Sucrose Vaccine 04/30/2022   PFIZER(Purple Top)SARS-COV-2 Vaccination 08/24/2019, 09/14/2019, 05/06/2020, 11/06/2020   Pneumococcal Conjugate-13 05/04/2018   Pneumococcal Polysaccharide-23 05/07/2019   Td 06/09/2009   Tdap 12/07/2013   Zoster Recombinat (Shingrix) 05/31/2018, 09/27/2018   Zoster, Live 07/18/2014  4. Prostate cancer screening- nocturia twice a night but hctz related and trying  to adjust. Has to do some double voiding. We will trend PSA with labs again this year.   Lab Results  Component Value Date   PSA 0.69 05/13/2021   PSA 0.58 05/12/2020   PSA 0.61 05/07/2019  5. Colon cancer screening - colonoscopy 06/19/19, with 7 year repeat  6. Skin cancer screening-  saw derm within last year did efudex - advised regular sunscreen use. Denies worrisome, changing, or new skin lesions.  7. Smoking associated screening (lung cancer screening, AAA screen 65-75, UA)- never smoker 8. STD screening - monogamous  so not needed   Status of chronic or acute concerns   # Atrial fibrillation s/p ablation without recurrence.  Still with PVCs. Sees Dr. Percival Spanish  S: Rate controlled with no rx.  cardizem only as needed in past  Anticoagulated with no rx- on asa only post ablation A/P: doing well post ablation- continue cardiology follow up   #MVP- stable murmur and largley stable echocardiograms followed by cardiology - will get yearly- last done 09/03/21- moderate mitral regurg and moderate AI- mild MVP  #hyperlipidemia S: Medication: rosuvastatin 20 mg  (but worried about a1c). Prefer LDL under 70 due to coronary calcium score of  204 in 2020 65% for age -lipoprotein a not elevated -tends to get upper abdominal pain at times and wonders if related to statin and can radiate to back- wonders about pancreatitis- self resolves thankfully. Imaging of aorta ok in 2015 Lab Results  Component Value Date   CHOL 200 05/13/2021   HDL 111.20 05/13/2021   LDLCALC 76 05/13/2021   LDLDIRECT 151.1 08/12/2009   TRIG 65.0 05/13/2021   CHOLHDL 2 05/13/2021   A/P: update lipids- continue current meds for now- did discuss possible zeta '10mg'$  and rosuvastatin 10 mg option if continues to feel has SE on statin  #hypertension S: medication: losartan hctz 100-12.5 mg Home readings #s: systolics into 00X if takes med in morning- tries to take in afternoon- but if takes too late bothers him at night with urination BP Readings from Last 3 Encounters:  05/17/22 116/60  08/24/21 132/62  05/13/21 126/73  A/P: blood pressure running low if takes meds in am- we will split dose losartan 50 mg (reduce) in PM and hctz 12.5 mg in AM and he will update me if blood pressure elevates too much or doesn't increase- prefer all readings over 105 ideally  # Hyperglycemia/insulin resistance/prediabetes- peak a1c 6.1 in oct 2022 S:  Medication: none CBGs- vast majority of sugars under 180 2 hours after eating - has actually noted some lows at time after breakfast with activity- fruit will help this- but interestingly as low as 40s- noted as was feeling jittery  A/P: update a1c- discussed nighttime snack night before if knows going to be active or bump foods he eats. Common trigger would be more muffin, honey etc- does ok with eggs/toast- has adapted and avoiding  #postconcussive encephalopathy. 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  Lab/Order associations: fasting   ICD-10-CM   1. Preventative health care  Z00.00     2. Hyperlipidemia, unspecified hyperlipidemia type  E78.5 CBC with Differential/Platelet    Comprehensive metabolic panel    Lipid panel    3. Hyperglycemia  R73.9 HgB A1c    4. Benign prostatic hyperplasia with nocturia  N40.1 PSA   R35.1     5. Paroxysmal atrial fibrillation (HCC)  I48.0      Meds ordered this encounter  Medications   losartan (COZAAR) 50 MG tablet    Sig: Take 1 tablet (50 mg total) by mouth daily. Before bed    Dispense:  90 tablet    Refill:  3   hydrochlorothiazide (HYDRODIURIL) 12.5 MG tablet    Sig: Take 1 tablet (12.5 mg total) by mouth daily. In the morning    Dispense:  90 tablet    Refill:  3   Time Spent: 35 minutes of total time (8:40 AM- 9:15 AM) was spent on the date of the encounter performing the following actions: chart review prior to seeing the patient, obtaining history, performing a medically necessary exam, counseling on the treatment plan as well as health maintenance items, placing orders, and documenting in our EHR.    Return precautions advised.  Garret Reddish, MD

## 2022-05-17 NOTE — Addendum Note (Signed)
Addended by: [name] on: 05/17/2022 09:16 AM   Modules accepted: Orders, Level of Service

## 2022-07-05 ENCOUNTER — Telehealth: Payer: Self-pay | Admitting: Family Medicine

## 2022-07-05 NOTE — Telephone Encounter (Signed)
Copied from [place] [id number]. Topic: Medicare AWV >> Jul 05, 2022 10:35 AM Gillis Santa wrote: Reason for CRM: tried to reach patient to schedule AWVI [place].Marland KitchenMarland KitchenPHONE WAS ANSWERED BUT NO ONE SPOKE WHEN I TRIED TO GET A RESPONSE SO UNABLE TO LEAVE MESSAGE IF [place]

## 2022-07-08 ENCOUNTER — Ambulatory Visit (INDEPENDENT_AMBULATORY_CARE_PROVIDER_SITE_OTHER): Payer: Medicare Other

## 2022-07-08 VITALS — Wt 166.0 lb

## 2022-07-08 DIAGNOSIS — Z0000 Encounter for general adult medical examination without abnormal findings: Secondary | ICD-10-CM | POA: Diagnosis not present

## 2022-07-08 NOTE — Progress Notes (Signed)
I connected with  Nicholas Perry on 07/08/22 by a audio enabled telemedicine application and verified that I am speaking with the correct person using two identifiers.  Patient Location: Home  Provider Location: Office/Clinic  I discussed the limitations of evaluation and management by telemedicine. The patient expressed understanding and agreed to proceed.   Subjective:   Nicholas Perry is a 71 y.o. male who presents for an Initial Medicare Annual Wellness Visit.  Review of Systems     Cardiac Risk Factors include: advanced age (>15mn, >>13women);dyslipidemia;male gender;hypertension     Objective:    Today's Vitals   07/08/22 1034  Weight: 166 lb (75.3 kg)   Body mass index is 21.9 kg/m.     07/08/2022   10:38 AM 09/08/2018    9:02 AM 09/07/2018    9:19 AM 06/27/2015    6:24 AM 06/24/2015   10:18 AM 04/29/2015   11:04 AM 08/23/2014    9:02 AM  Advanced Directives  Does Patient Have a Medical Advance Directive? Yes No No Yes;No Yes No No  Type of AAcademic librarianLiving will    HPurcellLiving will    Does patient want to make changes to medical advance directive?   No - Patient declined      Copy of [place] Chart? No - copy requested    Yes    Would patient like information on creating a medical advance directive?  No - Patient declined         Current Medications (verified) Outpatient Encounter Medications as of 07/08/2022  Medication Sig   aspirin EC 81 MG tablet Take 1 tablet (81 mg total) by mouth daily. Swallow whole.   hydrochlorothiazide (HYDRODIURIL) 12.5 MG tablet Take 1 tablet (12.5 mg total) by mouth daily. In the morning   losartan (COZAAR) 50 MG tablet Take 1 tablet (50 mg total) by mouth daily. Before bed   rosuvastatin (CRESTOR) 20 MG tablet Take 1 tablet (20 mg total) by mouth daily.   [DISCONTINUED] rivaroxaban (XARELTO) 20 MG TABS tablet Take 1 tablet (20 mg total) by mouth daily with  supper.   No facility-administered encounter medications on file as of 07/08/2022.    Allergies (verified) Cardura [doxazosin mesylate] and Simvastatin   History: Past Medical History:  Diagnosis Date   GERD (gastroesophageal reflux disease)    Hyperlipidemia    Hypertension    Paroxysmal atrial fibrillation (HCC)    Partial tear of rotator cuff 06/27/2015   Post concussive encephalopathy 12/26/2013   Seizure [place] injury [place] 12/14/2013   Shoulder pain, left    Past Surgical History:  Procedure Laterality Date   ATRIAL FIBRILLATION ABLATION N/A 09/07/2018   Procedure: ATRIAL FIBRILLATION ABLATION;  Surgeon: AThompson Grayer MD;  [facility];  Service: Cardiovascular;  Laterality: N/A;   BICEPS TENDON REPAIR     COLONOSCOPY     knee scopes Bilateral    NASAL SEPTUM SURGERY     right inguinal hernia     child   SHOULDER ACROMIOPLASTY Left 06/27/2015   Procedure: SHOULDER ACROMIOPLASTY DEBRIDE LABRUM;  Surgeon: JMarchia Bond MD;  [facility]  Service: Orthopedics;  Laterality: Left;   SHOULDER ARTHROSCOPY WITH ROTATOR CUFF REPAIR Left 06/27/2015   Procedure: SHOULDER ARTHROSCOPY WITH ROTATOR CUFF REPAIR;  Surgeon: JMarchia Bond MD;  [facility]  Service: Orthopedics;  Laterality: Left;   TONSILECTOMY/ADENOIDECTOMY WITH MYRINGOTOMY     child  UPPER GASTROINTESTINAL ENDOSCOPY     Family History  Problem Relation Age of Onset   Cancer - Other Father        renal cell metastatic   CAD Father 27   Hypertension Brother        smoker   CAD Brother 21       stent, smoker   Colon cancer Neg Hx    Colon polyps Neg Hx    Esophageal cancer Neg Hx    Stomach cancer Neg Hx    Rectal cancer Neg Hx    Social History   Socioeconomic History   Marital status: Married    Spouse name: Not on file   Number of children: 3   Years of education: MD   Highest education level: Not on file  Occupational History    Occupation: N/A  Tobacco Use   Smoking status: Never   Smokeless tobacco: Never  Vaping Use   Vaping Use: Never used  Substance and Sexual Activity   Alcohol use: Yes    Alcohol/week: 7.0 - 14.0 standard drinks of alcohol    Types: 7 - 14 Standard drinks or equivalent per week    Comment: 1/2 glass a week   Drug use: No   Sexual activity: Not on file  Other Topics Concern   Not on file  Social History Narrative   Married (2nd marriage in 2010). 3 children from first marriage. 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   Patient drinks about 1 cup of caffeine daily and occasionally has a soda.    Social Determinants of Health   Financial Resource Strain: Low Risk  (07/08/2022)   Overall Financial Resource Strain (CARDIA)    Difficulty of Paying Living Expenses: Not hard at all  Food Insecurity: No Food Insecurity (07/08/2022)   Hunger Vital Sign    Worried About Running Out of Food in the Last Year: Never true    Ran Out of Food in the Last Year: Never true  Transportation Needs: No Transportation Needs (07/08/2022)   PRAPARE - Hydrologist (Medical): No    Lack of Transportation (Non-Medical): No  Physical Activity: Sufficiently Active (07/08/2022)   Exercise Vital Sign    Days of Exercise per Week: 7 days    Minutes of Exercise per Session: 60 min  Stress: No Stress Concern Present (07/08/2022)   [place]    Feeling of Stress : Not at all  Social Connections: Moderately  Isolated (07/08/2022)   Social Connection and Isolation Panel [NHANES]    Frequency of Communication with Friends and Family: More than three times a week    Frequency of Social Gatherings with Friends and Family: More than three times a week    Attends Religious Services: Never    Marine scientist or Organizations: No    Attends Archivist Meetings: Never    Marital Status: Married    Tobacco Counseling Counseling given:  Not Answered   Clinical Intake:  Pre-visit preparation completed: Yes  Pain : No/denies pain     BMI - recorded: 21.9 Nutritional Status: BMI of 19-24  Normal Nutritional Risks: None Diabetes: No  How often do you need to have someone help you when you read instructions, pamphlets, or other written materials from your doctor or pharmacy?: 1 - Never  Diabetic?no  Interpreter Needed?: No  Information entered by :: Charlott Rakes, LPN   Activities of Daily Living    07/08/2022   10:39 AM 07/06/2022    6:59 AM  In your present state of health, do you have any difficulty performing the following activities:  Hearing? 0 0  Vision? 0 1  Difficulty concentrating or making decisions? 0 1  Walking or climbing stairs? 0 0  Dressing or bathing? 0 0  Doing errands, shopping? 0 0  Preparing Food and eating ? N N  Using the Toilet? N N  In the past six months, have you accidently leaked urine? N N  Do you have problems with loss of bowel control? N N  Managing your Medications? N N  Managing your Finances? N N  Housekeeping or managing your Housekeeping? N N    Patient Care Team: [name], MD as PCP - General (Family Medicine) Minus Breeding, MD as PCP - Cardiology (Cardiology) Jamey Ripa, PhD (Inactive) as Consulting Physician (Psychology) Minus Breeding, MD as Consulting Physician (Cardiology)  Indicate any recent Medical Services you may have received from other than [facility] providers in the past year (date may be  approximate).     Assessment:   This is a routine wellness examination for [employer].  Hearing/Vision screen Hearing Screening - Comments:: Pt denies any hearing issues  Vision Screening - Comments:: Pt follows up with [facility] for annul eye exams will follow up with Dr Clent Jacks   Dietary issues and exercise activities discussed: Current Exercise Habits: Home exercise routine, Type of exercise: walking;Other - see comments, Time (Minutes): 60, Frequency (Times/Week): 7, Weekly Exercise (Minutes/Week): 420   Goals Addressed             This Visit's Progress    Patient Stated       Stay healthy        Depression Screen    07/08/2022   10:37 AM 05/17/2022    8:20 AM 05/13/2021    9:14 AM 05/12/2020    9:40 AM 04/05/2019    1:57 PM 05/04/2018    9:39 AM 04/25/2017    1:50 PM  PHQ 2/9 Scores  PHQ - 2 Score 0 0 0 0 0 0 0  PHQ- 9 Score 0 0         Fall Risk    07/08/2022   10:39 AM 07/06/2022    6:59 AM 05/17/2022    8:20 AM 05/13/2021    9:14 AM 05/12/2020    9:40 AM  Fall Risk   Falls in the past year? 0 0 0 0 0  Number falls in past yr: 0 0 0 0 0  Injury with Fall? 0 0 0 0 0  Risk for fall due to : Impaired vision;Impaired balance/gait  No Fall Risks No Fall Risks   Risk for fall due to: Comment balance due to injury in past      Follow up Falls prevention discussed  Falls evaluation completed Falls evaluation completed     [place]:  Any stairs in or  around the home? Yes  If so, are there any without handrails? No  Home free of loose throw rugs in walkways, pet beds, electrical cords, etc? Yes  Adequate lighting in your home to reduce risk of falls? Yes   ASSISTIVE DEVICES UTILIZED TO PREVENT FALLS:  Life alert? No  Use of a cane, walker or w/c? No  Grab bars in the bathroom? No  Shower chair or bench in shower? Yes  Elevated toilet seat or a handicapped toilet? No   TIMED UP AND GO:  Was the test performed? No .    Cognitive Function:        07/08/2022   10:41 AM  6CIT Screen  What Year? 0 points  What month? 0 points  What time? 0 points  Count back from 20 0 points  Months in reverse 0 points  Repeat phrase 0 points  Total Score 0 points    Immunizations Immunization History  Administered Date(s) Administered   Fluad Quad(high Dose 65+) 04/18/2019, 05/12/2020, 04/23/2022   Influenza Split 04/15/2016   Influenza Whole 04/11/2009   Influenza, High Dose Seasonal PF 05/04/2018   Influenza,inj,Quad PF,6+ Mos 04/25/2017   Influenza-Unspecified 04/23/2014, 04/14/2021   PFIZER Comirnaty(Gray Top)Covid-19 Tri-Sucrose Vaccine 04/30/2022   PFIZER(Purple Top)SARS-COV-2 Vaccination 08/24/2019, 09/14/2019, 05/06/2020, 11/06/2020   Pneumococcal Conjugate-13 05/04/2018   Pneumococcal Polysaccharide-23 05/07/2019   Td 06/09/2009   Tdap 12/07/2013   Zoster Recombinat (Shingrix) 05/31/2018, 09/27/2018   Zoster, Live 07/18/2014    TDAP status: Up to date  Flu Vaccine status: Up to date  Pneumococcal vaccine status: Up to date  Covid-19 vaccine status: Completed vaccines  Qualifies for Shingles Vaccine? Yes   Zostavax completed Yes   Shingrix Completed?: Yes  Screening Tests Health Maintenance  Topic Date Due   COVID-19 Vaccine (6 - 2023-24 season) 06/25/2022   Medicare Annual Wellness (AWV)  07/09/2023   DTaP/Tdap/Td (3 - Td or Tdap) 12/08/2023   COLONOSCOPY (Pts 45-48yr Insurance coverage will need to be confirmed)  06/18/2026   Pneumonia Vaccine 70 Years old  Completed   INFLUENZA VACCINE  Completed   Hepatitis C Screening  Completed   Zoster Vaccines- Shingrix  Completed   HPV VACCINES  Aged Out    Health Maintenance  Health Maintenance Due  Topic Date Due   COVID-19 Vaccine (6 - 2023-24 season) 06/25/2022    Colorectal cancer screening: Type of screening: Colonoscopy. Completed 06/19/19. Repeat every 7 years   Additional Screening:  Hepatitis C Screening:  Completed 06/02/15  Vision Screening: Recommended annual ophthalmology exams for early detection of glaucoma and other disorders of the eye. Is the patient up to date with their annual eye exam?  Yes  Who is the provider or what is the name of the office in which the patient attends annual eye exams? Has an appt with Dr RClent Jacks If pt is not established with a provider, would they like to be referred to a provider to establish care? No .   Dental Screening: Recommended annual dental exams for proper oral hygiene  Community Resource Referral / Chronic Care Management: CRR required this visit?  No   CCM required this visit?  No      Plan:     I have personally reviewed and noted the following in the patient's chart:   Medical and social history Use of alcohol, tobacco or illicit drugs  Current medications and supplements including opioid prescriptions. Patient is not currently taking opioid prescriptions. Functional ability and status Nutritional  status Physical activity Advanced directives List of other physicians Hospitalizations, surgeries, and ER visits in previous 12 months Vitals Screenings to include cognitive, depression, and falls Referrals and appointments  In addition, I have reviewed and discussed with patient certain preventive protocols, quality metrics, and best practice recommendations. A written personalized care plan for preventive services as well as general preventive health recommendations were provided to patient.     Willette Brace, LPN   [id number]   Nurse Notes: none

## 2022-07-09 ENCOUNTER — Telehealth: Payer: Medicare Other | Admitting: Physician Assistant

## 2022-07-09 DIAGNOSIS — B9689 Other specified bacterial agents as the cause of diseases classified elsewhere: Secondary | ICD-10-CM | POA: Diagnosis not present

## 2022-07-09 DIAGNOSIS — J019 Acute sinusitis, unspecified: Secondary | ICD-10-CM

## 2022-07-09 MED ORDER — AMOXICILLIN-POT CLAVULANATE 875-125 MG PO TABS: 1.0000 | ORAL_TABLET | Freq: Two times a day (BID) | ORAL | 0 refills | Status: DC

## 2022-07-09 MED ORDER — PROMETHAZINE-DM 6.25-15 MG/5ML PO SYRP: 5.0000 mL | ORAL_SOLUTION | Freq: Four times a day (QID) | ORAL | 0 refills | Status: DC | PRN

## 2022-07-09 NOTE — Progress Notes (Signed)
E-Visit for Sinus Problems  We are sorry that you are not feeling well.  Here is how we plan to help!  Based on what you have shared with me it looks like you have sinusitis.  Sinusitis is inflammation and infection in the sinus cavities of the head.  Based on your presentation I believe you most likely have Acute Bacterial Sinusitis.  This is an infection caused by bacteria and is treated with antibiotics. I have prescribed Augmentin '875mg'$ /'125mg'$  one tablet twice daily with food, for 7 days. You may use an oral decongestant such as Mucinex D or if you have glaucoma or high blood pressure use plain Mucinex. Saline nasal spray help and can safely be used as often as needed for congestion.  If you develop worsening sinus pain, fever or notice severe headache and vision changes, or if symptoms are not better after completion of antibiotic, please schedule an appointment with a health care provider.  I have also prescribed Promethazine DM for the cough.  Sinus infections are not as easily transmitted as other respiratory infection, however we still recommend that you avoid close contact with loved ones, especially the very young and elderly.  Remember to wash your hands thoroughly throughout the day as this is the number one way to prevent the spread of infection!  Home Care: Only take medications as instructed by your medical team. Complete the entire course of an antibiotic. Do not take these medications with alcohol. A steam or ultrasonic humidifier can help congestion.  You can place a towel over your head and breathe in the steam from hot water coming from a faucet. Avoid close contacts especially the very young and the elderly. Cover your mouth when you cough or sneeze. Always remember to wash your hands.  Get Help Right Away If: You develop worsening fever or sinus pain. You develop a severe head ache or visual changes. Your symptoms persist after you have completed your treatment plan.  Make  sure you Understand these instructions. Will watch your condition. Will get help right away if you are not doing well or get worse.  Thank you for choosing an e-visit.  Your e-visit answers were reviewed by a board certified advanced clinical practitioner to complete your personal care plan. Depending upon the condition, your plan could have included both over the counter or prescription medications.  Please review your pharmacy choice. Make sure the pharmacy is open so you can pick up prescription now. If there is a problem, you may contact your provider through [employer] and have the prescription routed to another pharmacy.  Your safety is important to [place]. If you have drug allergies check your prescription carefully.   For the next 24 hours you can use MyChart to ask questions about today's visit, request a non-urgent call back, or ask for a work or school excuse. You will get an email in the next two days asking about your experience. I hope that your e-visit has been valuable and will speed your recovery.  I have spent 5 minutes in review of e-visit questionnaire, review and updating patient chart, medical decision making and response to patient.   Mar Daring, PA-C

## 2022-07-19 ENCOUNTER — Encounter: Payer: Self-pay | Admitting: Cardiology

## 2022-08-16 ENCOUNTER — Ambulatory Visit (HOSPITAL_COMMUNITY): Payer: Medicare Other | Attending: Cardiology

## 2022-08-16 DIAGNOSIS — I351 Nonrheumatic aortic (valve) insufficiency: Secondary | ICD-10-CM | POA: Diagnosis not present

## 2022-08-16 LAB — ECHOCARDIOGRAM COMPLETE
Area-P 1/2: 4.49 cm2
P 1/2 time: 699 msec
S' Lateral: 3 cm

## 2022-08-18 DIAGNOSIS — L820 Inflamed seborrheic keratosis: Secondary | ICD-10-CM | POA: Diagnosis not present

## 2022-08-18 DIAGNOSIS — D2262 Melanocytic nevi of left upper limb, including shoulder: Secondary | ICD-10-CM | POA: Diagnosis not present

## 2022-08-18 DIAGNOSIS — D225 Melanocytic nevi of trunk: Secondary | ICD-10-CM | POA: Diagnosis not present

## 2022-08-18 DIAGNOSIS — L570 Actinic keratosis: Secondary | ICD-10-CM | POA: Diagnosis not present

## 2022-08-18 DIAGNOSIS — D1801 Hemangioma of skin and subcutaneous tissue: Secondary | ICD-10-CM | POA: Diagnosis not present

## 2022-08-18 DIAGNOSIS — L821 Other seborrheic keratosis: Secondary | ICD-10-CM | POA: Diagnosis not present

## 2022-08-18 DIAGNOSIS — L814 Other melanin hyperpigmentation: Secondary | ICD-10-CM | POA: Diagnosis not present

## 2022-08-30 ENCOUNTER — Encounter: Payer: Self-pay | Admitting: Cardiology

## 2022-08-30 MED ORDER — ROSUVASTATIN CALCIUM 20 MG PO TABS: 20.0000 mg | ORAL_TABLET | Freq: Every day | ORAL | 0 refills | Status: DC

## 2022-08-31 DIAGNOSIS — H25813 Combined forms of age-related cataract, bilateral: Secondary | ICD-10-CM | POA: Diagnosis not present

## 2022-08-31 DIAGNOSIS — H35372 Puckering of macula, left eye: Secondary | ICD-10-CM | POA: Diagnosis not present

## 2022-08-31 DIAGNOSIS — Z9889 Other specified postprocedural states: Secondary | ICD-10-CM | POA: Diagnosis not present

## 2022-09-18 DIAGNOSIS — I061 Rheumatic aortic insufficiency: Secondary | ICD-10-CM | POA: Insufficient documentation
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
## 2022-09-20 ENCOUNTER — Encounter: Payer: Self-pay | Admitting: Cardiology

## 2022-09-20 ENCOUNTER — Ambulatory Visit: Payer: Medicare Other | Attending: Cardiology | Admitting: Cardiology

## 2022-09-20 VITALS — BP 124/70 | HR 55 | Ht 73.0 in | Wt 165.8 lb

## 2022-09-20 DIAGNOSIS — E785 Hyperlipidemia, unspecified: Secondary | ICD-10-CM

## 2022-09-20 DIAGNOSIS — I480 Paroxysmal atrial fibrillation: Secondary | ICD-10-CM

## 2022-09-20 DIAGNOSIS — I10 Essential (primary) hypertension: Secondary | ICD-10-CM

## 2022-09-20 DIAGNOSIS — I061 Rheumatic aortic insufficiency: Secondary | ICD-10-CM

## 2022-09-20 DIAGNOSIS — I2510 Atherosclerotic heart disease of native coronary artery without angina pectoris: Secondary | ICD-10-CM | POA: Diagnosis not present

## 2022-09-20 MED ORDER — EZETIMIBE 10 MG PO TABS: 10.0000 mg | ORAL_TABLET | Freq: Every day | ORAL | 3 refills | Status: DC

## 2022-09-20 MED ORDER — ROSUVASTATIN CALCIUM 20 MG PO TABS: 20.0000 mg | ORAL_TABLET | Freq: Every day | ORAL | 3 refills | Status: DC

## 2022-09-20 MED ORDER — LOSARTAN POTASSIUM 100 MG PO TABS: 100.0000 mg | ORAL_TABLET | Freq: Every day | ORAL | 3 refills | Status: DC

## 2022-09-20 NOTE — Patient Instructions (Addendum)
Medication Instructions:   START Zetia 10 mg daily- Take 1 tablet by mouth daily  *If you need a refill on your cardiac medications before your next appointment, please call your pharmacy*  Lab Work: Your physician recommends that you return for lab work in 3 months:  NMR Lipoprofile  If you have labs (blood work) drawn today and your tests are completely normal, you will receive your results only by: [place] (if you have MyChart) OR A paper copy in the mail If you have any lab test that is abnormal or we need to change your treatment, we will call you to review the results.  Testing/Procedures: Your physician has requested that you have an echocardiogram. Echocardiography is a painless test that uses sound waves to create images of your heart. It provides your doctor with information about the size and shape of your heart and how well your heart's chambers and valves are working. This procedure takes approximately one hour. There are no restrictions for this procedure. Please do NOT wear cologne, perfume, aftershave, or lotions (deodorant is allowed). Please arrive 15 minutes prior to your appointment time.  Please schedule for 1 year   Follow-Up: At [facility], you and your health needs are our priority.  As part of our continuing mission to provide you with exceptional heart care, we have created designated Provider Care Teams.  These Care Teams include your primary Cardiologist (physician) and Advanced Practice Providers (APPs -  Physician Assistants and Nurse Practitioners) who all work together to provide you with the care you need, when you need it.  Your next appointment:   1 year(s)  Provider:   Minus Breeding, MD     Other Instructions

## 2022-09-30 DIAGNOSIS — H2512 Age-related nuclear cataract, left eye: Secondary | ICD-10-CM | POA: Diagnosis not present

## 2022-10-10 DIAGNOSIS — H2511 Age-related nuclear cataract, right eye: Secondary | ICD-10-CM | POA: Diagnosis not present

## 2022-10-14 DIAGNOSIS — H2511 Age-related nuclear cataract, right eye: Secondary | ICD-10-CM | POA: Diagnosis not present

## 2022-11-12 ENCOUNTER — Telehealth: Payer: Self-pay | Admitting: Family Medicine

## 2022-11-12 NOTE — Telephone Encounter (Signed)
Type of form received: FINANCIAL PPW   Additional comments:   Received [id number]   Form should be Faxed to:  Form should be mailed to:    Is patient requesting call for pickup:YES    Form placed:  PROVIDERS BOX   Attach charge sheet. YES   Individual made aware of 3-5 business day turn around (Y/N)? YES

## 2022-11-16 NOTE — Telephone Encounter (Signed)
Form in your to sign folder

## 2022-11-16 NOTE — Telephone Encounter (Signed)
Great job on [employer] place back on your desk tomorrow

## 2022-11-17 NOTE — Telephone Encounter (Signed)
Forms placed up front, called and made pt aware.

## 2022-12-07 ENCOUNTER — Encounter: Payer: Self-pay | Admitting: Cardiology

## 2022-12-07 MED ORDER — EZETIMIBE 10 MG PO TABS: 10.0000 mg | ORAL_TABLET | Freq: Every day | ORAL | 3 refills | Status: DC

## 2022-12-28 DIAGNOSIS — E785 Hyperlipidemia, unspecified: Secondary | ICD-10-CM | POA: Diagnosis not present

## 2022-12-28 DIAGNOSIS — I2510 Atherosclerotic heart disease of native coronary artery without angina pectoris: Secondary | ICD-10-CM | POA: Diagnosis not present

## 2022-12-29 LAB — NMR, LIPOPROFILE
Cholesterol, Total: 180 mg/dL (ref 100–199)
HDL Particle Number: 36.5 umol/L (ref >=30.5)
HDL-C: 103 mg/dL (ref >39)
LDL Particle Number: 592 nmol/L (ref <1000)
LDL Size: 21.1 nm (ref >20.5)
LDL-C (NIH Calc): 65 mg/dL (ref 0–99)
LP-IR Score: <25 (ref <=45)
Small LDL Particle Number: <90 nmol/L (ref <=527)
Triglycerides: 62 mg/dL (ref 0–149)

## 2023-04-11 ENCOUNTER — Other Ambulatory Visit: Payer: Self-pay | Admitting: Family Medicine

## 2023-04-15 DIAGNOSIS — Z23 Encounter for immunization: Secondary | ICD-10-CM | POA: Diagnosis not present

## 2023-05-19 ENCOUNTER — Ambulatory Visit: Payer: Medicare Other | Admitting: Family Medicine

## 2023-05-23 DIAGNOSIS — H35372 Puckering of macula, left eye: Secondary | ICD-10-CM | POA: Diagnosis not present

## 2023-05-23 DIAGNOSIS — Z961 Presence of intraocular lens: Secondary | ICD-10-CM | POA: Diagnosis not present

## 2023-07-14 ENCOUNTER — Ambulatory Visit: Payer: Medicare Other

## 2023-07-14 VITALS — Wt 165.0 lb

## 2023-07-14 DIAGNOSIS — Z0000 Encounter for general adult medical examination without abnormal findings: Secondary | ICD-10-CM | POA: Diagnosis not present

## 2023-07-14 NOTE — Progress Notes (Signed)
Subjective:   Nicholas Perry is a 72 y.o. male who presents for Medicare Annual/Subsequent preventive examination.  Visit Complete: Virtual I connected with  Nicholas Perry on 07/14/23 by a audio enabled telemedicine application and verified that I am speaking with the correct Perry using two identifiers.  Patient Location: Home  Provider Location: Home Office  I discussed the limitations of evaluation and management by telemedicine. The patient expressed understanding and agreed to proceed.  Vital Signs: Because this visit was a virtual/telehealth visit, some criteria may be missing or patient reported. Any vitals not documented were not able to be obtained and vitals that have been documented are patient reported.  Patient Medicare AWV questionnaire was completed by the patient on 07/10/23; I have confirmed that all information answered by patient is correct and no changes since this date.  Cardiac Risk Factors include: advanced age (>30men, >8 women);dyslipidemia;male gender;hypertension     Objective:    Today's Vitals   07/14/23 1111  Weight: 165 lb (74.8 kg)   Body mass index is 21.77 kg/m.     07/14/2023   11:15 AM 07/08/2022   10:38 AM 09/08/2018    9:02 AM 09/07/2018    9:19 AM 06/27/2015    6:24 AM 06/24/2015   10:18 AM 04/29/2015   11:04 AM  Advanced Directives  Does Patient Have a Medical Advance Directive? Yes Yes No No Yes;No Yes No  Type of Diplomatic Services operational officer;Living will Healthcare Power of [place];Living will    Healthcare Power of [place];Living will   Does patient want to make changes to medical advance directive?    No - Patient declined     Copy of Healthcare Power of Attorney in Chart? No - copy requested No - copy requested    Yes   Would patient like information on creating a medical advance directive?   No - Patient declined        Current Medications (verified) Outpatient Encounter Medications as of 07/14/2023  Medication Sig    aspirin EC 81 MG tablet Take 1 tablet (81 mg total) by mouth daily. Swallow whole.   ezetimibe (ZETIA) 10 MG tablet Take 1 tablet (10 mg total) by mouth daily.   hydrochlorothiazide (HYDRODIURIL) 12.5 MG tablet Take 1 tablet (12.5 mg total) by mouth daily. In the morning   losartan (COZAAR) 100 MG tablet Take 1 tablet (100 mg total) by mouth daily.   rosuvastatin (CRESTOR) 20 MG tablet Take 1 tablet (20 mg total) by mouth daily.   [DISCONTINUED] rivaroxaban (XARELTO) 20 MG TABS tablet Take 1 tablet (20 mg total) by mouth daily with supper.   No facility-administered encounter medications on file as of 07/14/2023.    Allergies (verified) Cardura [doxazosin mesylate] and Simvastatin   History: Past Medical History:  Diagnosis Date   GERD (gastroesophageal reflux disease)    Hyperlipidemia    Hypertension    Paroxysmal atrial fibrillation (HCC)    Partial tear of rotator cuff 06/27/2015   Post concussive encephalopathy 12/26/2013   Seizure after head injury (HCC) 12/14/2013   Shoulder pain, left    Past Surgical History:  Procedure Laterality Date   ATRIAL FIBRILLATION ABLATION N/A 09/07/2018   Procedure: ATRIAL FIBRILLATION ABLATION;  Surgeon: Hillis Range, MD;  Location: [facility];  Service: Cardiovascular;  Laterality: N/A;   BICEPS TENDON REPAIR     COLONOSCOPY     knee scopes Bilateral    NASAL SEPTUM SURGERY     right inguinal hernia  child   SHOULDER ACROMIOPLASTY Left 06/27/2015   Procedure: SHOULDER ACROMIOPLASTY DEBRIDE LABRUM;  Surgeon: Teryl Lucy, MD;  Location: [facility];  Service: Orthopedics;  Laterality: Left;   SHOULDER ARTHROSCOPY WITH ROTATOR CUFF REPAIR Left 06/27/2015   Procedure: SHOULDER ARTHROSCOPY WITH ROTATOR CUFF REPAIR;  Surgeon: Teryl Lucy, MD;  Location: [facility];  Service: Orthopedics;  Laterality: Left;   TONSILECTOMY/ADENOIDECTOMY WITH MYRINGOTOMY     child   UPPER GASTROINTESTINAL ENDOSCOPY      Family History  Problem Relation Age of Onset   Cancer - Other Father        renal cell metastatic   CAD Father 74   Hypertension Brother        smoker   CAD Brother 44       stent, smoker   Colon cancer Neg Hx    Colon polyps Neg Hx    Esophageal cancer Neg Hx    Stomach cancer Neg Hx    Rectal cancer Neg Hx    Social History   Socioeconomic History   Marital status: Married    Spouse name: Not on file   Number of children: 3   Years of education: MD   Highest education level: Not on file  Occupational History   Occupation: N/A  Tobacco Use   Smoking status: Never   Smokeless tobacco: Never  Vaping Use   Vaping status: Never Used  Substance and Sexual Activity   Alcohol use: Yes    Alcohol/week: 7.0 - 14.0 standard drinks of alcohol    Types: 7 - 14 Standard drinks or equivalent per week    Comment: 1/2 glass a week   Drug use: No   Sexual activity: Not on file  Other Topics Concern   Not on file  Social History Narrative   Married (2nd marriage in 2010). 3 children from first marriage. 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   Patient drinks about 1 cup of caffeine daily and occasionally has a soda.    Social Determinants of Health   Financial Resource Strain: Low Risk  (07/10/2023)   Overall Financial Resource Strain (CARDIA)    Difficulty of Paying Living Expenses: Not hard at all  Food Insecurity: No Food Insecurity (07/10/2023)   Hunger Vital Sign    Worried About Running Out of Food in the Last Year: Never true    Ran Out of Food in the Last  Year: Never true  Transportation Needs: No Transportation Needs (07/10/2023)   PRAPARE - Administrator, Civil Service (Medical): No    Lack of Transportation (Non-Medical): No  Physical Activity: Sufficiently Active (07/10/2023)   Exercise Vital Sign    Days of Exercise per Week: 7 days    Minutes of Exercise per Session: 50 min  Stress: No Stress Concern Present (07/10/2023)   [employer] of Occupational Health - Occupational Stress Questionnaire    Feeling of Stress : Not at all  Social Connections: Moderately Isolated (07/10/2023)  Social Advertising account executive [NHANES]    Frequency of Communication with Friends and Family: Twice a week    Frequency of Social Gatherings with Friends and Family: Twice a week    Attends Religious Services: Never    Database administrator or Organizations: No    Attends Engineer, structural: Never    Marital Status: Married    Tobacco Counseling Counseling given: Not Answered   Clinical Intake:  Pre-visit preparation completed: Yes  Pain : No/denies pain     BMI - recorded: 21.77 Nutritional Status: BMI of 19-24  Normal Nutritional Risks: None Diabetes: No  How often do you need to have someone help you when you read instructions, pamphlets, or other written materials from your doctor or pharmacy?: 1 - Never  Interpreter Needed?: No  Information entered by :: Lanier Ensign, LPN   Activities of Daily Living    07/10/2023    2:51 PM  In your present state of health, do you have any difficulty performing the following activities:  Hearing? 0  Vision? 0  Difficulty concentrating or making decisions? 0  Walking or climbing stairs? 0  Dressing or bathing? 0  Doing errands, shopping? 0  Preparing Food and eating ? N  Using the Toilet? N  In the past six months, have you accidently leaked urine? N  Do you have problems with loss of bowel control? N  Managing your Medications? N  Managing your  Finances? N  Housekeeping or managing your Housekeeping? N    Patient Care Team: Shelva Majestic, MD as PCP - General (Family Medicine) Rollene Rotunda, MD as PCP - Cardiology (Cardiology) Gladstone Pih, PhD (Inactive) as Consulting Physician (Psychology) Rollene Rotunda, MD as Consulting Physician (Cardiology)  Indicate any recent Medical Services you may have received from other than [facility] providers in the past year (date may be approximate).     Assessment:   This is a routine wellness examination for [employer].  Hearing/Vision screen Hearing Screening - Comments:: Pt denies any hearing issues  Vision Screening - Comments:: Pt follows up with Dr Dione Booze for annual eye exams    Goals Addressed             This Visit's Progress    Patient Stated       Maintain health and activity        Depression Screen    07/14/2023   11:16 AM 07/08/2022   10:37 AM 05/17/2022    8:20 AM 05/13/2021    9:14 AM 05/12/2020    9:40 AM 04/05/2019    1:57 PM 05/04/2018    9:39 AM  PHQ 2/9 Scores  PHQ - 2 Score 0 0 0 0 0 0 0  PHQ- 9 Score  0 0        Fall Risk    07/10/2023    2:51 PM 07/08/2022   10:39 AM 07/06/2022    6:59 AM 05/17/2022    8:20 AM 05/13/2021    9:14 AM  Fall Risk   Falls in the past year? 0 0 0 0 0  Number falls in past yr: 0 0 0 0 0  Injury with Fall? 0 0 0 0 0  Risk for fall due to : No Fall Risks Impaired vision;Impaired balance/gait  No Fall Risks No Fall Risks  Risk for fall due to: Comment  balance due to injury in past     Follow up Falls prevention discussed Falls prevention discussed  Falls  evaluation completed Falls evaluation completed    MEDICARE RISK AT HOME: Medicare Risk at Home Any stairs in or around the home?: Yes If so, are there any without handrails?: No Home free of loose throw rugs in walkways, pet beds, electrical cords, etc?: Yes Adequate lighting in your home to reduce risk of falls?: Yes Life alert?: No Use of a cane, walker or w/c?:  No Grab bars in the bathroom?: No Shower chair or bench in shower?: Yes Elevated toilet seat or a handicapped toilet?: Yes  TIMED UP AND GO:  Was the test performed?  No    Cognitive Function:        07/14/2023   11:18 AM 07/08/2022   10:41 AM  6CIT Screen  What Year? 0 points 0 points  What month? 0 points 0 points  What time? 0 points 0 points  Count back from 20 0 points 0 points  Months in reverse 0 points 0 points  Repeat phrase 0 points 0 points  Total Score 0 points 0 points    Immunizations Immunization History  Administered Date(s) Administered   Fluad Quad(high Dose 65+) 04/18/2019, 05/12/2020, 04/23/2022   Influenza Split 04/15/2016   Influenza Whole 04/11/2009   Influenza, High Dose Seasonal PF 05/04/2018, 04/15/2023   Influenza,inj,Quad PF,6+ Mos 04/25/2017   Influenza-Unspecified 04/23/2014, 04/14/2021   PFIZER Comirnaty(Gray Top)Covid-19 Tri-Sucrose Vaccine 04/30/2022   PFIZER(Purple Top)SARS-COV-2 Vaccination 08/24/2019, 09/14/2019, 05/06/2020, 11/06/2020   Pfizer(Comirnaty)Fall Seasonal Vaccine 12 years and older 04/15/2023   Pneumococcal Conjugate-13 05/04/2018   Pneumococcal Polysaccharide-23 05/07/2019   Td 06/09/2009   Tdap 12/07/2013   Zoster Recombinant(Shingrix) 05/31/2018, 09/27/2018   Zoster, Live 07/18/2014     TDAP status: Up to date  Flu Vaccine status: Up to date  Pneumococcal vaccine status: Up to date  Covid-19 vaccine status: Information provided on how to obtain vaccines.   Qualifies for Shingles Vaccine? Yes   Zostavax completed Yes   Shingrix Completed?: Yes  Screening Tests Health Maintenance  Topic Date Due   COVID-19 Vaccine (7 - 2023-24 season) 06/10/2023   DTaP/Tdap/Td (3 - Td or Tdap) 12/08/2023   Medicare Annual Wellness (AWV)  07/13/2024   Colonoscopy  06/18/2026   Pneumonia Vaccine 5+ Years old  Completed   INFLUENZA VACCINE  Completed   Hepatitis C Screening  Completed   Zoster Vaccines- Shingrix   Completed   HPV VACCINES  Aged Out    Health Maintenance  Health Maintenance Due  Topic Date Due   COVID-19 Vaccine (7 - 2023-24 season) 06/10/2023    Colorectal cancer screening: Type of screening: Colonoscopy. Completed 06/19/19. Repeat every 7 years   Additional Screening:  Hepatitis C Screening: Completed 06/02/15  Vision Screening: Recommended annual ophthalmology exams for early detection of glaucoma and other disorders of the eye. Is the patient up to date with their annual eye exam?  Yes  Who is the provider or what is the name of the office in which the patient attends annual eye exams? Dr Dione Booze  If pt is not established with a provider, would they like to be referred to a provider to establish care? No .   Dental Screening: Recommended annual dental exams for proper oral hygiene   Community Resource Referral / Chronic Care Management: CRR required this visit?  No   CCM required this visit?  No     Plan:     I have personally reviewed and noted the following in the patient's chart:   Medical and social history Use  of alcohol, tobacco or illicit drugs  Current medications and supplements including opioid prescriptions. Patient is not currently taking opioid prescriptions. Functional ability and status Nutritional status Physical activity Advanced directives List of other physicians Hospitalizations, surgeries, and ER visits in previous 12 months Vitals Screenings to include cognitive, depression, and falls Referrals and appointments  In addition, I have reviewed and discussed with patient certain preventive protocols, quality metrics, and best practice recommendations. A written personalized care plan for preventive services as well as general preventive health recommendations were provided to patient.     Marzella Schlein, LPN   04/16/1190   After Visit Summary: (MyChart) Due to this being a telephonic visit, the after visit summary with patients  personalized plan was offered to patient via MyChart   Nurse Notes: none

## 2023-07-14 NOTE — Patient Instructions (Signed)
Nicholas Perry , Thank you for taking time to come for your Medicare Wellness Visit. I appreciate your ongoing commitment to your health goals. Please review the following plan we discussed and let me know if I can assist you in the future.   Referrals/Orders/Follow-Ups/Clinician Recommendations: maintain health and activity   This is a list of the screening recommended for you and due dates:  Health Maintenance  Topic Date Due   COVID-19 Vaccine (7 - 2023-24 season) 06/10/2023   DTaP/Tdap/Td vaccine (3 - Td or Tdap) 12/08/2023   Medicare Annual Wellness Visit  07/13/2024   Colon Cancer Screening  06/18/2026   Pneumonia Vaccine  Completed   Flu Shot  Completed   Hepatitis C Screening  Completed   Zoster (Shingles) Vaccine  Completed   HPV Vaccine  Aged Out    Advanced directives: (Copy Requested) Please bring a copy of your health care power of attorney and living will to the office to be added to your chart at your convenience.  Next Medicare Annual Wellness Visit scheduled for next year: Yes

## 2023-08-19 DIAGNOSIS — D225 Melanocytic nevi of trunk: Secondary | ICD-10-CM | POA: Diagnosis not present

## 2023-08-19 DIAGNOSIS — L570 Actinic keratosis: Secondary | ICD-10-CM | POA: Diagnosis not present

## 2023-08-19 DIAGNOSIS — L821 Other seborrheic keratosis: Secondary | ICD-10-CM | POA: Diagnosis not present

## 2023-08-19 DIAGNOSIS — L814 Other melanin hyperpigmentation: Secondary | ICD-10-CM | POA: Diagnosis not present

## 2023-08-19 DIAGNOSIS — D2261 Melanocytic nevi of right upper limb, including shoulder: Secondary | ICD-10-CM | POA: Diagnosis not present

## 2023-08-19 DIAGNOSIS — D2262 Melanocytic nevi of left upper limb, including shoulder: Secondary | ICD-10-CM | POA: Diagnosis not present

## 2023-09-07 ENCOUNTER — Other Ambulatory Visit: Payer: Self-pay | Admitting: Cardiology

## 2023-09-07 DIAGNOSIS — E785 Hyperlipidemia, unspecified: Secondary | ICD-10-CM

## 2023-09-09 ENCOUNTER — Encounter: Payer: Self-pay | Admitting: Family Medicine

## 2023-09-09 ENCOUNTER — Ambulatory Visit (INDEPENDENT_AMBULATORY_CARE_PROVIDER_SITE_OTHER): Payer: Medicare Other | Admitting: Family Medicine

## 2023-09-09 VITALS — BP 110/60 | HR 47 | Temp 97.1°F | Ht 73.0 in | Wt 167.2 lb

## 2023-09-09 DIAGNOSIS — I480 Paroxysmal atrial fibrillation: Secondary | ICD-10-CM | POA: Diagnosis not present

## 2023-09-09 DIAGNOSIS — R931 Abnormal findings on diagnostic imaging of heart and coronary circulation: Secondary | ICD-10-CM

## 2023-09-09 DIAGNOSIS — Z125 Encounter for screening for malignant neoplasm of prostate: Secondary | ICD-10-CM | POA: Diagnosis not present

## 2023-09-09 DIAGNOSIS — E785 Hyperlipidemia, unspecified: Secondary | ICD-10-CM

## 2023-09-09 DIAGNOSIS — I10 Essential (primary) hypertension: Secondary | ICD-10-CM | POA: Diagnosis not present

## 2023-09-09 DIAGNOSIS — R739 Hyperglycemia, unspecified: Secondary | ICD-10-CM | POA: Diagnosis not present

## 2023-09-09 DIAGNOSIS — Z131 Encounter for screening for diabetes mellitus: Secondary | ICD-10-CM

## 2023-09-09 MED ORDER — ROSUVASTATIN CALCIUM 20 MG PO TABS: 20.0000 mg | ORAL_TABLET | Freq: Every day | ORAL | 3 refills | Status: DC

## 2023-09-09 MED ORDER — HYDROCHLOROTHIAZIDE 12.5 MG PO TABS: 12.5000 mg | ORAL_TABLET | Freq: Every day | ORAL | 3 refills | Status: DC

## 2023-09-09 MED ORDER — EZETIMIBE 10 MG PO TABS: 10.0000 mg | ORAL_TABLET | Freq: Every day | ORAL | 3 refills | Status: DC

## 2023-09-09 MED ORDER — SULFAMETHOXAZOLE-TRIMETHOPRIM 800-160 MG PO TABS
1.0000 | ORAL_TABLET | Freq: Two times a day (BID) | ORAL | 0 refills | Status: AC
Start: 2023-09-09 — End: 2023-09-19

## 2023-09-09 MED ORDER — LOSARTAN POTASSIUM 100 MG PO TABS: 100.0000 mg | ORAL_TABLET | Freq: Every day | ORAL | 3 refills | Status: DC

## 2023-09-09 NOTE — Addendum Note (Signed)
Addended by: Shelva Majestic on: 09/09/2023 05:06 PM   Modules accepted: Level of Service

## 2023-09-09 NOTE — Progress Notes (Signed)
Phone [phone] In person visit   Subjective:   Nicholas Perry is a 73 y.o. year old very pleasant male patient who presents for/with See problem oriented charting Chief Complaint  Patient presents with   Annual Exam    Not fasting. ROS given.   Hypertension   epididimytis    Past Medical History-  Patient Active Problem List   Diagnosis Date Noted   Elevated coronary artery calcium score 04/28/2017    Priority: High   Post concussive encephalopathy 12/26/2013    Priority: High   History of seizure- after head injury/electrocution 12/14/2013    Priority: High   Paroxysmal atrial fibrillation (HCC) 09/22/2009    Priority: High   Hyperglycemia 05/17/2022    Priority: Medium    History of adenomatous polyp of colon 06/26/2019    Priority: Medium    BPH (benign prostatic hyperplasia) 07/18/2014    Priority: Medium    Hyperlipidemia 01/02/2008    Priority: Medium    Essential hypertension 01/02/2008    Priority: Medium    Other fatigue 07/24/2018    Priority: Low   Coronary artery disease involving native coronary artery of native heart without angina pectoris 08/10/2020    Priority: 1.   Lumbar spinal stenosis 05/14/2019    Priority: 1.   Rheumatic aortic valve insufficiency 09/18/2022   MVP (mitral valve prolapse) 08/10/2020   Nonallopathic lesion of lumbosacral region 07/31/2019   Nonallopathic lesion of sacral region 07/31/2019   Nonallopathic lesion of thoracic region 07/31/2019   Nonrheumatic aortic valve insufficiency 07/07/2019   Partial tear of rotator cuff 06/27/2015    Medications- reviewed and updated Current Outpatient Medications  Medication Sig Dispense Refill   aspirin EC 81 MG tablet Take 1 tablet (81 mg total) by mouth daily. Swallow whole. 30 tablet 11   ezetimibe (ZETIA) 10 MG tablet TAKE 1 TABLET BY MOUTH EVERY DAY 90 tablet 0   hydrochlorothiazide (HYDRODIURIL) 12.5 MG tablet Take 1 tablet (12.5 mg total) by mouth daily. In the morning 90 tablet  3   losartan (COZAAR) 100 MG tablet TAKE 1 TABLET BY MOUTH EVERY DAY 90 tablet 0   rosuvastatin (CRESTOR) 20 MG tablet TAKE 1 TABLET BY MOUTH EVERY DAY 90 tablet 0   No current facility-administered medications for this visit.     Objective:  BP 110/60   Pulse (!) 47   Temp (!) 97.1 F (36.2 C)   Ht 6\' 1"  (1.854 m)   Wt 167 lb 3.2 oz (75.8 kg)   SpO2 98%   BMI 22.06 kg/m  Gen: NAD, resting comfortably CV: irregularly irregular and bradycardic Lungs: CTAB no crackles, wheeze, rhonchi Abdomen: soft/nontender/nondistended/normal bowel sounds. No rebound or guarding.  Ext: no edema Skin: warm, dry     Assessment and Plan   # Atrial fibrillation s/p ablation without recurrence.  Still with PVCs. Sees Dr. Antoine Poche  S: Rate controlled with no rx.  cardizem only as needed in past- hasn't needed  Anticoagulated with no rx- on asa only post ablation A/P: a fib doing reasonably well after ablation- still gets PVC's. Not on anticoagulation per cardiology.    #MVP- stable murmurand echocardiograms followed by cardiology-most recent echocardiogram January 2024 with aortic insufficiency mild to moderate and mild mitral regurgitation with ejection fraction well-preserved- coming up in February for repeat  #Jittery episodes- gets some lows on home dexcom- has adjusted his diet- apparently oatmeal was a problem until he switched to steel cut oats  #hyperlipidemia S: Medication: zetia 10 mg, rosuvastatin  20 mg ( a1c concern and abdomen pain at times- wants to avoid increasing dose). Prefer LDL under 70 due to coronary calcium score of  204- 65% for age in 2020 -lipoprotein a not elevated Lab Results  Component Value Date   CHOL 200 05/17/2022   HDL 108.60 05/17/2022   LDLCALC 79 05/17/2022   LDLDIRECT 151.1 08/12/2009   TRIG 62.0 05/17/2022   CHOLHDL 2 05/17/2022   A/P: hopefully improved- update lipids- continue current medications for now   #hypertension S: medication: losartan 100  mg (most of the time but sometimes takes just 50 or 50 in am and 50 in pm) and hydrochlorothiazide 12.5 mg daily. Mild orthostatic issues in the summer.  BP Readings from Last 3 Encounters:  09/09/23 110/60  09/20/22 124/70  05/17/22 116/60  A/P: reasonable control and he is working around orthostatic issues by doing 50 in am and 50 in pm- can hold dose if needed- pressures have looked excellent.   #postconcussive encephalopathy. 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 Continue without meds for now   # Health maintenance 1.  Healthy eating/regular exercise-weight within 2 lbs of last year- still in his typical healthy range around 165. Walks hour every morning with dog and does weights and some aerobics MWF and sometimes Sunday.  -prior vasectomy 1987 and developed large knot - mechanical ependymitis diagnosed- hot baths and pull back on physical activity. Came back 6 months later and stayed with him- required antibiotic. Did well for 10 years and intermittently would have issues and require antibiotics- 3 courses over 35 years. Recurrent knot and very tender and trying to pull back with activity and used warm compresses without relief.  2.  Immunizations-fully up-to-date. Had COVID 2 weeks after shot.  Immunization History  Administered Date(s) Administered   Fluad Quad(high Dose 65+) 04/18/2019, 05/12/2020, 04/23/2022   Influenza Split 04/15/2016   Influenza Whole 04/11/2009   Influenza, High Dose Seasonal PF 05/04/2018, 04/15/2023   Influenza,inj,Quad PF,6+ Mos 04/25/2017   Influenza-Unspecified 04/23/2014,  04/14/2021   PFIZER Comirnaty(Gray Top)Covid-19 Tri-Sucrose Vaccine 04/30/2022   PFIZER(Purple Top)SARS-COV-2 Vaccination 08/24/2019, 09/14/2019, 05/06/2020, 11/06/2020   Pfizer(Comirnaty)Fall Seasonal Vaccine 12 years and older 04/15/2023   Pneumococcal Conjugate-13 05/04/2018   Pneumococcal Polysaccharide-23 05/07/2019   Td 06/09/2009   Tdap 12/07/2013   Zoster Recombinant(Shingrix) 05/31/2018, 09/27/2018   Zoster, Live 07/18/2014  3.  Prostate cancer screening-nocturia twice a night on hydrochlorothiazide . Low risk prior trend- update psa today  Lab Results  Component Value Date   PSA 0.64 05/17/2022   PSA 0.69 05/13/2021   PSA 0.58 05/12/2020  4.  Colon cancer screening colonoscopy 06/19/2019 with 7-year repeat planned  5.  Skin cancer screening-sees dermatology regularly Dr. Doreen Beam  Recommended follow up: Return in about 1 year (around 09/08/2024) for followup or sooner if needed.Schedule b4 you leave. Future Appointments  Date Time Provider Department Center  09/21/2023  8:30 AM [facility] ECHO 4 [id number]  10/26/2023 11:40 AM Rollene Rotunda, MD [facility] None  07/24/2024 11:20 AM [facility] ANNUAL WELLNESS VISIT 1 [facility]   Lab/Order associations:   ICD-10-CM   1. Paroxysmal atrial fibrillation (HCC)  I48.0     2. Essential hypertension  I10     3. Hyperlipidemia, unspecified hyperlipidemia type  E78.5     4. Elevated coronary artery calcium score  R93.1     5. Hyperglycemia  R73.9     6. Screening for diabetes mellitus  Z13.1     7. Screening for prostate cancer  Z12.5     8. Hyperlipidemia, unspecified  E78.5       No orders of the defined types were placed in this encounter.   Return precautions advised.  Tana Conch, MD

## 2023-09-09 NOTE — Patient Instructions (Addendum)
Schedule a lab visit at the check out desk or call back within 2 weeks. Return for future fasting labs meaning nothing but water after midnight please. Ok to take your medications with water.   I would gladly have your wife as a patient- just have her call  Recommended follow up: Return in about 1 year (around 09/08/2024) for followup or sooner if needed.Schedule b4 you leave.

## 2023-09-21 ENCOUNTER — Ambulatory Visit (HOSPITAL_COMMUNITY): Payer: Medicare Other | Attending: Internal Medicine

## 2023-09-21 DIAGNOSIS — I061 Rheumatic aortic insufficiency: Secondary | ICD-10-CM | POA: Diagnosis not present

## 2023-09-21 LAB — ECHOCARDIOGRAM COMPLETE
P 1/2 time: 476 ms
S' Lateral: 3.5 cm

## 2023-09-23 ENCOUNTER — Encounter: Payer: Self-pay | Admitting: Family Medicine

## 2023-09-23 ENCOUNTER — Other Ambulatory Visit: Payer: Medicare Other

## 2023-09-23 DIAGNOSIS — Z125 Encounter for screening for malignant neoplasm of prostate: Secondary | ICD-10-CM

## 2023-09-23 DIAGNOSIS — E785 Hyperlipidemia, unspecified: Secondary | ICD-10-CM | POA: Diagnosis not present

## 2023-09-23 DIAGNOSIS — R739 Hyperglycemia, unspecified: Secondary | ICD-10-CM

## 2023-09-23 DIAGNOSIS — Z131 Encounter for screening for diabetes mellitus: Secondary | ICD-10-CM

## 2023-09-23 DIAGNOSIS — R931 Abnormal findings on diagnostic imaging of heart and coronary circulation: Secondary | ICD-10-CM

## 2023-09-23 LAB — COMPREHENSIVE METABOLIC PANEL
ALT: 48 U/L (ref 0–53)
AST: 45 U/L — ABNORMAL HIGH (ref 0–37)
Albumin: 4.4 g/dL (ref 3.5–5.2)
Alkaline Phosphatase: 62 U/L (ref 39–117)
BUN: 23 mg/dL (ref 6–23)
CO2: 29 meq/L (ref 19–32)
Calcium: 9.4 mg/dL (ref 8.4–10.5)
Chloride: 103 meq/L (ref 96–112)
Creatinine, Ser: 1.21 mg/dL (ref 0.40–1.50)
GFR: 60.32 mL/min (ref >60.00)
Glucose, Bld: 89 mg/dL (ref 70–99)
Potassium: 4.5 meq/L (ref 3.5–5.1)
Sodium: 142 meq/L (ref 135–145)
Total Bilirubin: 0.9 mg/dL (ref 0.2–1.2)
Total Protein: 7 g/dL (ref 6.0–8.3)

## 2023-09-23 LAB — CBC WITH DIFFERENTIAL/PLATELET
Basophils Absolute: 0 10*3/uL (ref 0.0–0.1)
Basophils Relative: 0.6 % (ref 0.0–3.0)
Eosinophils Absolute: 0.1 10*3/uL (ref 0.0–0.7)
Eosinophils Relative: 1.7 % (ref 0.0–5.0)
HCT: 44 % (ref 39.0–52.0)
Hemoglobin: 14.4 g/dL (ref 13.0–17.0)
Lymphocytes Relative: 14.5 % (ref 12.0–46.0)
Lymphs Abs: 0.7 10*3/uL (ref 0.7–4.0)
MCHC: 32.8 g/dL (ref 30.0–36.0)
MCV: 97.3 fL (ref 78.0–100.0)
Monocytes Absolute: 0.6 10*3/uL (ref 0.1–1.0)
Monocytes Relative: 12.1 % — ABNORMAL HIGH (ref 3.0–12.0)
Neutro Abs: 3.6 10*3/uL (ref 1.4–7.7)
Neutrophils Relative %: 71.1 % (ref 43.0–77.0)
Platelets: 178 10*3/uL (ref 150.0–400.0)
RBC: 4.52 Mil/uL (ref 4.22–5.81)
RDW: 13.8 % (ref 11.5–15.5)
WBC: 5 10*3/uL (ref 4.0–10.5)

## 2023-09-23 LAB — LIPID PANEL
Cholesterol: 181 mg/dL (ref 0–200)
HDL: 104.5 mg/dL (ref >39.00)
LDL Cholesterol: 64 mg/dL (ref 0–99)
NonHDL: 76.62
Total CHOL/HDL Ratio: 2
Triglycerides: 64 mg/dL (ref 0.0–149.0)
VLDL: 12.8 mg/dL (ref 0.0–40.0)

## 2023-09-23 LAB — PSA, MEDICARE: PSA: 1.08 ng/mL (ref 0.10–4.00)

## 2023-09-23 LAB — HEMOGLOBIN A1C: Hgb A1c MFr Bld: 6.1 % (ref 4.6–6.5)

## 2023-09-26 NOTE — Telephone Encounter (Signed)
 Please see pt response.

## 2023-10-14 ENCOUNTER — Ambulatory Visit: Payer: Medicare Other | Admitting: Cardiology
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
## 2023-10-26 ENCOUNTER — Encounter: Payer: Self-pay | Admitting: Cardiology

## 2023-10-26 ENCOUNTER — Ambulatory Visit: Payer: Medicare Other | Attending: Cardiology | Admitting: Cardiology

## 2023-10-26 VITALS — BP 117/64 | HR 58 | Ht 73.0 in | Wt 162.8 lb

## 2023-10-26 DIAGNOSIS — R931 Abnormal findings on diagnostic imaging of heart and coronary circulation: Secondary | ICD-10-CM | POA: Diagnosis not present

## 2023-10-26 DIAGNOSIS — I061 Rheumatic aortic insufficiency: Secondary | ICD-10-CM | POA: Diagnosis not present

## 2023-10-26 DIAGNOSIS — I2510 Atherosclerotic heart disease of native coronary artery without angina pectoris: Secondary | ICD-10-CM

## 2023-10-26 DIAGNOSIS — I10 Essential (primary) hypertension: Secondary | ICD-10-CM

## 2023-10-26 DIAGNOSIS — E785 Hyperlipidemia, unspecified: Secondary | ICD-10-CM

## 2023-10-26 MED ORDER — REPATHA SURECLICK 140 MG/ML ~~LOC~~ SOAJ: 140.0000 mg | SUBCUTANEOUS | 3 refills | Status: DC

## 2023-10-26 NOTE — Patient Instructions (Signed)
 Medication Instructions:  Stop Crestor, statin added to allergy list. Start Repatha as instructed  *If you need a refill on your cardiac medications before your next appointment, please call your pharmacy*   Lab Work: Fasting Lipid profile in 3 months. No appointment needed.  If you have labs (blood work) drawn today and your tests are completely normal, you will receive your results only by: MyChart Message (if you have MyChart) OR A paper copy in the mail If you have any lab test that is abnormal or we need to change your treatment, we will call you to review the results.   Testing/Procedures: Your physician has requested that you have a cardiac MRI. Due in February of 2026. Cardiac MRI uses a computer to create images of your heart as its beating, producing both still and moving pictures of your heart and major blood vessels. For further information please visit [email or web address]. Please follow the instruction sheet given to you today for more information.    Follow-Up: At [facility], you and your health needs are our priority.  As part of our continuing mission to provide you with exceptional heart care, we have created designated Provider Care Teams.  These Care Teams include your primary Cardiologist (physician) and Advanced Practice Providers (APPs -  Physician Assistants and Nurse Practitioners) who all work together to provide you with the care you need, when you need it.  We recommend signing up for the patient portal called "MyChart".  Sign up information is provided on this After Visit Summary.  MyChart is used to connect with patients for Virtual Visits (Telemedicine).  Patients are able to view lab/test results, encounter notes, upcoming appointments, etc.  Non-urgent messages can be sent to your provider as well.   To learn more about what you can do with MyChart, go to [email or web address].    Your next appointment:   1 year(s)  Provider:   Rollene Rotunda, MD      Other Instructions

## 2023-10-27 ENCOUNTER — Other Ambulatory Visit (HOSPITAL_COMMUNITY): Payer: Self-pay

## 2023-10-27 ENCOUNTER — Encounter: Payer: Self-pay | Admitting: Cardiology

## 2023-10-27 ENCOUNTER — Telehealth: Payer: Self-pay | Admitting: Pharmacy Technician

## 2023-10-27 DIAGNOSIS — E785 Hyperlipidemia, unspecified: Secondary | ICD-10-CM

## 2023-10-27 DIAGNOSIS — I2510 Atherosclerotic heart disease of native coronary artery without angina pectoris: Secondary | ICD-10-CM

## 2023-10-27 NOTE — Telephone Encounter (Signed)
 Pharmacy Patient Advocate Encounter   Received notification from Patient Advice Request messages that prior authorization for Repatha is required/requested.   Insurance verification completed.   The patient is insured through [facility] .   Per test claim: PA required; PA submitted to above mentioned insurance via CoverMyMeds Key/confirmation #/EOC [id number] Status is pending

## 2023-10-28 ENCOUNTER — Other Ambulatory Visit (HOSPITAL_COMMUNITY): Payer: Self-pay

## 2023-10-28 ENCOUNTER — Telehealth: Payer: Self-pay | Admitting: Pharmacy Technician

## 2023-10-28 DIAGNOSIS — I2510 Atherosclerotic heart disease of native coronary artery without angina pectoris: Secondary | ICD-10-CM

## 2023-10-28 DIAGNOSIS — E785 Hyperlipidemia, unspecified: Secondary | ICD-10-CM

## 2023-10-28 NOTE — Telephone Encounter (Signed)
 Pharmacy Patient Advocate Encounter  Received notification from [facility] that Prior Authorization for praluent has been APPROVED from 10/13/23 to further notice. Ran test claim, Copay is $466.28. This test claim was processed through [facility]- copay amounts may vary at other pharmacies due to pharmacy/plan contracts, or as the patient moves through the different stages of their insurance plan.  I got the patient a grant but then it came back saying need income verification. I spoke to the patient and he said he thinks he makes too much but will let me know. In the meantime he would [facility], he is aware of the cost if his healthwell grant can not be used.

## 2023-10-28 NOTE — Telephone Encounter (Signed)
 I got the patient a grant but then it came back saying need income verification. I spoke to the patient and he said he thinks he makes too much but will let me know. In the meantime he would like [facility], he is aware of the cost if his healthwell grant can not be used.

## 2023-10-28 NOTE — Telephone Encounter (Signed)
 This was changed to praluent -ins preference

## 2023-10-31 MED ORDER — PRALUENT 75 MG/ML ~~LOC~~ SOAJ: 75.0000 mg | SUBCUTANEOUS | 5 refills | Status: DC

## 2023-10-31 NOTE — Telephone Encounter (Signed)
 [facility]raluent sent to [facility]

## 2023-10-31 NOTE — Addendum Note (Signed)
 Addended by: Cheree Ditto on: 10/31/2023 12:09 PM   Modules accepted: Orders

## 2023-10-31 NOTE — Telephone Encounter (Signed)
 The patient just called and he said he thinks he will qualify. He is going to send in the tax documents to [employer]well. He is requesting the praluent prescription to now be sent to [facility] preferred [facility] and then I will give them the healthwell once he does the income. I couldn't edit the message I had already sent you in patient messages.

## 2023-11-01 MED ORDER — PRALUENT 75 MG/ML ~~LOC~~ SOAJ: 75.0000 mg | SUBCUTANEOUS | 5 refills | Status: DC

## 2023-12-03 ENCOUNTER — Other Ambulatory Visit: Payer: Self-pay | Admitting: Cardiology

## 2023-12-03 DIAGNOSIS — E785 Hyperlipidemia, unspecified: Secondary | ICD-10-CM

## 2024-02-14 ENCOUNTER — Other Ambulatory Visit: Payer: Self-pay | Admitting: *Deleted

## 2024-02-14 DIAGNOSIS — E785 Hyperlipidemia, unspecified: Secondary | ICD-10-CM | POA: Diagnosis not present

## 2024-02-15 ENCOUNTER — Ambulatory Visit: Payer: Self-pay | Admitting: Cardiology

## 2024-02-15 LAB — LIPID PANEL
Chol/HDL Ratio: 1.7 ratio (ref 0.0–5.0)
Cholesterol, Total: 196 mg/dL (ref 100–199)
HDL: 117 mg/dL (ref >39)
LDL Chol Calc (NIH): 67 mg/dL (ref 0–99)
Triglycerides: 63 mg/dL (ref 0–149)
VLDL Cholesterol Cal: 12 mg/dL (ref 5–40)

## 2024-04-04 ENCOUNTER — Other Ambulatory Visit: Payer: Self-pay | Admitting: Cardiology

## 2024-04-04 DIAGNOSIS — I2510 Atherosclerotic heart disease of native coronary artery without angina pectoris: Secondary | ICD-10-CM

## 2024-04-04 DIAGNOSIS — E785 Hyperlipidemia, unspecified: Secondary | ICD-10-CM

## 2024-05-02 DIAGNOSIS — Z23 Encounter for immunization: Secondary | ICD-10-CM | POA: Diagnosis not present

## 2024-05-28 DIAGNOSIS — H35372 Puckering of macula, left eye: Secondary | ICD-10-CM | POA: Diagnosis not present

## 2024-05-28 DIAGNOSIS — Z961 Presence of intraocular lens: Secondary | ICD-10-CM | POA: Diagnosis not present

## 2024-07-15 ENCOUNTER — Encounter: Payer: Self-pay | Admitting: Cardiology

## 2024-07-18 ENCOUNTER — Other Ambulatory Visit: Payer: Self-pay | Admitting: Medical Genetics

## 2024-07-18 ENCOUNTER — Telehealth: Payer: Self-pay | Admitting: Pharmacist

## 2024-07-18 DIAGNOSIS — E785 Hyperlipidemia, unspecified: Secondary | ICD-10-CM

## 2024-07-18 MED ORDER — ROSUVASTATIN CALCIUM 20 MG PO TABS: 20.0000 mg | ORAL_TABLET | Freq: Every day | ORAL | 3 refills | Status: AC

## 2024-07-18 NOTE — Telephone Encounter (Signed)
 Contacted pt per Dr Lavona regarding lipid management. No answer, lmom

## 2024-07-18 NOTE — Addendum Note (Signed)
 Addended by: DARRELL BRUCKNER on: 07/18/2024 02:44 PM   Modules accepted: Orders

## 2024-07-18 NOTE — Telephone Encounter (Signed)
 Patient called back to discuss lipid options. Is willing to restart lower dose statin and d/c Zetia . Discussed PCSK9i options plus inclisiran. He had contacted his plan and inclisiran should be less expensive for him.  Explained the process of enrollment and dosing schedule. Patient will come on Monday 12/15 to sign enrollment form so it can be faxed to service center.

## 2024-07-23 ENCOUNTER — Ambulatory Visit: Payer: Self-pay

## 2024-07-23 NOTE — Telephone Encounter (Signed)
 First attempt to reach patient at 6:16. Left message to call back at [phone]  Copied from CRM #[id number]. Topic: Clinical - Red Word Triage >> Jul 23, 2024  2:20 PM Alexandria E wrote: [facility] that prompted transfer to Nurse Triage: Patient tested positive for Covid this morning, low energy, throat pain, head pain, and congestion. >> Jul 23, 2024  2:31 PM Alexandria E wrote: Patient would appreciate a call back with further advice when a nurse is available.

## 2024-07-24 ENCOUNTER — Ambulatory Visit

## 2024-07-24 ENCOUNTER — Ambulatory Visit: Payer: Medicare Other

## 2024-07-24 VITALS — BP 118/62 | Ht 73.0 in | Wt 164.0 lb

## 2024-07-24 DIAGNOSIS — Z0000 Encounter for general adult medical examination without abnormal findings: Secondary | ICD-10-CM | POA: Diagnosis not present

## 2024-07-24 NOTE — Patient Instructions (Signed)
 Nicholas Perry,  Thank you for taking the time for your Medicare Wellness Visit. I appreciate your continued commitment to your health goals. Please review the care plan we discussed, and feel free to reach out if I can assist you further.  Please note that Annual Wellness Visits do not include a physical exam. Some assessments may be limited, especially if the visit was conducted virtually. If needed, we may recommend an in-person follow-up with your provider.  Ongoing Care Seeing your primary care provider every 3 to 6 months helps us  monitor your health and provide consistent, personalized care.   Referrals If a referral was made during today's visit and you haven't received any updates within two weeks, please contact the referred provider directly to check on the status.  Recommended Screenings:  Health Maintenance  Topic Date Due   DTaP/Tdap/Td vaccine (3 - Td or Tdap) 12/08/2023   COVID-19 Vaccine (7 - 2025-26 season) 10/30/2024   Medicare Annual Wellness Visit  07/24/2025   Colon Cancer Screening  06/18/2026   Pneumococcal Vaccine for age over 68  Completed   Flu Shot  Completed   Hepatitis C Screening  Completed   Zoster (Shingles) Vaccine  Completed   Meningitis B Vaccine  Aged Out       07/14/2023   11:15 AM  Advanced Directives  Does Patient Have a Medical Advance Directive? Yes  Type of Estate Agent of [place];Living will  Copy of Healthcare Power of Attorney in Chart? No - copy requested    Vision: Annual vision screenings are recommended for early detection of glaucoma, cataracts, and diabetic retinopathy. These exams can also reveal signs of chronic conditions such as diabetes and high blood pressure.  Dental: Annual dental screenings help detect early signs of oral cancer, gum disease, and other conditions linked to overall health, including heart disease and diabetes.  Please see the attached documents for additional preventive care  recommendations.

## 2024-07-24 NOTE — Progress Notes (Signed)
 Chief Complaint  Patient presents with   Medicare Wellness     Subjective:   Nicholas Perry is a 73 y.o. male who presents for a Medicare Annual Wellness Visit.  Visit info / Clinical Intake: Medicare Wellness Visit Type:: Subsequent Annual Wellness Visit Persons participating in visit and providing information:: patient Medicare Wellness Visit Mode:: Telephone If telephone:: video declined Since this visit was completed virtually, some vitals may be partially provided or unavailable. Missing vitals are due to the limitations of the virtual format.: Documented vitals are patient reported If Telephone or Video please confirm:: I connected with patient using audio/video enable telemedicine. I verified patient identity with two identifiers, discussed telehealth limitations, and patient agreed to proceed. Patient Location:: home Provider Location:: office Interpreter Needed?: No Pre-visit prep was completed: yes AWV questionnaire completed by patient prior to visit?: yes Date:: 07/23/24 Living arrangements:: (Patient-Rptd) lives with spouse/significant other Patient's Overall Health Status Rating: (Patient-Rptd) good Typical amount of pain: (Patient-Rptd) some Does pain affect daily life?: (Patient-Rptd) no Are you currently prescribed opioids?: no  Dietary Habits and Nutritional Risks How many meals a day?: (Patient-Rptd) 3 Eats fruit and vegetables daily?: (Patient-Rptd) yes Most meals are obtained by: (Patient-Rptd) preparing own meals Diabetic:: no  Functional Status Activities of Daily Living (to include ambulation/medication): (Patient-Rptd) Independent Ambulation: Independent with device- listed below Home Assistive Devices/Equipment: Eyeglasses Medication Administration: (Patient-Rptd) Independent Home Management (perform basic housework or laundry): (Patient-Rptd) Independent Manage your own finances?: (Patient-Rptd) yes Primary transportation is: (Patient-Rptd)  driving Concerns about vision?: no *vision screening is required for WTM* Concerns about hearing?: no  Fall Screening Falls in the past year?: (Patient-Rptd) 0 Number of falls in past year: 0 Was there an injury with Fall?: 0 Fall Risk Category Calculator: 0 Patient Fall Risk Level: Low Fall Risk  Fall Risk Patient at Risk for Falls Due to: Impaired balance/gait Fall risk Follow up: Falls prevention discussed  Home and Transportation Safety: All rugs have non-skid backing?: (Patient-Rptd) yes All stairs or steps have railings?: (Patient-Rptd) yes Grab bars in the bathtub or shower?: (!) (Patient-Rptd) no Have non-skid surface in bathtub or shower?: (Patient-Rptd) yes Good home lighting?: (Patient-Rptd) yes Regular seat belt use?: (Patient-Rptd) yes Hospital stays in the last year:: (Patient-Rptd) no  Cognitive Assessment Difficulty concentrating, remembering, or making decisions? : (Patient-Rptd) yes Will 6CIT or Mini Cog be Completed: yes What year is it?: 0 points What month is it?: 0 points Give patient an address phrase to remember (5 components): [address] [place]  About what time is it?: 0 points Count backwards from 20 to 1: 0 points Say the months of the year in reverse: 0 points Repeat the address phrase from earlier: 0 points 6 CIT Score: 0 points  Advance Directives (For Healthcare) Does Patient Have a Medical Advance Directive?: Yes Type of Advance Directive: Healthcare Power of Attorney Copy of Healthcare Power of Attorney in Chart?: No - copy requested  Reviewed/Updated  Reviewed/Updated: Reviewed All (Medical, Surgical, Family, Medications, Allergies, Care Teams, Patient Goals)    Allergies (verified) Cardura  [doxazosin  mesylate], Simvastatin, and Statins   Current Medications (verified) Outpatient Encounter Medications as of 07/24/2024  Medication Sig   Alirocumab  (PRALUENT ) 75 MG/ML SOAJ INJECT 1 ML (75 MG TOTAL) INTO THE SKIN EVERY 14  (FOURTEEN) DAYS.   aspirin EC 81 MG tablet Take 1 tablet (81 mg total) by mouth daily. Swallow whole.   hydrochlorothiazide  (HYDRODIURIL ) 12.5 MG tablet Take 1 tablet (12.5 mg total) by mouth daily. In the  morning   losartan  (COZAAR ) 100 MG tablet TAKE 1 TABLET BY MOUTH EVERY DAY   rosuvastatin  (CRESTOR ) 20 MG tablet Take 1 tablet (20 mg total) by mouth daily. (Patient not taking: Reported on 07/24/2024)   [DISCONTINUED] rivaroxaban  (XARELTO ) 20 MG TABS tablet Take 1 tablet (20 mg total) by mouth daily with supper.   No facility-administered encounter medications on file as of 07/24/2024.    History: Past Medical History:  Diagnosis Date   GERD (gastroesophageal reflux disease)    Hyperlipidemia    Hypertension    Paroxysmal atrial fibrillation (HCC)    Partial tear of rotator cuff 06/27/2015   Post concussive encephalopathy 12/26/2013   Seizure after head injury (HCC) 12/14/2013   Shoulder pain, left    Past Surgical History:  Procedure Laterality Date   ATRIAL FIBRILLATION ABLATION N/A 09/07/2018   Procedure: ATRIAL FIBRILLATION ABLATION;  Surgeon: Kelsie Agent, MD;  Location: [facility];  Service: Cardiovascular;  Laterality: N/A;   BICEPS TENDON REPAIR     CATARACT EXTRACTION Bilateral    has to wear reading glasses after - feb 2024   COLONOSCOPY     knee scopes Bilateral    NASAL SEPTUM SURGERY     right inguinal hernia     child   SHOULDER ACROMIOPLASTY Left 06/27/2015   Procedure: SHOULDER ACROMIOPLASTY DEBRIDE LABRUM;  Surgeon: Fonda Olmsted, MD;  Location: [facility];  Service: Orthopedics;  Laterality: Left;   SHOULDER ARTHROSCOPY WITH ROTATOR CUFF REPAIR Left 06/27/2015   Procedure: SHOULDER ARTHROSCOPY WITH ROTATOR CUFF REPAIR;  Surgeon: Fonda Olmsted, MD;  Location: [facility];  Service: Orthopedics;  Laterality: Left;   TONSILECTOMY/ADENOIDECTOMY WITH MYRINGOTOMY     child   UPPER GASTROINTESTINAL ENDOSCOPY     Family History   Problem Relation Age of Onset   Cancer - Other Father        renal cell metastatic   CAD Father 61   Hypertension Brother        smoker   CAD Brother 85       stent, smoker   Colon cancer Neg Hx    Colon polyps Neg Hx    Esophageal cancer Neg Hx    Stomach cancer Neg Hx    Rectal cancer Neg Hx    Social History   Occupational History   Occupation: N/A  Tobacco Use   Smoking status: Never   Smokeless tobacco: Never  Vaping Use   Vaping status: Never Used  Substance and Sexual Activity   Alcohol use: Yes    Alcohol/week: 1.0 standard drink of alcohol    Types: 1 Glasses of wine per week    Comment: 1/2 glass a week   Drug use: Never   Sexual activity: Yes    Birth control/protection: Surgical   Tobacco Counseling Counseling given: Not Answered  SDOH Screenings   Food Insecurity: No Food Insecurity (07/23/2024)  Housing: Low Risk (07/23/2024)  Transportation Needs: No Transportation Needs (07/23/2024)  Utilities: Not At Risk (07/24/2024)  Alcohol Screen: Low Risk (07/23/2024)  Depression (PHQ2-9): Low Risk (07/24/2024)  Financial Resource Strain: Low Risk (07/23/2024)  Physical Activity: Sufficiently Active (07/23/2024)  Social Connections: Moderately Integrated (07/23/2024)  Stress: No Stress Concern Present (07/23/2024)  Tobacco Use: Low Risk (07/24/2024)  Health Literacy: Adequate Health Literacy (07/24/2024)   See flowsheets for full screening details  Depression Screen PHQ 2 & 9 Depression Scale- Over the past 2 weeks, how often have you been bothered by any of  the following problems? Little interest or pleasure in doing things: 0 Feeling down, depressed, or hopeless (PHQ Adolescent also includes...irritable): 0 PHQ-2 Total Score: 0 Trouble falling or staying asleep, or sleeping too much: 0 Feeling tired or having little energy: 0 Poor appetite or overeating (PHQ Adolescent also includes...weight loss): 0 Feeling bad about yourself - or that you are a  failure or have let yourself or your family down: 0 Trouble concentrating on things, such as reading the newspaper or watching television (PHQ Adolescent also includes...like school work): 3 Moving or speaking so slowly that other people could have noticed. Or the opposite - being so fidgety or restless that you have been moving around a lot more than usual: 0 Thoughts that you would be better off dead, or of hurting yourself in some way: 0 PHQ-9 Total Score: 3 If you checked off any problems, how difficult have these problems made it for you to do your work, take care of things at home, or get along with other people?: Somewhat difficult  Depression Treatment Depression Interventions/Treatment : EYV7-0 Score <4 Follow-up Not Indicated     Goals Addressed               This Visit's Progress     Patient Stated        stay healthy and active as I can (pt-stated)        Stay as healthy and active as I can              Objective:    Today's Vitals   07/24/24 1124  BP: 118/62  Weight: 164 lb (74.4 kg)  Height: 6' 1 (1.854 m)   Body mass index is 21.64 kg/m.  Hearing/Vision screen Hearing Screening - Comments:: Pt denies any hearing issues  Vision Screening - Comments:: Wears rx glasses - up to date with routine eye exams with Dr Octavia  Immunizations and Health Maintenance Health Maintenance  Topic Date Due   DTaP/Tdap/Td (3 - Td or Tdap) 12/08/2023   COVID-19 Vaccine (7 - 2025-26 season) 10/30/2024   Medicare Annual Wellness (AWV)  07/24/2025   Colonoscopy  06/18/2026   Pneumococcal Vaccine: 50+ Years  Completed   Influenza Vaccine  Completed   Hepatitis C Screening  Completed   Zoster Vaccines- Shingrix   Completed   Meningococcal B Vaccine  Aged Out        Assessment/Plan:  This is a routine wellness examination for [employer].  Patient Care Team: Katrinka Garnette KIDD, MD as PCP - General (Family Medicine) Lynwood Schilling, MD as PCP - Cardiology (Cardiology) Zelson,  Michael F, PhD (Inactive) as Consulting Physician (Psychology) Lynwood Schilling, MD as Consulting Physician (Cardiology)  I have personally reviewed and noted the following in the patients chart:   Medical and social history Use of alcohol, tobacco or illicit drugs  Current medications and supplements including opioid prescriptions. Functional ability and status Nutritional status Physical activity Advanced directives List of other physicians Hospitalizations, surgeries, and ER visits in previous 12 months Vitals Screenings to include cognitive, depression, and falls Referrals and appointments  No orders of the defined types were placed in this encounter.  In addition, I have reviewed and discussed with patient certain preventive protocols, quality metrics, and best practice recommendations. A written personalized care plan for preventive services as well as general preventive health recommendations were provided to patient.   Ellouise VEAR Haws, LPN   [id number]/83/7974   Return in about 1 year (around 07/29/2025).  After Visit Summary: (MyChart)  Due to this being a telephonic visit, the after visit summary with patients personalized plan was offered to patient via MyChart   Nurse Notes: No voiced or noted concerns at this time

## 2024-07-25 ENCOUNTER — Telehealth: Payer: Self-pay | Admitting: Pharmacist

## 2024-07-25 NOTE — Telephone Encounter (Signed)
 [facility]io enrollment form faex to [facility]

## 2024-08-16 ENCOUNTER — Other Ambulatory Visit

## 2024-08-16 DIAGNOSIS — Z006 Encounter for examination for normal comparison and control in clinical research program: Secondary | ICD-10-CM

## 2024-08-17 NOTE — Telephone Encounter (Signed)
 I reached out to Nicholas Perry to find out why we havent heard back about his benefits.

## 2024-08-20 ENCOUNTER — Other Ambulatory Visit: Payer: Self-pay | Admitting: Family Medicine

## 2024-08-23 ENCOUNTER — Encounter: Payer: Self-pay | Admitting: Cardiology

## 2024-08-24 ENCOUNTER — Other Ambulatory Visit: Payer: Self-pay | Admitting: Pharmacist

## 2024-08-26 LAB — GENECONNECT MOLECULAR SCREEN: Genetic Analysis Overall Interpretation: NEGATIVE

## 2024-08-27 ENCOUNTER — Telehealth: Payer: Self-pay | Admitting: Pharmacy Technician

## 2024-08-27 NOTE — Telephone Encounter (Signed)
 Dr. Lavona, Patient will be scheduled as soon as possible.  Auth Submission: NO AUTH NEEDED Site of care: Site of care: [facility] Payer: MEDICARE A/B & SUPP Medication & CPT/J Code(s) submitted: Leqvio (Inclisiran) J1306 Diagnosis Code: E78.5 Route of submission (phone, fax, portal):  Phone # Fax # Auth type: Buy/Bill PB Units/visits requested: 284MG  Q3MONTHS X2, THEN Q6 MONTHS Reference number:  Approval from: 08/27/24 to 09/08/25

## 2024-08-31 ENCOUNTER — Ambulatory Visit (HOSPITAL_COMMUNITY)
Admission: RE | Admit: 2024-08-31 | Discharge: 2024-08-31 | Disposition: A | Source: Ambulatory Visit | Attending: Cardiology

## 2024-08-31 VITALS — BP 127/78 | HR 70 | Temp 97.9°F | Resp 16

## 2024-08-31 DIAGNOSIS — I2510 Atherosclerotic heart disease of native coronary artery without angina pectoris: Secondary | ICD-10-CM | POA: Diagnosis present

## 2024-08-31 DIAGNOSIS — E785 Hyperlipidemia, unspecified: Secondary | ICD-10-CM | POA: Insufficient documentation

## 2024-08-31 MED ORDER — INCLISIRAN SODIUM 284 MG/1.5ML ~~LOC~~ SOSY
284.0000 mg | PREFILLED_SYRINGE | Freq: Once | SUBCUTANEOUS | Status: AC
  Administered 2024-08-31: 284 mg via SUBCUTANEOUS

## 2024-08-31 MED ORDER — INCLISIRAN SODIUM 284 MG/1.5ML ~~LOC~~ SOSY
PREFILLED_SYRINGE | SUBCUTANEOUS | Status: AC
  Filled 2024-08-31: qty 1.5

## 2024-09-13 ENCOUNTER — Encounter: Payer: Self-pay | Admitting: Family Medicine

## 2024-09-13 ENCOUNTER — Ambulatory Visit: Payer: Medicare Other | Admitting: Family Medicine

## 2024-09-13 ENCOUNTER — Ambulatory Visit: Payer: Self-pay | Admitting: Cardiology

## 2024-09-13 VITALS — BP 110/60 | HR 52 | Temp 97.8°F | Ht 73.0 in | Wt 167.2 lb

## 2024-09-13 DIAGNOSIS — Z131 Encounter for screening for diabetes mellitus: Secondary | ICD-10-CM

## 2024-09-13 DIAGNOSIS — Z125 Encounter for screening for malignant neoplasm of prostate: Secondary | ICD-10-CM

## 2024-09-13 DIAGNOSIS — R0602 Shortness of breath: Secondary | ICD-10-CM

## 2024-09-13 DIAGNOSIS — N401 Enlarged prostate with lower urinary tract symptoms: Secondary | ICD-10-CM

## 2024-09-13 DIAGNOSIS — R5383 Other fatigue: Secondary | ICD-10-CM

## 2024-09-13 DIAGNOSIS — I351 Nonrheumatic aortic (valve) insufficiency: Secondary | ICD-10-CM

## 2024-09-13 DIAGNOSIS — I480 Paroxysmal atrial fibrillation: Secondary | ICD-10-CM

## 2024-09-13 DIAGNOSIS — R1011 Right upper quadrant pain: Secondary | ICD-10-CM

## 2024-09-13 DIAGNOSIS — G720 Drug-induced myopathy: Secondary | ICD-10-CM

## 2024-09-13 DIAGNOSIS — R931 Abnormal findings on diagnostic imaging of heart and coronary circulation: Secondary | ICD-10-CM

## 2024-09-13 DIAGNOSIS — R944 Abnormal results of kidney function studies: Secondary | ICD-10-CM

## 2024-09-13 DIAGNOSIS — E785 Hyperlipidemia, unspecified: Secondary | ICD-10-CM

## 2024-09-13 DIAGNOSIS — I10 Essential (primary) hypertension: Secondary | ICD-10-CM

## 2024-09-13 DIAGNOSIS — R739 Hyperglycemia, unspecified: Secondary | ICD-10-CM

## 2024-09-13 LAB — COMPREHENSIVE METABOLIC PANEL WITH GFR
ALT: 27 U/L (ref 3–53)
AST: 25 U/L (ref 5–37)
Albumin: 4.5 g/dL (ref 3.5–5.2)
Alkaline Phosphatase: 50 U/L (ref 39–117)
BUN: 20 mg/dL (ref 6–23)
CO2: 35 meq/L — ABNORMAL HIGH (ref 19–32)
Calcium: 9.4 mg/dL (ref 8.4–10.5)
Chloride: 101 meq/L (ref 96–112)
Creatinine, Ser: 1.27 mg/dL (ref 0.40–1.50)
GFR: 56.53 mL/min — ABNORMAL LOW
Glucose, Bld: 93 mg/dL (ref 70–99)
Potassium: 4.5 meq/L (ref 3.5–5.1)
Sodium: 139 meq/L (ref 135–145)
Total Bilirubin: 0.9 mg/dL (ref 0.2–1.2)
Total Protein: 7.2 g/dL (ref 6.0–8.3)

## 2024-09-13 LAB — CBC WITH DIFFERENTIAL/PLATELET
Basophils Absolute: 0 10*3/uL (ref 0.0–0.1)
Basophils Relative: 0.5 % (ref 0.0–3.0)
Eosinophils Absolute: 0.1 10*3/uL (ref 0.0–0.7)
Eosinophils Relative: 1.3 % (ref 0.0–5.0)
HCT: 43.6 % (ref 39.0–52.0)
Hemoglobin: 14.5 g/dL (ref 13.0–17.0)
Lymphocytes Relative: 13.9 % (ref 12.0–46.0)
Lymphs Abs: 0.6 10*3/uL — ABNORMAL LOW (ref 0.7–4.0)
MCHC: 33.4 g/dL (ref 30.0–36.0)
MCV: 96.2 fl (ref 78.0–100.0)
Monocytes Absolute: 0.5 10*3/uL (ref 0.1–1.0)
Monocytes Relative: 12.2 % — ABNORMAL HIGH (ref 3.0–12.0)
Neutro Abs: 2.9 10*3/uL (ref 1.4–7.7)
Neutrophils Relative %: 72.1 % (ref 43.0–77.0)
Platelets: 155 10*3/uL (ref 150.0–400.0)
RBC: 4.53 Mil/uL (ref 4.22–5.81)
RDW: 13.6 % (ref 11.5–15.5)
WBC: 4 10*3/uL (ref 4.0–10.5)

## 2024-09-13 LAB — LIPID PANEL
Cholesterol: 148 mg/dL (ref 28–200)
HDL: 111.3 mg/dL
LDL Cholesterol: 27 mg/dL (ref 10–99)
NonHDL: 37.17
Total CHOL/HDL Ratio: 1
Triglycerides: 49 mg/dL (ref 10.0–149.0)
VLDL: 9.8 mg/dL (ref 0.0–40.0)

## 2024-09-13 LAB — MICROALBUMIN / CREATININE URINE RATIO
Creatinine,U: 142.2 mg/dL
Microalb Creat Ratio: UNDETERMINED mg/g (ref 0.0–30.0)
Microalb, Ur: <0.7 mg/dL

## 2024-09-13 LAB — URIC ACID: Uric Acid, Serum: 5.4 mg/dL (ref 4.0–7.8)

## 2024-09-13 LAB — TSH: TSH: 0.46 u[IU]/mL (ref 0.35–5.50)

## 2024-09-13 LAB — VITAMIN B12: Vitamin B-12: 327 pg/mL (ref 211–911)

## 2024-09-13 LAB — PSA, MEDICARE: PSA: 0.83 ng/mL (ref 0.10–4.00)

## 2024-09-13 LAB — BRAIN NATRIURETIC PEPTIDE: Pro B Natriuretic peptide (BNP): 89 pg/mL (ref 1.0–100.0)

## 2024-09-13 LAB — HIGH SENSITIVITY CRP: CRP, High Sensitivity: 0.84 mg/L (ref 0.200–5.000)

## 2024-09-13 LAB — HEMOGLOBIN A1C: Hgb A1c MFr Bld: 6 % (ref 4.6–6.5)

## 2024-09-13 NOTE — Progress Notes (Signed)
 " Phone [phone] In person visit   Subjective:   Nicholas Perry is a 74 y.o. year old very pleasant male patient who presents for/with See problem oriented charting Chief Complaint  Patient presents with   Annual Exam    Here for 1 year follow-up. Wants to discuss the right upper quad pain that he has been having. Had another episode. Wondering about an US .     Past Medical History-  Patient Active Problem List   Diagnosis Date Noted   Elevated coronary artery calcium  score 04/28/2017    Priority: High   Post concussive encephalopathy 12/26/2013    Priority: High   History of seizure- after head injury/electrocution 12/14/2013    Priority: High   Paroxysmal atrial fibrillation (HCC) 09/22/2009    Priority: High   Hyperglycemia 05/17/2022    Priority: Medium    History of adenomatous polyp of colon 06/26/2019    Priority: Medium    BPH (benign prostatic hyperplasia) 07/18/2014    Priority: Medium    Hyperlipidemia 01/02/2008    Priority: Medium    Essential hypertension 01/02/2008    Priority: Medium    Other fatigue 07/24/2018    Priority: Low   Coronary artery disease involving native coronary artery of native heart without angina pectoris 08/10/2020    Priority: 1.   Lumbar spinal stenosis 05/14/2019    Priority: 1.   Rheumatic aortic valve insufficiency 09/18/2022   MVP (mitral valve prolapse) 08/10/2020   Nonallopathic lesion of lumbosacral region 07/31/2019   Nonallopathic lesion of sacral region 07/31/2019   Nonallopathic lesion of thoracic region 07/31/2019   Nonrheumatic aortic valve insufficiency 07/07/2019   Partial tear of rotator cuff 06/27/2015    Medications- reviewed and updated Current Outpatient Medications  Medication Sig Dispense Refill   aspirin EC 81 MG tablet Take 1 tablet (81 mg total) by mouth daily. Swallow whole. 30 tablet 11   hydrochlorothiazide  (HYDRODIURIL ) 12.5 MG tablet TAKE 1 TABLET DAILY IN THE MORNING 90 tablet 3   inclisiran  (LEQVIO ) 284 MG/1.5ML SOSY injection Inject 284 mg into the skin every 6 (six) months.     losartan  (COZAAR ) 100 MG tablet TAKE 1 TABLET BY MOUTH EVERY DAY 90 tablet 3   rosuvastatin  (CRESTOR ) 20 MG tablet Take 1 tablet (20 mg total) by mouth daily. 90 tablet 3   ZETIA  10 MG tablet      No current facility-administered medications for this visit.     Objective:  BP 110/60 (BP Location: Left Arm, Patient Position: Sitting, Cuff Size: Normal)   Pulse (!) 52   Temp 97.8 F (36.6 C) (Temporal)   Ht 6' 1 (1.854 m)   Wt 167 lb 3.2 oz (75.8 kg)   SpO2 98%   BMI 22.06 kg/m  Gen: NAD, resting comfortably CV: bradycardia no murmurs rubs or gallops Lungs: CTAB no crackles, wheeze, rhonchi Abdomen: soft/nontender/nondistended/normal bowel sounds. No rebound or guarding.  Ext: no edema Skin: warm, dry Neuro: grossly normal, moves all extremities     Assessment and Plan     # Right upper quadrant pain  S: He had another episode the other day of RUQ pain- tylenol  seems to help within 20 minutes. Has several episodes over the years. Without medicine up to 4 hours or so.  A/P: at this point wen want to evaluate the gallbladder- previously he had wanted to hold off- refer today   #jittery episodes- has had some lows on home Dexcom in past and adjusted diet. Steel cut oats  switch last year for breakfast helped  #Mild fatigue issues/mild shortness of breath - will add TSH and B12- they do not think cardiac. Already takes vitamin D . Also getting BNP per his reported cardiology discussion  # Atrial fibrillation s/p ablation without recurrence.  Still with PVCs. Sees Dr. Lavona  S: Rate controlled with no rx.  cardizem  only as needed in past- not needing . Watch says 50% EKG but he has PVC's which could be throwing this off- no high heart rate thankfully. EKG generally sinus with aberations Anticoagulated with no rx- on asa only post ablation A/P: a fib after ablation without obvious  recurrence. He is not on OAC- they have him on aspirin only  -he reports cardiology wanted BNP and uric acid and HScrp he reports -we will order these and forward to Dr. Renita  #MVP- stable murmurand echocardiograms followed by cardiology-most recent echocardiogram February 2025 with aortic insufficiency moderate and mild mitral regurgitation with ejection fraction well-preserved -also aortic insuffiency and lpan is for MR angiogram this year   #hyperlipidemia S: Medication: zetia  10 mg daily, inclisiran every 6 months- thorough infusion center, rosuvastaitn 20 mg daily  -Prefer LDL under 70 due to coronary calcium  score of  204- 65% for age in 2020 -lipoprotein a not elevated Lab Results  Component Value Date   CHOL 196 02/14/2024   HDL 117 02/14/2024   LDLCALC 67 02/14/2024   LDLDIRECT 151.1 08/12/2009   TRIG 63 02/14/2024   CHOLHDL 1.7 02/14/2024  A/P: lipids looked at goal last time but we will update today as well with annual visit  #GFR mildly suppressed at times- hed like cystatin C- reasonable to check  #hypertension S: medication: losartan  100 mg and hydrochlorothiazide  12.5 mg daily A/P: well controlled continue current medications  -hed like to screne for hyperaldosteronism- will order though with his potassium being high normal on hydrochlorothiazide   I anticipate this being ok  #postconcussive encephalopathy. 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 Continue without meds for now   #mild AST- glass a week of wine. Likely not contributory- could be statin related.   #prior epididymitis - calmed down with antibiotic(s)   # Health  maintenance 1.  Healthy eating/regular exercise-tends to run in the 1 65-1 75 range. Walking dog regularly and gym still twice a week.  Wt Readings from Last 3 Encounters:  09/13/24 167 lb 3.2 oz (75.8 kg)  07/24/24 164 lb (74.4 kg)  10/26/23 162 lb 12.8 oz (73.8 kg)  2.  Immunizations- due for Tetanus, Diphtheria, and Pertussis (Tdap) at pharmacy  Immunization History  Administered Date(s) Administered   Fluad Quad(high Dose 65+) 04/18/2019, 05/12/2020, 04/23/2022   INFLUENZA, HIGH DOSE SEASONAL PF 05/04/2018, 04/15/2023, 05/02/2024   Influenza Split 04/15/2016   Influenza Whole 04/11/2009   Influenza,inj,Quad PF,6+ Mos 04/25/2017   Influenza-Unspecified 04/23/2014, 04/14/2021   PFIZER Comirnaty(Gray Top)Covid-19 Tri-Sucrose Vaccine 04/30/2022   PFIZER(Purple Top)SARS-COV-2 Vaccination 08/24/2019, 09/14/2019, 05/06/2020, 11/06/2020   Pfizer(Comirnaty)Fall Seasonal Vaccine 12 years and older 05/02/2024   Pneumococcal Conjugate-13 05/04/2018   Pneumococcal Polysaccharide-23 05/07/2019   Td 06/09/2009   Tdap 12/07/2013   Zoster Recombinant(Shingrix ) 05/31/2018, 09/27/2018   Zoster, Live 07/18/2014  3.  Prostate cancer screening-nocturia twice a night- check PSA today- mild increase last year- continue to trend Lab Results  Component Value Date   PSA 1.08 09/23/2023   PSA 0.64 05/17/2022   PSA 0.69 05/13/2021  4.  Colon cancer screening colonoscopy 06/19/2019 with 7-year repeat planned  5.  Skin cancer screening-sees dermatology regularly - just seen  Recommended follow up: Return in about 1 year (around 09/13/2025) for physical or sooner if needed.Schedule b4 you leave. Future Appointments  Date Time Provider Department Center  09/28/2024 10:00 AM [facility] 2 [facility]  11/16/2024  9:00 AM Lavona Agent, MD [facility]  11/30/2024  8:30 AM [facility] None  07/29/2025 10:40 AM [facility] ANNUAL WELLNESS VISIT 1 [facility]    Lab/Order associations:    ICD-10-CM   1. Essential hypertension  I10 Microalbumin / creatinine urine ratio    Aldosterone + renin activity w/ ratio    2. Paroxysmal atrial fibrillation (HCC)  I48.0 Brain natriuretic peptide    Uric acid    High sensitivity CRP    Aldosterone + renin activity w/ ratio    3. Hyperlipidemia, unspecified hyperlipidemia type  E78.5 Lipid panel    CBC with Differential/Platelet    Comprehensive metabolic panel    Uric acid    High sensitivity CRP    4. Elevated coronary artery calcium  score  R93.1 Lipid panel    CBC with Differential/Platelet    Comprehensive metabolic panel    Uric acid    High sensitivity CRP    5. Screening for prostate cancer  Z12.5 PSA, Medicare    6. Hyperglycemia  R73.9 Hemoglobin A1c    7. Screening for diabetes mellitus  Z13.1 Hemoglobin A1c    8. Benign prostatic hyperplasia with nocturia  N40.1    R35.1     9. Drug-induced myopathy  G72.0     10. Fatigue, unspecified type  R53.83 TSH    Vitamin B12    Brain natriuretic peptide    11. RUQ pain  R10.11 US  ABDOMEN LIMITED RUQ (LIVER/GB)    12. Decreased GFR  R94.4 Cystatin C with Glomerular Filtration Rate, Estimated (eGFR)    13. Aortic valve insufficiency, etiology of cardiac valve disease unspecified  I35.1 Brain natriuretic peptide    14. SOB (shortness of breath)  R06.02 Brain natriuretic peptide      No orders of the defined types were placed in this encounter.   Return precautions advised.  Garnette Lukes, MD  "

## 2024-09-13 NOTE — Patient Instructions (Addendum)
 Tetanus, Diphtheria, and Pertussis (Tdap) at pharmacy  We have placed a referral for right upper quadrant of abdomen ultrasound through [facility].  Their phone number is [phone].  Please call them if you have not heard in 1 week  Please stop by lab before you go If you have mychart- we will send your results within 3 business days of us  receiving them.  If you do not have mychart- we will call you about results within 5 business days of us  receiving them.  *please also note that you will see labs on mychart as soon as they post. I will later go in and write notes on them- will say notes from Dr. Katrinka    Recommended follow up: Return in about 1 year (around 09/13/2025) for physical or sooner if needed.Schedule b4 you leave.

## 2024-09-14 ENCOUNTER — Inpatient Hospital Stay: Admission: RE | Admit: 2024-09-14

## 2024-09-14 DIAGNOSIS — R1011 Right upper quadrant pain: Secondary | ICD-10-CM

## 2024-09-14 LAB — CYSTATIN C WITH GLOMERULAR FILTRATION RATE, ESTIMATED (EGFR)
CYSTATIN C: 1.02 mg/L (ref 0.52–1.16)
eGFR: 72 mL/min/{1.73_m2}

## 2024-09-14 NOTE — Telephone Encounter (Signed)
 FYI

## 2024-09-14 NOTE — Progress Notes (Signed)
 Spoke to patient directly. Complete understanding of labs. Had US  done and waiting for reading.

## 2024-09-28 ENCOUNTER — Ambulatory Visit (HOSPITAL_COMMUNITY)

## 2024-10-30 ENCOUNTER — Encounter (HOSPITAL_COMMUNITY)

## 2024-11-16 ENCOUNTER — Ambulatory Visit: Admitting: Cardiology

## 2024-11-30 ENCOUNTER — Encounter (HOSPITAL_COMMUNITY)

## 2025-07-29 ENCOUNTER — Ambulatory Visit

## 2025-09-16 ENCOUNTER — Ambulatory Visit: Admitting: Family Medicine
